# Patient Record
Sex: Female | Born: 1975 | Race: Black or African American | Hispanic: No | Marital: Single | Smoking: Former smoker
Health system: Southern US, Community
[De-identification: ages and names within clinical notes are randomized; demographics above are authoritative.]

## PROBLEM LIST (undated history)

## (undated) DIAGNOSIS — I10 Essential (primary) hypertension: Secondary | ICD-10-CM

## (undated) DIAGNOSIS — D649 Anemia, unspecified: Secondary | ICD-10-CM

## (undated) DIAGNOSIS — K449 Diaphragmatic hernia without obstruction or gangrene: Secondary | ICD-10-CM

## (undated) DIAGNOSIS — J189 Pneumonia, unspecified organism: Secondary | ICD-10-CM

## (undated) DIAGNOSIS — K219 Gastro-esophageal reflux disease without esophagitis: Secondary | ICD-10-CM

## (undated) DIAGNOSIS — F4310 Post-traumatic stress disorder, unspecified: Secondary | ICD-10-CM

## (undated) DIAGNOSIS — F32A Depression, unspecified: Secondary | ICD-10-CM

## (undated) DIAGNOSIS — D573 Sickle-cell trait: Secondary | ICD-10-CM

## (undated) DIAGNOSIS — M1990 Unspecified osteoarthritis, unspecified site: Secondary | ICD-10-CM

## (undated) HISTORY — PX: TUBAL LIGATION: SHX77

## (undated) HISTORY — DX: Gastro-esophageal reflux disease without esophagitis: K21.9

## (undated) HISTORY — PX: HERNIA REPAIR: SHX51

## (undated) HISTORY — PX: TONSILLECTOMY: SUR1361

## (undated) HISTORY — PX: SINUS EXPLORATION: SHX5214

## (undated) HISTORY — DX: Diaphragmatic hernia without obstruction or gangrene: K44.9

---

## 2001-10-03 HISTORY — PX: STOMACH SURGERY: SHX791

## 2011-07-06 ENCOUNTER — Emergency Department (INDEPENDENT_AMBULATORY_CARE_PROVIDER_SITE_OTHER): Payer: Self-pay

## 2011-07-06 ENCOUNTER — Encounter: Payer: Self-pay | Admitting: *Deleted

## 2011-07-06 ENCOUNTER — Emergency Department (HOSPITAL_BASED_OUTPATIENT_CLINIC_OR_DEPARTMENT_OTHER)
Admission: EM | Admit: 2011-07-06 | Discharge: 2011-07-06 | Disposition: A | Discharge status: Home / Self Care | Payer: Self-pay | Attending: Emergency Medicine | Admitting: Emergency Medicine

## 2011-07-06 DIAGNOSIS — J45909 Unspecified asthma, uncomplicated: Secondary | ICD-10-CM | POA: Insufficient documentation

## 2011-07-06 DIAGNOSIS — R05 Cough: Secondary | ICD-10-CM

## 2011-07-06 DIAGNOSIS — J3489 Other specified disorders of nose and nasal sinuses: Secondary | ICD-10-CM

## 2011-07-06 DIAGNOSIS — I10 Essential (primary) hypertension: Secondary | ICD-10-CM | POA: Insufficient documentation

## 2011-07-06 DIAGNOSIS — J40 Bronchitis, not specified as acute or chronic: Secondary | ICD-10-CM | POA: Insufficient documentation

## 2011-07-06 DIAGNOSIS — R079 Chest pain, unspecified: Secondary | ICD-10-CM | POA: Insufficient documentation

## 2011-07-06 HISTORY — DX: Sickle-cell trait: D57.3

## 2011-07-06 HISTORY — DX: Essential (primary) hypertension: I10

## 2011-07-06 MED ORDER — MOXIFLOXACIN HCL 400 MG PO TABS: 400.0000 mg | ORAL_TABLET | Freq: Every day | ORAL | Status: AC

## 2011-07-06 MED ORDER — ALBUTEROL SULFATE (5 MG/ML) 0.5% IN NEBU
2.5000 mg | INHALATION_SOLUTION | Freq: Once | RESPIRATORY_TRACT | Status: AC
  Administered 2011-07-06: 2.5 mg via RESPIRATORY_TRACT
  Filled 2011-07-06: qty 1

## 2011-07-06 MED ORDER — AMOXICILLIN 500 MG PO CAPS: 500.0000 mg | ORAL_CAPSULE | Freq: Three times a day (TID) | ORAL | Status: AC

## 2011-07-06 MED ORDER — IPRATROPIUM BROMIDE 0.02 % IN SOLN
0.5000 mg | Freq: Once | RESPIRATORY_TRACT | Status: AC
  Administered 2011-07-06: 0.5 mg via RESPIRATORY_TRACT
  Filled 2011-07-06: qty 2.5

## 2011-07-06 MED ORDER — ALBUTEROL SULFATE HFA 108 (90 BASE) MCG/ACT IN AERS: 2.0000 | INHALATION_SPRAY | RESPIRATORY_TRACT | Status: DC

## 2011-07-06 MED ORDER — ALBUTEROL SULFATE (5 MG/ML) 0.5% IN NEBU
2.5000 mg | INHALATION_SOLUTION | Freq: Once | RESPIRATORY_TRACT | Status: AC
  Administered 2011-07-06: 2.5 mg via RESPIRATORY_TRACT

## 2011-07-06 MED ORDER — ALBUTEROL SULFATE HFA 108 (90 BASE) MCG/ACT IN AERS
INHALATION_SPRAY | RESPIRATORY_TRACT | Status: AC
  Filled 2011-07-06: qty 6.7

## 2011-07-06 NOTE — ED Provider Notes (Signed)
History     CSN: [id number] Arrival date & time: 07/06/2011  7:13 PM  Chief Complaint  Patient presents with  . URI    (Consider location/radiation/quality/duration/timing/severity/associated sxs/prior treatment) Patient is a 35 y.o. female presenting with cough. The history is provided by the patient. No language interpreter was used.  Cough This is a new problem. The current episode started more than 1 week ago. The problem occurs constantly. The problem has been gradually worsening. The maximum temperature recorded prior to her arrival was 100 to 100.9 F. The fever has been present for 1 to 2 days. Associated symptoms include chest pain, rhinorrhea and sore throat. She has tried cough syrup for the symptoms. The treatment provided no relief. She is not a smoker. Her past medical history is significant for asthma. Her past medical history does not include pneumonia or emphysema.  Pt has been on prednisone x 3 days with no relief.  Pt has a history of asthma.  Past Medical History  Diagnosis Date  . Sickle cell trait   . Asthma   . Hypertension     Past Surgical History  Procedure Date  . Tubal ligation   . Hernia repair     History reviewed. No pertinent family history.  History  Substance Use Topics  . Smoking status: Never Smoker   . Smokeless tobacco: Not on file  . Alcohol Use: No    OB History    Grav Para Term Preterm Abortions TAB SAB Ect Mult Living                  Review of Systems  HENT: Positive for sore throat and rhinorrhea.   Respiratory: Positive for cough.   Cardiovascular: Positive for chest pain.  All other systems reviewed and are negative.    Allergies  Review of patient's allergies indicates no known allergies.  Home Medications   Current Outpatient Rx  Name Route Sig Dispense Refill  . ALBUTEROL SULFATE HFA 108 (90 BASE) MCG/ACT IN AERS Inhalation Inhale 2 puffs into the lungs every 6 (six) hours as needed. For shortness of breath  and wheezing     . CETIRIZINE HCL 10 MG PO TABS Oral Take 10 mg by mouth daily as needed.      Marland Kitchen HYDROCODONE-HOMATROPINE 5-1.5 MG/5ML PO SYRP Oral Take 5 mLs by mouth every 4 (four) hours as needed. For cough     . PHENYLEPHRINE-DM-GG-APAP 5-10-200-325 MG/10ML PO LIQD Oral Take 10 mLs by mouth every 6 (six) hours as needed. For cough     . PREDNISONE 20 MG PO TABS Oral Take 20 mg by mouth 3 (three) times daily.        BP 150/93  Pulse 82  Temp(Src) 98.5 F (36.9 C) (Oral)  Resp 16  Ht 5\' 6"  (1.676 m)  Wt 197 lb (89.359 kg)  BMI 31.80 kg/m2  SpO2 100%  LMP 06/01/2011  Physical Exam  Nursing note and vitals reviewed. Constitutional: She is oriented to person, place, and time. She appears well-developed and well-nourished.  HENT:  Head: Normocephalic and atraumatic.  Right Ear: External ear normal.  Left Ear: External ear normal.  Nose: Nose normal.  Mouth/Throat: Oropharynx is clear and moist.       Tender maxillary sinuses  Eyes: Conjunctivae and EOM are normal. Pupils are equal, round, and reactive to light.  Neck: Normal range of motion. Neck supple.  Cardiovascular: Normal rate.   Pulmonary/Chest: Effort normal and breath sounds normal.  Abdominal: Soft.  Musculoskeletal: Normal range of motion.  Neurological: She is alert and oriented to person, place, and time.  Skin: Skin is warm.  Psychiatric: She has a normal mood and affect.    ED Course  Procedures (including critical care time)  Labs Reviewed - No data to display Dg Chest 2 View  07/06/2011  *RADIOLOGY REPORT*  Clinical Data: Cough  CHEST - 2 VIEW  Comparison: None.  Findings: Cardiomediastinal silhouette is unremarkable.  No acute infiltrate or pleural effusion.  No pulmonary edema.  Bony thorax is unremarkable.  IMPRESSION: No active disease.  Original Report Authenticated By: Natasha Mead, M.D.     No diagnosis found.    MDM  No pneumonia.  I will treat with antibiotics,  Pt advised to continue cough  medication.        Langston Masker, [place] 07/06/11 2129

## 2011-07-06 NOTE — ED Provider Notes (Signed)
Medical screening examination/treatment/procedure(s) were performed by non-physician practitioner and as supervising physician I was immediately available for consultation/collaboration.   Shivaun Bilello, MD 07/06/11 2223 

## 2011-07-06 NOTE — ED Notes (Signed)
Pt c/o URI x 1 week . Seen by  [facility] ED x 3 dayss ago given steriods and cough meds, no relief

## 2011-09-19 ENCOUNTER — Encounter (HOSPITAL_BASED_OUTPATIENT_CLINIC_OR_DEPARTMENT_OTHER): Payer: Self-pay | Admitting: *Deleted

## 2011-09-19 ENCOUNTER — Emergency Department (HOSPITAL_BASED_OUTPATIENT_CLINIC_OR_DEPARTMENT_OTHER)
Admission: EM | Admit: 2011-09-19 | Discharge: 2011-09-19 | Disposition: A | Discharge status: Home / Self Care | Payer: Self-pay | Attending: Emergency Medicine | Admitting: Emergency Medicine

## 2011-09-19 DIAGNOSIS — N390 Urinary tract infection, site not specified: Secondary | ICD-10-CM

## 2011-09-19 DIAGNOSIS — N760 Acute vaginitis: Secondary | ICD-10-CM | POA: Insufficient documentation

## 2011-09-19 DIAGNOSIS — K2970 Gastritis, unspecified, without bleeding: Secondary | ICD-10-CM | POA: Insufficient documentation

## 2011-09-19 DIAGNOSIS — A499 Bacterial infection, unspecified: Secondary | ICD-10-CM | POA: Insufficient documentation

## 2011-09-19 DIAGNOSIS — R109 Unspecified abdominal pain: Secondary | ICD-10-CM | POA: Insufficient documentation

## 2011-09-19 DIAGNOSIS — A599 Trichomoniasis, unspecified: Secondary | ICD-10-CM | POA: Insufficient documentation

## 2011-09-19 DIAGNOSIS — B9689 Other specified bacterial agents as the cause of diseases classified elsewhere: Secondary | ICD-10-CM | POA: Insufficient documentation

## 2011-09-19 HISTORY — DX: Anemia, unspecified: D64.9

## 2011-09-19 LAB — URINALYSIS, ROUTINE W REFLEX MICROSCOPIC
Bilirubin Urine: NEGATIVE
Ketones, ur: NEGATIVE mg/dL
Nitrite: NEGATIVE
Urobilinogen, UA: 0.2 mg/dL (ref 0.0–1.0)

## 2011-09-19 LAB — WET PREP, GENITAL: Yeast Wet Prep HPF POC: NONE SEEN

## 2011-09-19 LAB — CBC
MCH: 23.3 pg — ABNORMAL LOW (ref 26.0–34.0)
MCHC: 32.2 g/dL (ref 30.0–36.0)
Platelets: 232 10*3/uL (ref 150–400)
RDW: 16.7 % — ABNORMAL HIGH (ref 11.5–15.5)

## 2011-09-19 MED ORDER — FAMOTIDINE 10 MG PO TABS: 10.0000 mg | ORAL_TABLET | Freq: Two times a day (BID) | ORAL | Status: DC

## 2011-09-19 MED ORDER — PROMETHAZINE HCL 25 MG PO TABS: 25.0000 mg | ORAL_TABLET | Freq: Four times a day (QID) | ORAL | Status: DC | PRN

## 2011-09-19 MED ORDER — AZITHROMYCIN 250 MG PO TABS
1000.0000 mg | ORAL_TABLET | Freq: Once | ORAL | Status: AC
  Administered 2011-09-19: 1000 mg via ORAL
  Filled 2011-09-19: qty 4

## 2011-09-19 MED ORDER — CEFTRIAXONE SODIUM 250 MG IJ SOLR
250.0000 mg | Freq: Once | INTRAMUSCULAR | Status: AC
  Administered 2011-09-19: 250 mg via INTRAMUSCULAR
  Filled 2011-09-19: qty 250

## 2011-09-19 MED ORDER — METRONIDAZOLE 500 MG PO TABS: 500.0000 mg | ORAL_TABLET | Freq: Two times a day (BID) | ORAL | Status: AC

## 2011-09-19 MED ORDER — CIPROFLOXACIN HCL 500 MG PO TABS: 500.0000 mg | ORAL_TABLET | Freq: Two times a day (BID) | ORAL | Status: AC

## 2011-09-19 MED ORDER — LIDOCAINE HCL (PF) 1 % IJ SOLN
INTRAMUSCULAR | Status: AC
  Administered 2011-09-19: 2 mL
  Filled 2011-09-19: qty 5

## 2011-09-19 NOTE — ED Notes (Signed)
Pt c/o abd pain and lower back and left flank pain

## 2011-09-19 NOTE — ED Provider Notes (Signed)
History     CSN: [id number] Arrival date & time: 09/19/2011  8:29 PM   First MD Initiated Contact with Patient 09/19/11 2053      Chief Complaint  Patient presents with  . Abdominal Pain  . Back Pain    (Consider location/radiation/quality/duration/timing/severity/associated sxs/prior treatment) HPI History provided by pt.   Pt has had intermittent epigastric pain w/ radiation to L flank for the past week.  Aggravated by certain movements.  Associated w/ nausea,  increased urinary freq and abn periods.  Denies fever, vomiting, diarrhea and other GU sx.   No recent trauma or heavy lifting. Drinks alcohol and takes NSAIDs infrequently but drinks a lot of caffeine.  H/o acid reflux and these sx have recently worsened. Past abd surgeries include tubal ligation.    Past Medical History  Diagnosis Date  . Sickle cell trait   . Asthma   . Hypertension   . Anemia     Past Surgical History  Procedure Date  . Tubal ligation   . Hernia repair   . Tonsillectomy     History reviewed. No pertinent family history.  History  Substance Use Topics  . Smoking status: Current Some Day Smoker  . Smokeless tobacco: Not on file  . Alcohol Use: Yes    OB History    Grav Para Term Preterm Abortions TAB SAB Ect Mult Living                  Review of Systems  All other systems reviewed and are negative.    Allergies  Review of patient's allergies indicates no known allergies.  Home Medications   Current Outpatient Rx  Name Route Sig Dispense Refill  . ALBUTEROL SULFATE HFA 108 (90 BASE) MCG/ACT IN AERS Inhalation Inhale 2 puffs into the lungs every 6 (six) hours as needed. For shortness of breath and wheezing     . CETIRIZINE HCL 10 MG PO TABS Oral Take 10 mg by mouth daily as needed. For allergies    . VITAMIN D 1000 UNITS PO TABS Oral Take 1,000 Units by mouth 2 (two) times daily.        BP 144/84  Pulse 96  Temp(Src) 98.3 F (36.8 C) (Oral)  Resp 18  Ht 5\' 6"  (1.676 m)   Wt 190 lb (86.183 kg)  BMI 30.67 kg/m2  SpO2 100%  LMP 08/17/2011  Physical Exam  Nursing note and vitals reviewed. Constitutional: She is oriented to person, place, and time. She appears well-developed and well-nourished. No distress.  HENT:  Head: Normocephalic and atraumatic.  Eyes:       Normal appearance  Neck: Normal range of motion.  Cardiovascular: Normal rate and regular rhythm.   Pulmonary/Chest: Effort normal and breath sounds normal.  Abdominal: Soft. Bowel sounds are normal. She exhibits no distension and no mass. There is no rebound and no guarding.       Mild, diffuse ttp; reported to be worst in epigastrium.  No CVA tenderness  Genitourinary:       Nml external genitalia.  Mod amt thick, Vides vaginal discharge.  No bleeding.  Cervix closed and appears nml.  Diffuse, mild tenderness on bimanual exam.   Neurological: She is alert and oriented to person, place, and time.  Skin: Skin is warm and dry. No rash noted.  Psychiatric: She has a normal mood and affect. Her behavior is normal.    ED Course  Procedures (including critical care time)  Labs Reviewed  URINALYSIS, ROUTINE  W REFLEX MICROSCOPIC - Abnormal; Notable for the following:    APPearance CLOUDY (*)    Leukocytes, UA MODERATE (*)    All other components within normal limits  URINE MICROSCOPIC-ADD ON - Abnormal; Notable for the following:    Squamous Epithelial / LPF FEW (*)    Bacteria, UA FEW (*)    All other components within normal limits  WET PREP, GENITAL - Abnormal; Notable for the following:    Trich, Wet Prep MANY (*)    Clue Cells, Wet Prep MANY (*)    WBC, Wet Prep HPF POC MODERATE (*)    All other components within normal limits  CBC - Abnormal; Notable for the following:    Hemoglobin 10.4 (*)    HCT 32.3 (*)    MCV 72.3 (*)    MCH 23.3 (*)    RDW 16.7 (*)    All other components within normal limits  PREGNANCY, URINE  GC/CHLAMYDIA PROBE AMP, GENITAL   No results found.   1.  Gastritis   2. Bacterial vaginosis   3. Trichomonas   4. UTI (lower urinary tract infection)       MDM  Pt presents w/ c/o epigastric pain w/ radiation to L flank, nausea and urinary frequency.  On exam, afebrile, NAD, abd benign but epigastric ttp, no CVA ttp, vaginal discharge and diffuse, mild tenderness on bimanual.  Labs sig for UTI, BV and trich.  Pyelo unlikely because pt afebrile, no vomiting and no CVA tenderness but pt d/c'd home w/ cipro (pt unable to afford macrobid).  Also prescribed flagyl, promethazine and pepcid.  She received IM rocephin and po zithromax in ED to cover for possible gc/chlam.  Doubt PID b/c no lower abd pain/tenderness, fever, vomiting. Return precautions discussed.         Otilio Miu, [place] 09/20/11 [phone]

## 2011-09-20 LAB — GC/CHLAMYDIA PROBE AMP, GENITAL
Chlamydia, DNA Probe: NEGATIVE
GC Probe Amp, Genital: NEGATIVE

## 2011-09-21 NOTE — ED Provider Notes (Signed)
Medical screening examination/treatment/procedure(s) were performed by non-physician practitioner and as supervising physician I was immediately available for consultation/collaboration.   Gerhard Munch, MD 09/21/11 1359

## 2012-10-05 ENCOUNTER — Emergency Department (HOSPITAL_BASED_OUTPATIENT_CLINIC_OR_DEPARTMENT_OTHER)
Admission: EM | Admit: 2012-10-05 | Discharge: 2012-10-06 | Disposition: A | Discharge status: Other Acute Inpatient Hospital | Payer: Self-pay | Attending: Emergency Medicine | Admitting: Emergency Medicine

## 2012-10-05 ENCOUNTER — Encounter (HOSPITAL_BASED_OUTPATIENT_CLINIC_OR_DEPARTMENT_OTHER): Payer: Self-pay | Admitting: *Deleted

## 2012-10-05 DIAGNOSIS — Z9851 Tubal ligation status: Secondary | ICD-10-CM | POA: Insufficient documentation

## 2012-10-05 DIAGNOSIS — Z3202 Encounter for pregnancy test, result negative: Secondary | ICD-10-CM | POA: Insufficient documentation

## 2012-10-05 DIAGNOSIS — J45909 Unspecified asthma, uncomplicated: Secondary | ICD-10-CM | POA: Insufficient documentation

## 2012-10-05 DIAGNOSIS — Z79899 Other long term (current) drug therapy: Secondary | ICD-10-CM | POA: Insufficient documentation

## 2012-10-05 DIAGNOSIS — F17200 Nicotine dependence, unspecified, uncomplicated: Secondary | ICD-10-CM | POA: Insufficient documentation

## 2012-10-05 DIAGNOSIS — Z862 Personal history of diseases of the blood and blood-forming organs and certain disorders involving the immune mechanism: Secondary | ICD-10-CM | POA: Insufficient documentation

## 2012-10-05 DIAGNOSIS — R110 Nausea: Secondary | ICD-10-CM | POA: Insufficient documentation

## 2012-10-05 DIAGNOSIS — R109 Unspecified abdominal pain: Secondary | ICD-10-CM | POA: Insufficient documentation

## 2012-10-05 DIAGNOSIS — I10 Essential (primary) hypertension: Secondary | ICD-10-CM | POA: Insufficient documentation

## 2012-10-05 DIAGNOSIS — D573 Sickle-cell trait: Secondary | ICD-10-CM | POA: Insufficient documentation

## 2012-10-05 DIAGNOSIS — N390 Urinary tract infection, site not specified: Secondary | ICD-10-CM | POA: Insufficient documentation

## 2012-10-05 MED ORDER — PIPERACILLIN-TAZOBACTAM 3.375 G IVPB 30 MIN
3.3750 g | Freq: Once | INTRAVENOUS | Status: AC
  Administered 2012-10-05: 3.375 g via INTRAVENOUS
  Filled 2012-10-05 (×2): qty 50

## 2012-10-05 MED ORDER — SODIUM CHLORIDE 0.9 % IV BOLUS (SEPSIS)
1000.0000 mL | Freq: Once | INTRAVENOUS | Status: AC
  Administered 2012-10-05: 500 mL via INTRAVENOUS

## 2012-10-05 MED ORDER — ONDANSETRON HCL 4 MG/2ML IJ SOLN
4.0000 mg | Freq: Once | INTRAMUSCULAR | Status: AC
  Administered 2012-10-05: 4 mg via INTRAVENOUS
  Filled 2012-10-05: qty 2

## 2012-10-05 NOTE — ED Notes (Signed)
Pt seen at [facility] 12/31 for UTI given bactrim , called today from [facility] to return  For new Abx , d/t urine culture results , pt stated she didn't want to wait at [facility] tonight

## 2012-10-06 LAB — URINE MICROSCOPIC-ADD ON

## 2012-10-06 LAB — URINALYSIS, ROUTINE W REFLEX MICROSCOPIC
Glucose, UA: NEGATIVE mg/dL
Ketones, ur: NEGATIVE mg/dL
Nitrite: NEGATIVE
Protein, ur: NEGATIVE mg/dL
Urobilinogen, UA: 0.2 mg/dL (ref 0.0–1.0)

## 2012-10-06 LAB — PREGNANCY, URINE: Preg Test, Ur: NEGATIVE

## 2012-10-06 NOTE — ED Notes (Signed)
I called [facility] and spoke with nursing supervisor Jasmine December, she stated that she had not heard from Dr. Heron Nay yet and would give [place] a call back when she talked with the doctor concerning a bed assignment for pt.

## 2012-10-06 NOTE — ED Notes (Addendum)
Recalled over to [facility] and spoke with nursing supervisor Sunday Shams about the status of bed assignment for pt. Stated" the MD just got done with a critical pt and she would take a look". RN/CN aware.

## 2012-10-06 NOTE — ED Provider Notes (Signed)
History     CSN: [id number]  Arrival date & time 10/05/12  2158   First MD Initiated Contact with Patient 10/05/12 2320      Chief Complaint  Patient presents with  . Urinary Tract Infection    (Consider location/radiation/quality/duration/timing/severity/associated sxs/prior treatment) The history is provided by the patient.  Renee Cross is a 37 y.o. female hx of sickle cell trait, HTN here with UTI. She has been feeling nauseous for several days. She also has lower abdominal pain that is intermittent. No flank pain or fever. Went to [facility] several days ago and the Urine culture showed resistant organism. She went to [facility] earlier today and had nl CBC. Still feeling nauseous but no vomiting. She came here after being called to return for IV abx.    Past Medical History  Diagnosis Date  . Sickle cell trait   . Asthma   . Hypertension   . Anemia     Past Surgical History  Procedure Date  . Tubal ligation   . Hernia repair   . Tonsillectomy     History reviewed. No pertinent family history.  History  Substance Use Topics  . Smoking status: Current Some Day Smoker -- 0.5 packs/day    Types: Cigarettes  . Smokeless tobacco: Not on file  . Alcohol Use: Yes    OB History    Grav Para Term Preterm Abortions TAB SAB Ect Mult Living                  Review of Systems  Gastrointestinal: Positive for nausea and abdominal pain.  All other systems reviewed and are negative.    Allergies  Review of patient's allergies indicates no known allergies.  Home Medications   Current Outpatient Rx  Name  Route  Sig  Dispense  Refill  . ALBUTEROL SULFATE HFA 108 (90 BASE) MCG/ACT IN AERS   Inhalation   Inhale 2 puffs into the lungs every 6 (six) hours as needed. For shortness of breath and wheezing          . CETIRIZINE HCL 10 MG PO TABS   Oral   Take 10 mg by mouth daily as needed. For allergies         . VITAMIN D 1000 UNITS PO TABS   Oral  Take 1,000 Units by mouth 2 (two) times daily.           Marland Kitchen FAMOTIDINE 10 MG PO TABS   Oral   Take 1 tablet (10 mg total) by mouth 2 (two) times daily. (for the first 3 days and then as needed)   20 tablet   0     BP 130/79  Pulse 75  Temp 98.1 F (36.7 C) (Oral)  Resp 16  Ht 5\' 6"  (1.676 m)  Wt 190 lb (86.183 kg)  BMI 30.67 kg/m2  SpO2 100%  LMP 10/03/2012  Physical Exam  Nursing note and vitals reviewed. Constitutional: She is oriented to person, place, and time. She appears well-developed and well-nourished.  HENT:  Head: Normocephalic.  Mouth/Throat: Oropharynx is clear and moist.  Eyes: Conjunctivae normal are normal. Pupils are equal, round, and reactive to light.  Neck: Normal range of motion. Neck supple.  Cardiovascular: Normal rate, regular rhythm and normal heart sounds.   Pulmonary/Chest: Breath sounds normal. No respiratory distress. She has no wheezes. She has no rales.  Abdominal: Soft.       Mild suprapubic tenderness, no rebound. No CVAT  Musculoskeletal: Normal range of motion.  Neurological: She is alert and oriented to person, place, and time.  Skin: Skin is warm and dry.  Psychiatric: She has a normal mood and affect. Her behavior is normal. Judgment and thought content normal.    ED Course  Procedures (including critical care time)  Labs Reviewed  URINALYSIS, ROUTINE W REFLEX MICROSCOPIC - Abnormal; Notable for the following:    Hgb urine dipstick MODERATE (*)     Leukocytes, UA LARGE (*)     All other components within normal limits  URINE MICROSCOPIC-ADD ON - Abnormal; Notable for the following:    Squamous Epithelial / LPF FEW (*)     Bacteria, UA MANY (*)     All other components within normal limits  PREGNANCY, URINE  URINE CULTURE   No results found.   No diagnosis found.    MDM  Renee Cross is a 37 y.o. female here with UTI. I reviewed the Urine culture from [facility] and the labs. Urine culture showed ESBL >  100,000 that is sensitive to zosyn. She is placed on zosyn. I called Dr. Heron Nay, who accepted the patient on med/surg at [facility].         Richardean Canal, MD 10/06/12 [phone]

## 2012-10-06 NOTE — ED Notes (Signed)
IV not actually removed. Pt transported out of the [facility] system with IV in place. IV removed in chart for charting purposes only.

## 2012-10-07 LAB — URINE CULTURE: Colony Count: 15000

## 2013-10-26 ENCOUNTER — Encounter (HOSPITAL_BASED_OUTPATIENT_CLINIC_OR_DEPARTMENT_OTHER): Payer: Self-pay | Admitting: Emergency Medicine

## 2013-10-26 ENCOUNTER — Emergency Department (HOSPITAL_BASED_OUTPATIENT_CLINIC_OR_DEPARTMENT_OTHER): Payer: Self-pay

## 2013-10-26 ENCOUNTER — Emergency Department (HOSPITAL_BASED_OUTPATIENT_CLINIC_OR_DEPARTMENT_OTHER)
Admission: EM | Admit: 2013-10-26 | Discharge: 2013-10-27 | Disposition: A | Discharge status: Home / Self Care | Payer: Self-pay | Attending: Emergency Medicine | Admitting: Emergency Medicine

## 2013-10-26 DIAGNOSIS — J111 Influenza due to unidentified influenza virus with other respiratory manifestations: Secondary | ICD-10-CM | POA: Insufficient documentation

## 2013-10-26 DIAGNOSIS — J45901 Unspecified asthma with (acute) exacerbation: Secondary | ICD-10-CM | POA: Insufficient documentation

## 2013-10-26 DIAGNOSIS — I10 Essential (primary) hypertension: Secondary | ICD-10-CM | POA: Insufficient documentation

## 2013-10-26 DIAGNOSIS — Z79899 Other long term (current) drug therapy: Secondary | ICD-10-CM | POA: Insufficient documentation

## 2013-10-26 DIAGNOSIS — Z862 Personal history of diseases of the blood and blood-forming organs and certain disorders involving the immune mechanism: Secondary | ICD-10-CM | POA: Insufficient documentation

## 2013-10-26 DIAGNOSIS — Z791 Long term (current) use of non-steroidal anti-inflammatories (NSAID): Secondary | ICD-10-CM | POA: Insufficient documentation

## 2013-10-26 DIAGNOSIS — F17200 Nicotine dependence, unspecified, uncomplicated: Secondary | ICD-10-CM | POA: Insufficient documentation

## 2013-10-26 LAB — CBC WITH DIFFERENTIAL/PLATELET
Basophils Absolute: 0 10*3/uL (ref 0.0–0.1)
Basophils Relative: 0 % (ref 0–1)
Eosinophils Absolute: 0.1 10*3/uL (ref 0.0–0.7)
Eosinophils Relative: 1 % (ref 0–5)
HCT: 34 % — ABNORMAL LOW (ref 36.0–46.0)
Hemoglobin: 11 g/dL — ABNORMAL LOW (ref 12.0–15.0)
Lymphocytes Relative: 20 % (ref 12–46)
Lymphs Abs: 1.5 10*3/uL (ref 0.7–4.0)
MCH: 24.4 pg — AB (ref 26.0–34.0)
MCHC: 32.4 g/dL (ref 30.0–36.0)
MCV: 75.4 fL — ABNORMAL LOW (ref 78.0–100.0)
MONO ABS: 0.8 10*3/uL (ref 0.1–1.0)
Monocytes Relative: 11 % (ref 3–12)
NEUTROS PCT: 68 % (ref 43–77)
Neutro Abs: 5 10*3/uL (ref 1.7–7.7)
PLATELETS: 226 10*3/uL (ref 150–400)
RBC: 4.51 MIL/uL (ref 3.87–5.11)
RDW: 17.4 % — ABNORMAL HIGH (ref 11.5–15.5)
WBC: 7.4 10*3/uL (ref 4.0–10.5)

## 2013-10-26 LAB — BASIC METABOLIC PANEL
BUN: 13 mg/dL (ref 6–23)
CALCIUM: 8.8 mg/dL (ref 8.4–10.5)
CO2: 21 mEq/L (ref 19–32)
Chloride: 103 mEq/L (ref 96–112)
Creatinine, Ser: 1 mg/dL (ref 0.50–1.10)
GFR calc Af Amer: 82 mL/min — ABNORMAL LOW (ref >90)
GFR, EST NON AFRICAN AMERICAN: 71 mL/min — AB (ref >90)
Glucose, Bld: 97 mg/dL (ref 70–99)
Potassium: 3.8 mEq/L (ref 3.7–5.3)
Sodium: 140 mEq/L (ref 137–147)

## 2013-10-26 MED ORDER — SODIUM CHLORIDE 0.9 % IV BOLUS (SEPSIS)
1000.0000 mL | Freq: Once | INTRAVENOUS | Status: AC
  Administered 2013-10-26: 1000 mL via INTRAVENOUS

## 2013-10-26 MED ORDER — KETOROLAC TROMETHAMINE 30 MG/ML IJ SOLN
30.0000 mg | Freq: Once | INTRAMUSCULAR | Status: AC
  Administered 2013-10-26: 30 mg via INTRAVENOUS
  Filled 2013-10-26: qty 1

## 2013-10-26 MED ORDER — ONDANSETRON HCL 4 MG/2ML IJ SOLN
4.0000 mg | Freq: Once | INTRAMUSCULAR | Status: AC
  Administered 2013-10-26: 4 mg via INTRAVENOUS
  Filled 2013-10-26: qty 2

## 2013-10-26 MED ORDER — SODIUM CHLORIDE 0.9 % IV SOLN: INTRAVENOUS | Status: DC

## 2013-10-26 MED ORDER — PROMETHAZINE HCL 25 MG PO TABS: 25.0000 mg | ORAL_TABLET | Freq: Four times a day (QID) | ORAL | Status: DC | PRN

## 2013-10-26 MED ORDER — NAPROXEN 500 MG PO TABS: 500.0000 mg | ORAL_TABLET | Freq: Two times a day (BID) | ORAL | Status: DC

## 2013-10-26 MED ORDER — DM-GUAIFENESIN ER 30-600 MG PO TB12: 1.0000 | ORAL_TABLET | Freq: Two times a day (BID) | ORAL | Status: DC

## 2013-10-26 MED ORDER — ALBUTEROL SULFATE HFA 108 (90 BASE) MCG/ACT IN AERS
2.0000 | INHALATION_SPRAY | Freq: Four times a day (QID) | RESPIRATORY_TRACT | Status: DC
  Administered 2013-10-27: 2 via RESPIRATORY_TRACT

## 2013-10-26 NOTE — Discharge Instructions (Signed)
Symptoms consistent with influenza-type symptoms. Beyond the window for affective numbness of Tamiflu. Patient is a Therapist, sports work note provided. Use the albuterol inhaler 2 puffs every 6 hours for the next 7 days as well to call. Take Mucinex DM every 12 hours as well for cough and the mucus. Take the Naprosyn for the bodyaches. Return for any new or worse symptoms. Rest and increase fluids is much as possible. Stay at home.

## 2013-10-26 NOTE — ED Notes (Signed)
Pt reports generalized body aches, vomiting, diarrhea x several days.

## 2013-10-26 NOTE — ED Provider Notes (Signed)
CSN: [id number]     Arrival date & time 10/26/13  1933 History  This chart was scribed for Shelda Jakes, MD by Ronal Fear, ED Scribe. This patient was seen in room MH10/MH10 and the patient's care was started at 11:45 PM.    Chief Complaint  Patient presents with  . Generalized Body Aches   (Consider location/radiation/quality/duration/timing/severity/associated sxs/prior Treatment) HPI HPI Comments: Renee Cross is a 38 y.o. female who presents to the Emergency Department complaining of sudden onset vomitting and diarrhea 2x days ago that started as body aches and cough. She took aleve and dayquil with no relief.  The symptoms got worse 1x day ago. She has vomited 3 times 1x day ago with 3 episodes of diarrhea with no blood. She vomited 2x today and 2 episodes of diarrhea. She also complains fo coughing that has grown worse over the past 2x days.   No PCP Per Patient  Past Medical History  Diagnosis Date  . Sickle cell trait   . Asthma   . Hypertension   . Anemia    Past Surgical History  Procedure Laterality Date  . Tubal ligation    . Hernia repair    . Tonsillectomy     History reviewed. No pertinent family history. History  Substance Use Topics  . Smoking status: Current Some Day Smoker -- 0.50 packs/day    Types: Cigarettes  . Smokeless tobacco: Not on file  . Alcohol Use: Yes   OB History   Grav Para Term Preterm Abortions TAB SAB Ect Mult Living                 Review of Systems  Constitutional: Positive for chills. Negative for fever.  HENT: Positive for congestion. Negative for rhinorrhea and sore throat.   Eyes: Negative for visual disturbance.  Respiratory: Positive for cough and shortness of breath.   Cardiovascular: Negative for chest pain.  Gastrointestinal: Positive for nausea, vomiting and diarrhea. Negative for abdominal pain.  Genitourinary: Negative for dysuria.  Musculoskeletal: Positive for myalgias. Negative for joint swelling.  Skin:  Negative for rash.  Neurological: Positive for headaches.  Hematological: Does not bruise/bleed easily.  Psychiatric/Behavioral: Negative for confusion.  All other systems reviewed and are negative.    Allergies  Review of patient's allergies indicates no known allergies.  Home Medications   Current Outpatient Rx  Name  Route  Sig  Dispense  Refill  . albuterol (PROVENTIL HFA;VENTOLIN HFA) 108 (90 BASE) MCG/ACT inhaler   Inhalation   Inhale 2 puffs into the lungs every 6 (six) hours as needed. For shortness of breath and wheezing          . cetirizine (ZYRTEC) 10 MG tablet   Oral   Take 10 mg by mouth daily as needed. For allergies         . cholecalciferol (VITAMIN D) 1000 UNITS tablet   Oral   Take 1,000 Units by mouth 2 (two) times daily.           Marland Kitchen dextromethorphan-guaiFENesin (MUCINEX DM) 30-600 MG per 12 hr tablet   Oral   Take 1 tablet by mouth 2 (two) times daily.   28 tablet   0   . EXPIRED: famotidine (PEPCID AC) 10 MG tablet   Oral   Take 1 tablet (10 mg total) by mouth 2 (two) times daily. (for the first 3 days and then as needed)   20 tablet   0   . naproxen (NAPROSYN) 500 MG tablet  Oral   Take 1 tablet (500 mg total) by mouth 2 (two) times daily.   14 tablet   0   . EXPIRED: promethazine (PHENERGAN) 25 MG tablet   Oral   Take 1 tablet (25 mg total) by mouth every 6 (six) hours as needed for nausea.   20 tablet   0   . promethazine (PHENERGAN) 25 MG tablet   Oral   Take 1 tablet (25 mg total) by mouth every 6 (six) hours as needed for nausea or vomiting.   20 tablet   0    BP 145/87  Pulse 105  Temp(Src) 98.5 F (36.9 C) (Oral)  Resp 18  Ht 5\' 6"  (1.676 m)  Wt 195 lb (88.451 kg)  BMI 31.49 kg/m2  SpO2 100% Physical Exam  Nursing note and vitals reviewed. Constitutional: She is oriented to person, place, and time. She appears well-developed and well-nourished. No distress.  HENT:  Head: Normocephalic and atraumatic.  Eyes:  EOM are normal.  Neck: Neck supple. No tracheal deviation present.  Cardiovascular: Normal rate, regular rhythm and normal heart sounds.   Pulmonary/Chest: Effort normal and breath sounds normal. No respiratory distress.  Abdominal: Soft. Bowel sounds are normal. There is no tenderness.  Musculoskeletal: Normal range of motion. She exhibits no edema.  Neurological: She is alert and oriented to person, place, and time. No cranial nerve deficit. She exhibits normal muscle tone. Coordination normal.  Skin: Skin is warm and dry.  Psychiatric: She has a normal mood and affect. Her behavior is normal.    ED Course  Procedures (including critical care time)  DIAGNOSTIC STUDIES: Oxygen Saturation is 100% on RA, normal by my interpretation.    COORDINATION OF CARE: 10:58 PM- Pt advised of plan for treatment including anti emetics and pain medication with chest X-ray and pt agrees.    Labs Review Labs Reviewed  CBC WITH DIFFERENTIAL - Abnormal; Notable for the following:    Hemoglobin 11.0 (*)    HCT 34.0 (*)    MCV 75.4 (*)    MCH 24.4 (*)    RDW 17.4 (*)    All other components within normal limits  BASIC METABOLIC PANEL   Results for orders placed during the hospital encounter of 10/26/13  CBC WITH DIFFERENTIAL      Result Value Range   WBC PENDING  4.0 - 10.5 K/uL   RBC 4.51  3.87 - 5.11 MIL/uL   Hemoglobin 11.0 (*) 12.0 - 15.0 g/dL   HCT 10/28/13 (*) 25.4 - 27.0 %   MCV 75.4 (*) 78.0 - 100.0 fL   MCH 24.4 (*) 26.0 - 34.0 pg   MCHC 32.4  30.0 - 36.0 g/dL   RDW 62.3 (*) 76.2 - 83.1 %   Platelets PENDING  150 - 400 K/uL   Neutrophils Relative % PENDING  43 - 77 %   Neutro Abs PENDING  1.7 - 7.7 K/uL   Band Neutrophils PENDING  0 - 10 %   Lymphocytes Relative PENDING  12 - 46 %   Lymphs Abs PENDING  0.7 - 4.0 K/uL   Monocytes Relative PENDING  3 - 12 %   Monocytes Absolute PENDING  0.1 - 1.0 K/uL   Eosinophils Relative PENDING  0 - 5 %   Eosinophils Absolute PENDING  0.0 - 0.7  K/uL   Basophils Relative PENDING  0 - 1 %   Basophils Absolute PENDING  0.0 - 0.1 K/uL   WBC Morphology PENDING  RBC Morphology PENDING     Smear Review PENDING     nRBC PENDING  0 /100 WBC   Metamyelocytes Relative PENDING     Myelocytes PENDING     Promyelocytes Absolute PENDING     Blasts PENDING      Imaging Review No results found.  EKG Interpretation   None       MDM   1. Influenza    Symptoms consistent with influenza. Patient is beyond the window for Tamiflu to be effective. Patient nontoxic no acute distress. Patient with fluid hydration here. Chest x-ray pending. Basic labs ordered for baseline. Patient will be given albuterol inhaler 2 take home and use for the next 7 days. Also prescription for Mucinex DM. Prescription for Naprosyn. Prescription for Phenergan for the nausea and vomiting. Patient given precautions. 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    I personally performed the services described in this documentation, which was scribed in my presence. The recorded information has been reviewed and is accurate.    Shelda Jakes, MD 10/26/13 [phone]

## 2013-10-26 NOTE — ED Provider Notes (Signed)
Nursing notes and vitals signs, including pulse oximetry, reviewed.  Summary of this visit's results, reviewed by myself:  Labs:  Results for orders placed during the hospital encounter of 10/26/13 (from the past 24 hour(s))  CBC WITH DIFFERENTIAL     Status: Abnormal   Collection Time    10/26/13 11:20 PM      Result Value Range   WBC 7.4  4.0 - 10.5 K/uL   RBC 4.51  3.87 - 5.11 MIL/uL   Hemoglobin 11.0 (*) 12.0 - 15.0 g/dL   HCT 33.8 (*) 25.0 - 53.9 %   MCV 75.4 (*) 78.0 - 100.0 fL   MCH 24.4 (*) 26.0 - 34.0 pg   MCHC 32.4  30.0 - 36.0 g/dL   RDW 76.7 (*) 34.1 - 93.7 %   Platelets 226  150 - 400 K/uL   Neutrophils Relative % 68  43 - 77 %   Lymphocytes Relative 20  12 - 46 %   Monocytes Relative 11  3 - 12 %   Eosinophils Relative 1  0 - 5 %   Basophils Relative 0  0 - 1 %   Neutro Abs 5.0  1.7 - 7.7 K/uL   Lymphs Abs 1.5  0.7 - 4.0 K/uL   Monocytes Absolute 0.8  0.1 - 1.0 K/uL   Eosinophils Absolute 0.1  0.0 - 0.7 K/uL   Basophils Absolute 0.0  0.0 - 0.1 K/uL   RBC Morphology POLYCHROMASIA PRESENT     WBC Morphology Kimmey COUNT CONFIRMED ON SMEAR     Smear Review LARGE PLATELETS PRESENT    BASIC METABOLIC PANEL     Status: Abnormal   Collection Time    10/26/13 11:20 PM      Result Value Range   Sodium 140  137 - 147 mEq/L   Potassium 3.8  3.7 - 5.3 mEq/L   Chloride 103  96 - 112 mEq/L   CO2 21  19 - 32 mEq/L   Glucose, Bld 97  70 - 99 mg/dL   BUN 13  6 - 23 mg/dL   Creatinine, Ser 9.02  0.50 - 1.10 mg/dL   Calcium 8.8  8.4 - 40.9 mg/dL   GFR calc non Af Amer 71 (*) >90 mL/min   GFR calc Af Amer 82 (*) >90 mL/min    Imaging Studies: Dg Chest 2 View  10/26/2013   CLINICAL DATA:  Sudden onset of vomiting and diarrhea  EXAM: CHEST  2 VIEW  COMPARISON:  07/06/2011  FINDINGS: The heart size and mediastinal contours are within normal limits. Both lungs are clear. The visualized skeletal structures are unremarkable.  IMPRESSION: No active cardiopulmonary disease.    Electronically Signed   By: Signa Kell M.D.   On: 10/26/2013 23:48      Hanley Seamen, MD 10/26/13 2357

## 2013-10-27 MED ORDER — ALBUTEROL SULFATE HFA 108 (90 BASE) MCG/ACT IN AERS
INHALATION_SPRAY | RESPIRATORY_TRACT | Status: AC
  Administered 2013-10-27: 2 via RESPIRATORY_TRACT
  Filled 2013-10-27: qty 6.7

## 2014-03-28 ENCOUNTER — Encounter (HOSPITAL_BASED_OUTPATIENT_CLINIC_OR_DEPARTMENT_OTHER): Payer: Self-pay | Admitting: Emergency Medicine

## 2014-03-28 ENCOUNTER — Emergency Department (HOSPITAL_BASED_OUTPATIENT_CLINIC_OR_DEPARTMENT_OTHER): Payer: Self-pay

## 2014-03-28 ENCOUNTER — Emergency Department (HOSPITAL_BASED_OUTPATIENT_CLINIC_OR_DEPARTMENT_OTHER)
Admission: EM | Admit: 2014-03-28 | Discharge: 2014-03-28 | Disposition: A | Discharge status: Other Acute Inpatient Hospital | Payer: Self-pay | Attending: Emergency Medicine | Admitting: Emergency Medicine

## 2014-03-28 DIAGNOSIS — J45909 Unspecified asthma, uncomplicated: Secondary | ICD-10-CM | POA: Insufficient documentation

## 2014-03-28 DIAGNOSIS — A5900 Urogenital trichomoniasis, unspecified: Secondary | ICD-10-CM | POA: Insufficient documentation

## 2014-03-28 DIAGNOSIS — R1084 Generalized abdominal pain: Secondary | ICD-10-CM | POA: Insufficient documentation

## 2014-03-28 DIAGNOSIS — F17200 Nicotine dependence, unspecified, uncomplicated: Secondary | ICD-10-CM | POA: Insufficient documentation

## 2014-03-28 DIAGNOSIS — Z3202 Encounter for pregnancy test, result negative: Secondary | ICD-10-CM | POA: Insufficient documentation

## 2014-03-28 DIAGNOSIS — N12 Tubulo-interstitial nephritis, not specified as acute or chronic: Secondary | ICD-10-CM | POA: Insufficient documentation

## 2014-03-28 DIAGNOSIS — D72829 Elevated white blood cell count, unspecified: Secondary | ICD-10-CM | POA: Insufficient documentation

## 2014-03-28 DIAGNOSIS — I10 Essential (primary) hypertension: Secondary | ICD-10-CM | POA: Insufficient documentation

## 2014-03-28 DIAGNOSIS — Z862 Personal history of diseases of the blood and blood-forming organs and certain disorders involving the immune mechanism: Secondary | ICD-10-CM | POA: Insufficient documentation

## 2014-03-28 DIAGNOSIS — IMO0001 Reserved for inherently not codable concepts without codable children: Secondary | ICD-10-CM | POA: Insufficient documentation

## 2014-03-28 LAB — CBC WITH DIFFERENTIAL/PLATELET
BASOS ABS: 0 10*3/uL (ref 0.0–0.1)
BASOS PCT: 0 % (ref 0–1)
Eosinophils Absolute: 0.1 10*3/uL (ref 0.0–0.7)
Eosinophils Relative: 1 % (ref 0–5)
HCT: 29.3 % — ABNORMAL LOW (ref 36.0–46.0)
Hemoglobin: 9.6 g/dL — ABNORMAL LOW (ref 12.0–15.0)
Lymphocytes Relative: 9 % — ABNORMAL LOW (ref 12–46)
Lymphs Abs: 1.1 10*3/uL (ref 0.7–4.0)
MCH: 24.4 pg — ABNORMAL LOW (ref 26.0–34.0)
MCHC: 32.8 g/dL (ref 30.0–36.0)
MCV: 74.4 fL — ABNORMAL LOW (ref 78.0–100.0)
MONO ABS: 0.9 10*3/uL (ref 0.1–1.0)
Monocytes Relative: 8 % (ref 3–12)
NEUTROS ABS: 9.6 10*3/uL — AB (ref 1.7–7.7)
Neutrophils Relative %: 82 % — ABNORMAL HIGH (ref 43–77)
Platelets: 244 10*3/uL (ref 150–400)
RBC: 3.94 MIL/uL (ref 3.87–5.11)
RDW: 17.8 % — AB (ref 11.5–15.5)
WBC: 11.6 10*3/uL — ABNORMAL HIGH (ref 4.0–10.5)

## 2014-03-28 LAB — URINALYSIS, ROUTINE W REFLEX MICROSCOPIC
Bilirubin Urine: NEGATIVE
GLUCOSE, UA: NEGATIVE mg/dL
Ketones, ur: NEGATIVE mg/dL
Nitrite: NEGATIVE
PH: 7.5 (ref 5.0–8.0)
Protein, ur: NEGATIVE mg/dL
SPECIFIC GRAVITY, URINE: 1.012 (ref 1.005–1.030)
Urobilinogen, UA: 1 mg/dL (ref 0.0–1.0)

## 2014-03-28 LAB — COMPREHENSIVE METABOLIC PANEL
ALBUMIN: 3.8 g/dL (ref 3.5–5.2)
ALT: 28 U/L (ref 0–35)
AST: 31 U/L (ref 0–37)
Alkaline Phosphatase: 86 U/L (ref 39–117)
BUN: 9 mg/dL (ref 6–23)
CO2: 25 mEq/L (ref 19–32)
CREATININE: 0.9 mg/dL (ref 0.50–1.10)
Calcium: 9 mg/dL (ref 8.4–10.5)
Chloride: 103 mEq/L (ref 96–112)
GFR calc Af Amer: >90 mL/min (ref >90)
GFR calc non Af Amer: 81 mL/min — ABNORMAL LOW (ref >90)
Glucose, Bld: 107 mg/dL — ABNORMAL HIGH (ref 70–99)
POTASSIUM: 4.1 meq/L (ref 3.7–5.3)
Sodium: 140 mEq/L (ref 137–147)
Total Bilirubin: 0.4 mg/dL (ref 0.3–1.2)
Total Protein: 7.2 g/dL (ref 6.0–8.3)

## 2014-03-28 LAB — URINE MICROSCOPIC-ADD ON

## 2014-03-28 LAB — LIPASE, BLOOD: Lipase: 44 U/L (ref 11–59)

## 2014-03-28 LAB — PREGNANCY, URINE: PREG TEST UR: NEGATIVE

## 2014-03-28 LAB — RPR

## 2014-03-28 LAB — HIV ANTIBODY (ROUTINE TESTING W REFLEX): HIV 1&2 Ab, 4th Generation: NONREACTIVE

## 2014-03-28 MED ORDER — METOCLOPRAMIDE HCL 5 MG/ML IJ SOLN
10.0000 mg | Freq: Once | INTRAMUSCULAR | Status: AC
  Administered 2014-03-28: 10 mg via INTRAVENOUS
  Filled 2014-03-28: qty 2

## 2014-03-28 MED ORDER — MORPHINE SULFATE 4 MG/ML IJ SOLN
4.0000 mg | Freq: Once | INTRAMUSCULAR | Status: AC
  Administered 2014-03-28: 4 mg via INTRAVENOUS
  Filled 2014-03-28: qty 1

## 2014-03-28 MED ORDER — PIPERACILLIN-TAZOBACTAM 3.375 G IVPB 30 MIN
3.3750 g | Freq: Once | INTRAVENOUS | Status: AC
  Administered 2014-03-28: 3.375 g via INTRAVENOUS
  Filled 2014-03-28 (×2): qty 50

## 2014-03-28 MED ORDER — ACETAMINOPHEN 325 MG PO TABS
650.0000 mg | ORAL_TABLET | Freq: Once | ORAL | Status: AC
  Administered 2014-03-28: 650 mg via ORAL
  Filled 2014-03-28: qty 2

## 2014-03-28 MED ORDER — ONDANSETRON HCL 4 MG/2ML IJ SOLN
4.0000 mg | Freq: Once | INTRAMUSCULAR | Status: AC
  Administered 2014-03-28: 4 mg via INTRAVENOUS
  Filled 2014-03-28: qty 2

## 2014-03-28 MED ORDER — SODIUM CHLORIDE 0.9 % IV BOLUS (SEPSIS)
1000.0000 mL | Freq: Once | INTRAVENOUS | Status: AC
  Administered 2014-03-28: 1000 mL via INTRAVENOUS

## 2014-03-28 MED ORDER — IOHEXOL 300 MG/ML  SOLN
50.0000 mL | Freq: Once | INTRAMUSCULAR | Status: AC | PRN
  Administered 2014-03-28: 50 mL via ORAL

## 2014-03-28 MED ORDER — IOHEXOL 300 MG/ML  SOLN
100.0000 mL | Freq: Once | INTRAMUSCULAR | Status: AC | PRN
  Administered 2014-03-28: 100 mL via INTRAVENOUS

## 2014-03-28 NOTE — ED Provider Notes (Signed)
CSN: [id number]     Arrival date & time 03/28/14  1008 History   First MD Initiated Contact with Patient 03/28/14 1013     Chief Complaint  Patient presents with  . Emesis     (Consider location/radiation/quality/duration/timing/severity/associated sxs/prior Treatment) HPI Comments: Patient presents to the ER for evaluation of abdominal pain. Patient reports that she started to notice lower back pain yesterday. Since then she has developed generalized myalgias, headache, feeling feverish with diffuse abdominal pain, nausea and vomiting. She has not had any diarrhea. Pain is crampy and although her abdomen.  Patient is a 38 y.o. female presenting with abdominal pain.  Abdominal Pain Associated symptoms: nausea and vomiting     Past Medical History  Diagnosis Date  . Sickle cell trait   . Asthma   . Hypertension   . Anemia    Past Surgical History  Procedure Laterality Date  . Tubal ligation    . Hernia repair    . Tonsillectomy     No family history on file. History  Substance Use Topics  . Smoking status: Current Some Day Smoker -- 0.50 packs/day    Types: Cigarettes  . Smokeless tobacco: Not on file  . Alcohol Use: Yes   OB History   Grav Para Term Preterm Abortions TAB SAB Ect Mult Living                 Review of Systems  Gastrointestinal: Positive for nausea, vomiting and abdominal pain.  Musculoskeletal: Positive for back pain and myalgias.  Neurological: Positive for headaches.  All other systems reviewed and are negative.     Allergies  Review of patient's allergies indicates no known allergies.  Home Medications   Prior to Admission medications   Medication Sig Start Date End Date Taking? Authorizing Provider  albuterol (PROVENTIL HFA;VENTOLIN HFA) 108 (90 BASE) MCG/ACT inhaler Inhale 2 puffs into the lungs every 6 (six) hours as needed. For shortness of breath and wheezing     Historical Provider, MD  cetirizine (ZYRTEC) 10 MG tablet Take 10 mg by  mouth daily as needed. For allergies    Historical Provider, MD   BP 160/72  Pulse 75  Temp(Src) 102.1 F (38.9 C) (Oral)  Resp 18  SpO2 100%  LMP 03/21/2014 Physical Exam  Constitutional: She is oriented to person, place, and time. She appears well-developed and well-nourished. No distress.  HENT:  Head: Normocephalic and atraumatic.  Right Ear: Hearing normal.  Left Ear: Hearing normal.  Nose: Nose normal.  Mouth/Throat: Oropharynx is clear and moist and mucous membranes are normal.  Eyes: Conjunctivae and EOM are normal. Pupils are equal, round, and reactive to light.  Neck: Normal range of motion. Neck supple.  Cardiovascular: Regular rhythm, S1 normal and S2 normal.  Exam reveals no gallop and no friction rub.   No murmur heard. Pulmonary/Chest: Effort normal and breath sounds normal. No respiratory distress. She exhibits no tenderness.  Abdominal: Soft. Normal appearance and bowel sounds are normal. There is no hepatosplenomegaly. There is generalized tenderness. There is no rebound, no guarding, no tenderness at McBurney's point and negative Murphy's sign. No hernia.  Genitourinary: Vagina normal. There is no rash or tenderness on the right labia. There is no rash or tenderness on the left labia. Cervix exhibits no motion tenderness.  Musculoskeletal: Normal range of motion.  Neurological: She is alert and oriented to person, place, and time. She has normal strength. No cranial nerve deficit or sensory deficit. Coordination normal. GCS eye  subscore is 4. GCS verbal subscore is 5. GCS motor subscore is 6.  Skin: Skin is warm, dry and intact. No rash noted. No cyanosis.  Psychiatric: She has a normal mood and affect. Her speech is normal and behavior is normal. Thought content normal.    ED Course  Procedures (including critical care time) Labs Review Labs Reviewed  CBC WITH DIFFERENTIAL - Abnormal; Notable for the following:    WBC 11.6 (*)    Hemoglobin 9.6 (*)    HCT 29.3  (*)    MCV 74.4 (*)    MCH 24.4 (*)    RDW 17.8 (*)    Neutrophils Relative % 82 (*)    Neutro Abs 9.6 (*)    Lymphocytes Relative 9 (*)    All other components within normal limits  COMPREHENSIVE METABOLIC PANEL - Abnormal; Notable for the following:    Glucose, Bld 107 (*)    GFR calc non Af Amer 81 (*)    All other components within normal limits  URINALYSIS, ROUTINE W REFLEX MICROSCOPIC - Abnormal; Notable for the following:    APPearance CLOUDY (*)    Hgb urine dipstick TRACE (*)    Leukocytes, UA SMALL (*)    All other components within normal limits  URINE MICROSCOPIC-ADD ON - Abnormal; Notable for the following:    Squamous Epithelial / LPF FEW (*)    Bacteria, UA MANY (*)    All other components within normal limits  URINE CULTURE  GC/CHLAMYDIA PROBE AMP  CULTURE, BLOOD (ROUTINE X 2)  CULTURE, BLOOD (ROUTINE X 2)  LIPASE, BLOOD  PREGNANCY, URINE  RPR  HIV ANTIBODY (ROUTINE TESTING)    Imaging Review Ct Abdomen Pelvis W Contrast  03/28/2014   ADDENDUM REPORT: 03/28/2014 12:33  ADDENDUM: This study was compared to a prior CT of the abdomen and pelvis dated 10/08/2012.   Electronically Signed   By: Salome Holmes M.D.   On: 03/28/2014 12:33   03/28/2014   CLINICAL DATA:  abd pain  EXAM: CT ABDOMEN AND PELVIS WITH CONTRAST  TECHNIQUE: Multidetector CT imaging of the abdomen and pelvis was performed using the standard protocol following bolus administration of intravenous contrast.  CONTRAST:  55mL OMNIPAQUE IOHEXOL 300 MG/ML SOLN, OMNIPAQUE IOHEXOL 300 MG/ML SOLN  COMPARISON:  None.  FINDINGS: Lung bases are unremarkable.  The liver, spleen, adrenals, pancreas, left kidney are unremarkable. A nonenhancing 2.5 cm simple cyst right kidney. Right kidney otherwise unremarkable.  No abdominal aortic aneurysm. The celiac, SMA, IMA, portal vein, SMV are opacified.  No abdominal or pelvic masses, loculated fluid collections, nor adenopathy. Small amount of free fluid in the  dependent portion of the pelvis. No abdominal free fluid.  No abdominal wall or inguinal hernia. No aggressive appearing osseous lesions.  The bowel is negative.  Gallbladder fossa is negative.  IMPRESSION: Small amount of free fluid within the dependent portion the pelvis likely physiologic.  Benign Bosniak type 1 cyst right kidney.  Otherwise no evidence of abdominal or pelvic pathology.  Electronically Signed: By: Salome Holmes M.D. On: 03/28/2014 12:25     EKG Interpretation None      MDM   Final diagnoses:  Pyelonephritis  Trichomoniasis, urogenital   Patient presents to the ER for evaluation of fever, chills, back pain, abdominal pain. Patient does not appear septic upon arrival to the ER. She has a mild leukocytosis, normal comprehensive panel. Patient was complaining of lower abdominal discomfort, examination revealed diffuse tenderness. She did not have  focal tenderness in the right upper quadrant, LFTs are normal. Low suspicion for gallbladder disease. Urinalysis to suggest infection, however.  CT scan performed and does not show any acute pathology. Reviewing the patient's records reveals that she does have a history of UTI/pyelonephritis in January 2014. At that time she was diagnosed with urinary tract infection and initiated on outpatient treatment. Cultures ultimately showed very resistant Escherichia coli and she ended up hospitalized for 4 or 5 days at [facility] on IV Zosyn. The patient's urinalysis appears similar today to what she showed a time. A culture of the urine has been sent. Blood culture has been sent.   Patient's urine did show trichomonas in addition to sign of infection. Based on this, pelvic exam was performed to rule out PID as a cause for her fever and acute illness. She did not have any significant discharge and there was no cervical motion tenderness. GC and Chlamydia pending. PID felt to be unlikely based on the exam.  Based on the history of  resistant pathogens, Zosyn were initiated and patient will be admitted to [facility] for further management.    Gilda Crease, MD 03/28/14 [phone]

## 2014-03-28 NOTE — ED Notes (Signed)
Generalized aches and pains, fever, nausea and vomiting for two days.  Some dysuria.

## 2014-03-28 NOTE — ED Notes (Signed)
Bed assignment received-HRP 775-report called to [facility] at [facility]

## 2014-03-28 NOTE — ED Notes (Signed)
bld cx x 2 drawn prior to IV abx started

## 2014-03-29 LAB — GC/CHLAMYDIA PROBE AMP
CT PROBE, AMP APTIMA: NEGATIVE
GC Probe RNA: NEGATIVE

## 2014-03-31 LAB — URINE CULTURE: Colony Count: >=100000

## 2014-04-01 ENCOUNTER — Telehealth (HOSPITAL_COMMUNITY): Payer: Self-pay

## 2014-04-01 ENCOUNTER — Telehealth (HOSPITAL_BASED_OUTPATIENT_CLINIC_OR_DEPARTMENT_OTHER): Payer: Self-pay | Admitting: Emergency Medicine

## 2014-04-03 LAB — CULTURE, BLOOD (ROUTINE X 2)
Culture: NO GROWTH
Culture: NO GROWTH

## 2015-06-15 ENCOUNTER — Encounter (HOSPITAL_BASED_OUTPATIENT_CLINIC_OR_DEPARTMENT_OTHER): Payer: Self-pay | Admitting: *Deleted

## 2015-06-15 ENCOUNTER — Emergency Department (HOSPITAL_BASED_OUTPATIENT_CLINIC_OR_DEPARTMENT_OTHER)
Admission: EM | Admit: 2015-06-15 | Discharge: 2015-06-15 | Disposition: A | Discharge status: Home / Self Care | Payer: Self-pay | Attending: Emergency Medicine | Admitting: Emergency Medicine

## 2015-06-15 DIAGNOSIS — Z862 Personal history of diseases of the blood and blood-forming organs and certain disorders involving the immune mechanism: Secondary | ICD-10-CM | POA: Insufficient documentation

## 2015-06-15 DIAGNOSIS — Z720 Tobacco use: Secondary | ICD-10-CM | POA: Insufficient documentation

## 2015-06-15 DIAGNOSIS — I10 Essential (primary) hypertension: Secondary | ICD-10-CM | POA: Insufficient documentation

## 2015-06-15 DIAGNOSIS — J45901 Unspecified asthma with (acute) exacerbation: Secondary | ICD-10-CM | POA: Insufficient documentation

## 2015-06-15 MED ORDER — PREDNISONE 20 MG PO TABS: 20.0000 mg | ORAL_TABLET | Freq: Two times a day (BID) | ORAL | Status: DC

## 2015-06-15 MED ORDER — PREDNISONE 50 MG PO TABS
60.0000 mg | ORAL_TABLET | Freq: Once | ORAL | Status: AC
  Administered 2015-06-15: 60 mg via ORAL
  Filled 2015-06-15 (×2): qty 1

## 2015-06-15 MED ORDER — ALBUTEROL (5 MG/ML) CONTINUOUS INHALATION SOLN
10.0000 mg/h | INHALATION_SOLUTION | Freq: Once | RESPIRATORY_TRACT | Status: AC
  Administered 2015-06-15: 10 mg/h via RESPIRATORY_TRACT
  Filled 2015-06-15: qty 20

## 2015-06-15 MED ORDER — ALBUTEROL SULFATE HFA 108 (90 BASE) MCG/ACT IN AERS: 1.0000 | INHALATION_SPRAY | Freq: Four times a day (QID) | RESPIRATORY_TRACT | Status: DC | PRN

## 2015-06-15 MED ORDER — CETIRIZINE HCL 10 MG PO TABS: 10.0000 mg | ORAL_TABLET | Freq: Every day | ORAL | Status: DC

## 2015-06-15 MED ORDER — IBUPROFEN 800 MG PO TABS
800.0000 mg | ORAL_TABLET | Freq: Once | ORAL | Status: AC
  Administered 2015-06-15: 800 mg via ORAL
  Filled 2015-06-15: qty 1

## 2015-06-15 NOTE — ED Notes (Signed)
10 mg Albuterol CAT completed

## 2015-06-15 NOTE — ED Notes (Signed)
Sob since last night. Hx of asthma. She has used her inhaler with no relief. States she feels she used it to much.

## 2015-06-15 NOTE — Discharge Instructions (Signed)

## 2015-06-15 NOTE — ED Provider Notes (Signed)
CSN: [id number]     Arrival date & time 06/15/15  1152 History   First MD Initiated Contact with Patient 06/15/15 1203     Chief Complaint  Patient presents with  . Shortness of Breath      HPI  Patient presents for evaluation of asthma. States she is short of breath since yesterday. Using her Qvar at home with minimal improvement or relief. Feels tight. Frequent cough. No fevers. No chest pain.  Past Medical History  Diagnosis Date  . Sickle cell trait   . Asthma   . Hypertension   . Anemia    Past Surgical History  Procedure Laterality Date  . Tubal ligation    . Hernia repair    . Tonsillectomy     No family history on file. Social History  Substance Use Topics  . Smoking status: Current Some Day Smoker -- 0.50 packs/day    Types: Cigarettes  . Smokeless tobacco: None  . Alcohol Use: Yes   OB History    No data available     Review of Systems  Constitutional: Negative for fever, chills, diaphoresis, appetite change and fatigue.  HENT: Negative for mouth sores, sore throat and trouble swallowing.   Eyes: Negative for visual disturbance.  Respiratory: Positive for chest tightness and wheezing. Negative for cough and shortness of breath.   Cardiovascular: Negative for chest pain.  Gastrointestinal: Negative for nausea, vomiting, abdominal pain, diarrhea and abdominal distention.  Endocrine: Negative for polydipsia, polyphagia and polyuria.  Genitourinary: Negative for dysuria, frequency and hematuria.  Musculoskeletal: Negative for gait problem.  Skin: Negative for color change, pallor and rash.  Neurological: Negative for dizziness, syncope, light-headedness and headaches.  Hematological: Does not bruise/bleed easily.  Psychiatric/Behavioral: Negative for behavioral problems and confusion.      Allergies  Review of patient's allergies indicates no known allergies.  Home Medications   Prior to Admission medications   Medication Sig Start Date End Date  Taking? Authorizing Provider  albuterol (PROVENTIL HFA;VENTOLIN HFA) 108 (90 BASE) MCG/ACT inhaler Inhale 1-2 puffs into the lungs every 6 (six) hours as needed for wheezing. 06/15/15   Rolland Porter, MD  cetirizine (ZYRTEC) 10 MG tablet Take 1 tablet (10 mg total) by mouth daily. 1 po q day prn allergies 06/15/15   Rolland Porter, MD  predniSONE (DELTASONE) 20 MG tablet Take 1 tablet (20 mg total) by mouth 2 (two) times daily with a meal. 06/15/15   Rolland Porter, MD   BP 140/78 mmHg  Pulse 82  Temp(Src) 97.7 F (36.5 C) (Oral)  Resp 20  Ht 5\' 6"  (1.676 m)  Wt 187 lb 5 oz (84.964 kg)  BMI 30.25 kg/m2  SpO2 100%  LMP 06/14/2015 Physical Exam  Constitutional: She is oriented to person, place, and time. She appears well-developed and well-nourished. No distress.  HENT:  Head: Normocephalic.  Eyes: Conjunctivae are normal. Pupils are equal, round, and reactive to light. No scleral icterus.  Neck: Normal range of motion. Neck supple. No thyromegaly present.  Cardiovascular: Normal rate and regular rhythm.  Exam reveals no gallop and no friction rub.   No murmur heard. Pulmonary/Chest: Effort normal. No respiratory distress. She has decreased breath sounds. She has wheezes. She has no rales.  Prolongation wheezing globally diminished breath sounds in all fields.  Abdominal: Soft. Bowel sounds are normal. She exhibits no distension. There is no tenderness. There is no rebound.  Musculoskeletal: Normal range of motion.  Neurological: She is alert and oriented to person, place,  and time.  Skin: Skin is warm and dry. No rash noted.  Psychiatric: She has a normal mood and affect. Her behavior is normal.    ED Course  Procedures (including critical care time) Labs Review Labs Reviewed - No data to display  Imaging Review No results found. I have personally reviewed and evaluated these images and lab results as part of my medical decision-making.   EKG Interpretation None      MDM   Final  diagnoses:  Asthma exacerbation    On continuous nebulizer, PO prednisone. Reevaluation.  CRITICAL CARE Performed by: Rolland Porter JOSEPH   Total critical care time: 60 minutes of continuous nebulized albuterol treatment.  Critical care time was exclusive of separately billable procedures and treating other patients.  Critical care was necessary to treat or prevent imminent or life-threatening deterioration.  Critical care was time spent personally by me on the following activities: development of treatment plan with patient and/or surrogate as well as nursing, discussions with consultants, evaluation of patient's response to treatment, examination of patient, obtaining history from patient or surrogate, ordering and performing treatments and interventions, ordering and review of laboratory studies, ordering and review of radiographic studies, pulse oximetry and re-evaluation of patient's condition.   13:40:  Patient received 1 hour nebulized albuterol. Has been off for approximately 30 minutes. Reexamination she has clear lungs. States she feels better. Well oxygenated at 98%. Heart rate 93. Able toward without recurrence of symptoms or hypoxemia. Plan is home, POC Channing Mutters, albuterol MDI, primary care follow-up. ER with acute changes.    Rolland Porter, MD 06/15/15 1341

## 2015-07-20 ENCOUNTER — Encounter (HOSPITAL_BASED_OUTPATIENT_CLINIC_OR_DEPARTMENT_OTHER): Payer: Self-pay | Admitting: Emergency Medicine

## 2015-07-20 ENCOUNTER — Emergency Department (HOSPITAL_BASED_OUTPATIENT_CLINIC_OR_DEPARTMENT_OTHER)
Admission: EM | Admit: 2015-07-20 | Discharge: 2015-07-20 | Disposition: A | Discharge status: Home / Self Care | Payer: Self-pay | Attending: Emergency Medicine | Admitting: Emergency Medicine

## 2015-07-20 ENCOUNTER — Emergency Department (HOSPITAL_BASED_OUTPATIENT_CLINIC_OR_DEPARTMENT_OTHER): Payer: Self-pay

## 2015-07-20 DIAGNOSIS — I10 Essential (primary) hypertension: Secondary | ICD-10-CM | POA: Insufficient documentation

## 2015-07-20 DIAGNOSIS — Z7951 Long term (current) use of inhaled steroids: Secondary | ICD-10-CM | POA: Insufficient documentation

## 2015-07-20 DIAGNOSIS — Z79899 Other long term (current) drug therapy: Secondary | ICD-10-CM | POA: Insufficient documentation

## 2015-07-20 DIAGNOSIS — Z862 Personal history of diseases of the blood and blood-forming organs and certain disorders involving the immune mechanism: Secondary | ICD-10-CM | POA: Insufficient documentation

## 2015-07-20 DIAGNOSIS — Z720 Tobacco use: Secondary | ICD-10-CM | POA: Insufficient documentation

## 2015-07-20 DIAGNOSIS — J209 Acute bronchitis, unspecified: Secondary | ICD-10-CM

## 2015-07-20 DIAGNOSIS — J4521 Mild intermittent asthma with (acute) exacerbation: Secondary | ICD-10-CM | POA: Insufficient documentation

## 2015-07-20 MED ORDER — IPRATROPIUM BROMIDE 0.02 % IN SOLN
0.5000 mg | Freq: Once | RESPIRATORY_TRACT | Status: AC
  Administered 2015-07-20: 0.5 mg via RESPIRATORY_TRACT
  Filled 2015-07-20: qty 2.5

## 2015-07-20 MED ORDER — GUAIFENESIN-CODEINE 100-10 MG/5ML PO SOLN: 5.0000 mL | Freq: Three times a day (TID) | ORAL | Status: DC | PRN

## 2015-07-20 MED ORDER — AZITHROMYCIN 250 MG PO TABS
250.0000 mg | ORAL_TABLET | Freq: Every day | ORAL | Status: DC
Start: 2015-07-20 — End: 2015-09-25

## 2015-07-20 MED ORDER — ALBUTEROL SULFATE (2.5 MG/3ML) 0.083% IN NEBU
5.0000 mg | INHALATION_SOLUTION | Freq: Once | RESPIRATORY_TRACT | Status: AC
  Administered 2015-07-20: 5 mg via RESPIRATORY_TRACT
  Filled 2015-07-20: qty 6

## 2015-07-20 MED ORDER — PREDNISONE 50 MG PO TABS
60.0000 mg | ORAL_TABLET | Freq: Once | ORAL | Status: AC
  Administered 2015-07-20: 60 mg via ORAL
  Filled 2015-07-20 (×2): qty 1

## 2015-07-20 MED ORDER — PREDNISONE 20 MG PO TABS: 40.0000 mg | ORAL_TABLET | Freq: Every day | ORAL | Status: DC

## 2015-07-20 MED ORDER — ALBUTEROL SULFATE HFA 108 (90 BASE) MCG/ACT IN AERS
2.0000 | INHALATION_SPRAY | Freq: Once | RESPIRATORY_TRACT | Status: AC
  Administered 2015-07-20: 2 via RESPIRATORY_TRACT
  Filled 2015-07-20: qty 6.7

## 2015-07-20 MED ORDER — BENZONATATE 100 MG PO CAPS
200.0000 mg | ORAL_CAPSULE | Freq: Once | ORAL | Status: AC
  Administered 2015-07-20: 200 mg via ORAL
  Filled 2015-07-20: qty 2

## 2015-07-20 NOTE — ED Notes (Signed)
Pa  at bedside. 

## 2015-07-20 NOTE — ED Notes (Signed)
Pt having runny nose, cough, sputum yellow.  Headache, vomiting after coughing.  Started one week ago.

## 2015-07-20 NOTE — ED Provider Notes (Signed)
CSN: [id number]     Arrival date & time 07/20/15  1031 History   First MD Initiated Contact with Patient 07/20/15 1111     Chief Complaint  Patient presents with  . URI     (Consider location/radiation/quality/duration/timing/severity/associated sxs/prior Treatment) HPI    Patient is a 39 year old female, current smoker, with PMHx of asthma, hypertension, anemia and sickle cell trait, she presents to the emergency room with approximately 1 week of increasing runny nose, sneezing, cough with productive yellow sputum, headache, chills and sweats and post tussive emesis.  She has had to use her inhalers multiple times per day, more in the morning and at night due to increasing shortness of breath. She has tried over-the-counter Mucinex decongestant without any improvement.  She denies any chest pain, abdominal pain, diarrhea, fever, sick contacts.  Past Medical History  Diagnosis Date  . Sickle cell trait (HCC)   . Asthma   . Hypertension   . Anemia    Past Surgical History  Procedure Laterality Date  . Tubal ligation    . Hernia repair    . Tonsillectomy     No family history on file. Social History  Substance Use Topics  . Smoking status: Current Some Day Smoker -- 0.50 packs/day    Types: Cigarettes  . Smokeless tobacco: None  . Alcohol Use: Yes   OB History    No data available     Review of Systems  Constitutional: Negative for activity change and appetite change.  HENT: Positive for congestion. Negative for sneezing, sore throat and trouble swallowing.   Eyes: Negative.  Negative for discharge.  Respiratory: Positive for cough, chest tightness and shortness of breath. Negative for choking and wheezing.   Cardiovascular: Negative.  Negative for chest pain, palpitations and leg swelling.  Gastrointestinal: Negative for nausea, abdominal pain, diarrhea, constipation, blood in stool, abdominal distention and rectal pain.  Genitourinary: Negative.   Musculoskeletal:  Negative.   Skin: Negative.  Negative for color change and rash.  Neurological: Negative.  Negative for syncope, weakness, light-headedness and headaches.  Psychiatric/Behavioral: Negative.       Allergies  Review of patient's allergies indicates no known allergies.  Home Medications   Prior to Admission medications   Medication Sig Start Date End Date Taking? Authorizing Provider  beclomethasone (QVAR) 40 MCG/ACT inhaler Inhale 2 puffs into the lungs 2 (two) times daily.   Yes Historical Provider, MD  albuterol (PROVENTIL HFA;VENTOLIN HFA) 108 (90 BASE) MCG/ACT inhaler Inhale 1-2 puffs into the lungs every 6 (six) hours as needed for wheezing. 06/15/15   Rolland Porter, MD  azithromycin (ZITHROMAX) 250 MG tablet Take 1 tablet (250 mg total) by mouth daily. Take first 2 tablets together, then 1 every day until finished. 07/20/15   Danelle Berry, PA-C  cetirizine (ZYRTEC) 10 MG tablet Take 1 tablet (10 mg total) by mouth daily. 1 po q day prn allergies 06/15/15   Rolland Porter, MD  guaiFENesin-codeine 100-10 MG/5ML syrup Take 5 mLs by mouth 3 (three) times daily as needed for cough. 07/20/15   Danelle Berry, PA-C  predniSONE (DELTASONE) 20 MG tablet Take 2 tablets (40 mg total) by mouth daily. 07/20/15   Taline Nass, PA-C   BP 130/76 mmHg  Pulse 74  Temp(Src) 98.3 F (36.8 C) (Oral)  Resp 20  Ht 5\' 6"  (1.676 m)  Wt 186 lb (84.369 kg)  BMI 30.04 kg/m2  SpO2 100%  LMP 07/18/2015 Physical Exam  Constitutional: She is oriented to person, place, and  time. She appears well-developed and well-nourished. No distress.  HENT:  Head: Normocephalic and atraumatic.  Clear nasal discharge with mildly erythematous nasal mucosa, posterior oropharynx mildly edematous, no exudate, no edema, uvula midline  Eyes: Conjunctivae and EOM are normal. Pupils are equal, round, and reactive to light. Right eye exhibits no discharge. Left eye exhibits no discharge. No scleral icterus.  Neck: Normal range of motion. No  JVD present. No tracheal deviation present. No thyromegaly present.  Cardiovascular: Normal rate, regular rhythm, normal heart sounds and intact distal pulses.  Exam reveals no gallop and no friction rub.   No murmur heard. Pulmonary/Chest: Effort normal. No respiratory distress. She has wheezes. She has no rales. She exhibits no tenderness.  Decreased BS bilaterally at the bases, faint exp wheeze bilaterally in upper lung fields, frequent cough, no accessory muscle use, to increased work of breathing, no cyanosis  Abdominal: Soft. Bowel sounds are normal. She exhibits no distension and no mass. There is no tenderness. There is no rebound and no guarding.  Musculoskeletal: Normal range of motion. She exhibits no edema or tenderness.  Lymphadenopathy:    She has no cervical adenopathy.  Neurological: She is alert and oriented to person, place, and time. She has normal reflexes. No cranial nerve deficit. She exhibits normal muscle tone. Coordination normal.  Skin: Skin is warm and dry. No rash noted. She is not diaphoretic. No erythema. No pallor.  Psychiatric: She has a normal mood and affect. Her behavior is normal. Judgment and thought content normal.  Nursing note and vitals reviewed.   ED Course  Procedures (including critical care time) Labs Review Labs Reviewed - No data to display  Imaging Review Dg Chest 2 View  07/20/2015  CLINICAL DATA:  Shortness of breath, cough, and chest discomfort since yesterday. EXAM: CHEST  2 VIEW COMPARISON:  10/26/2013 FINDINGS: The heart size and mediastinal contours are within normal limits. Both lungs are clear. The visualized skeletal structures are unremarkable. IMPRESSION: Negative.  No active cardiopulmonary disease. Electronically Signed   By: Myles Rosenthal M.D.   On: 07/20/2015 12:53   I have personally reviewed and evaluated these images and lab results as part of my medical decision-making.   EKG Interpretation None      MDM   Final  diagnoses:  Acute bronchitis, unspecified organism  Asthma exacerbation attacks, mild intermittent    Pt with cough and URI sx for > 1 week, has been using inhaler more frequently, some productive sputum and chest tightness  Pt wheeze cleared after breathing treatment, breath sounds improved at the bases.  She also noted significant improvement of her chest tightness and cough with treatment received in the ER.  She was given a oral dose of steroids as well. And was discharged home with steroid burst, cough syrup and azithromycin to treat for acute bronchitis with comorbidy asthma.  She was given strict return precautions, patient verbalized understanding. She is also discharged home with a work note.    While in the ER on room air the patient maintained oxygen saturation 100%, and was discharged home in stable condition.     Danelle Berry, PA-C 07/21/15 1657  Gwyneth Sprout, MD 07/22/15 [id number]

## 2015-07-20 NOTE — Discharge Instructions (Signed)
Acute Bronchitis Bronchitis is inflammation of the airways that extend from the windpipe into the lungs (bronchi). The inflammation often causes mucus to develop. This leads to a cough, which is the most common symptom of bronchitis.  In acute bronchitis, the condition usually develops suddenly and goes away over time, usually in a couple weeks. Smoking, allergies, and asthma can make bronchitis worse. Repeated episodes of bronchitis may cause further lung problems.  CAUSES Acute bronchitis is most often caused by the same virus that causes a cold. The virus can spread from person to person (contagious) through coughing, sneezing, and touching contaminated objects. SIGNS AND SYMPTOMS   Cough.   Fever.   Coughing up mucus.   Body aches.   Chest congestion.   Chills.   Shortness of breath.   Sore throat.  DIAGNOSIS  Acute bronchitis is usually diagnosed through a physical exam. Your health care provider will also ask you questions about your medical history. Tests, such as chest X-rays, are sometimes done to rule out other conditions.  TREATMENT  Acute bronchitis usually goes away in a couple weeks. Oftentimes, no medical treatment is necessary. Medicines are sometimes given for relief of fever or cough. Antibiotic medicines are usually not needed but may be prescribed in certain situations. In some cases, an inhaler may be recommended to help reduce shortness of breath and control the cough. A cool mist vaporizer may also be used to help thin bronchial secretions and make it easier to clear the chest.  HOME CARE INSTRUCTIONS  Get plenty of rest.   Drink enough fluids to keep your urine clear or pale yellow (unless you have a medical condition that requires fluid restriction). Increasing fluids may help thin your respiratory secretions (sputum) and reduce chest congestion, and it will prevent dehydration.   Take medicines only as directed by your health care provider.  If  you were prescribed an antibiotic medicine, finish it all even if you start to feel better.  Avoid smoking and secondhand smoke. Exposure to cigarette smoke or irritating chemicals will make bronchitis worse. If you are a smoker, consider using nicotine gum or skin patches to help control withdrawal symptoms. Quitting smoking will help your lungs heal faster.   Reduce the chances of another bout of acute bronchitis by washing your hands frequently, avoiding people with cold symptoms, and trying not to touch your hands to your mouth, nose, or eyes.   Keep all follow-up visits as directed by your health care provider.  SEEK MEDICAL CARE IF: Your symptoms do not improve after 1 week of treatment.  SEEK IMMEDIATE MEDICAL CARE IF:  You develop an increased fever or chills.   You have chest pain.   You have severe shortness of breath.  You have bloody sputum.   You develop dehydration.  You faint or repeatedly feel like you are going to pass out.  You develop repeated vomiting.  You develop a severe headache. MAKE SURE YOU:   Understand these instructions.  Will watch your condition.  Will get help right away if you are not doing well or get worse.   This information is not intended to replace advice given to you by your health care provider. Make sure you discuss any questions you have with your health care provider.   Document Released: 10/27/2004 Document Revised: 10/10/2014 Document Reviewed: 03/12/2013 Elsevier Interactive Patient Education 2016 [employer] Inc[employer]  Asthma, Adult Asthma is a condition of the lungs in which the airways tighten and narrow. Asthma can  make it hard to breathe. Asthma cannot be cured, but medicine and lifestyle changes can help control it. Asthma may be started (triggered) by:  Animal skin flakes (dander).  Dust.  Cockroaches.  Pollen.  Mold.  Smoke.  Cleaning products.  Hair sprays or aerosol sprays.  Paint fumes or strong  smells.  Cold air, weather changes, and winds.  Crying or laughing hard.  Stress.  Certain medicines or drugs.  Foods, such as dried fruit, potato chips, and sparkling grape juice.  Infections or conditions (colds, flu).  Exercise.  Certain medical conditions or diseases.  Exercise or tiring activities. HOME CARE   Take medicine as told by your doctor.  Use a peak flow meter as told by your doctor. A peak flow meter is a tool that measures how well the lungs are working.  Record and keep track of the peak flow meter's readings.  Understand and use the asthma action plan. An asthma action plan is a written plan for taking care of your asthma and treating your attacks.  To help prevent asthma attacks:  Do not smoke. Stay away from secondhand smoke.  Change your heating and air conditioning filter often.  Limit your use of fireplaces and wood stoves.  Get rid of pests (such as roaches and mice) and their droppings.  Throw away plants if you see mold on them.  Clean your floors. Dust regularly. Use cleaning products that do not smell.  Have someone vacuum when you are not home. Use a vacuum cleaner with a HEPA filter if possible.  Replace carpet with wood, tile, or vinyl flooring. Carpet can trap animal skin flakes and dust.  Use allergy-proof pillows, mattress covers, and box spring covers.  Wash bed sheets and blankets every week in hot water and dry them in a dryer.  Use blankets that are made of polyester or cotton.  Clean bathrooms and kitchens with bleach. If possible, have someone repaint the walls in these rooms with mold-resistant paint. Keep out of the rooms that are being cleaned and painted.  Wash hands often. GET HELP IF:  You have make a whistling sound when breaking (wheeze), have shortness of breath, or have a cough even if taking medicine to prevent attacks.  The colored mucus you cough up (sputum) is thicker than usual.  The colored mucus  you cough up changes from clear or [place] to yellow, green, gray, or bloody.  You have problems from the medicine you are taking such as:  A rash.  Itching.  Swelling.  Trouble breathing.  You need reliever medicines more than 2-3 times a week.  Your peak flow measurement is still at 50-79% of your personal best after following the action plan for 1 hour.  You have a fever. GET HELP RIGHT AWAY IF:   You seem to be worse and are not responding to medicine during an asthma attack.  You are short of breath even at rest.  You get short of breath when doing very little activity.  You have trouble eating, drinking, or talking.  You have chest pain.  You have a fast heartbeat.  Your lips or fingernails start to turn blue.  You are light-headed, dizzy, or faint.  Your peak flow is less than 50% of your personal best.   This information is not intended to replace advice given to you by your health care provider. Make sure you discuss any questions you have with your health care provider.   Document Released: 03/07/2008 Document  Revised: 06/10/2015 Document Reviewed: 04/18/2013 Elsevier Interactive Patient Education [employer]

## 2015-09-24 ENCOUNTER — Emergency Department (HOSPITAL_BASED_OUTPATIENT_CLINIC_OR_DEPARTMENT_OTHER)
Admission: EM | Admit: 2015-09-24 | Discharge: 2015-09-25 | Disposition: A | Discharge status: Home / Self Care | Payer: Self-pay | Attending: Emergency Medicine | Admitting: Emergency Medicine

## 2015-09-24 ENCOUNTER — Encounter (HOSPITAL_BASED_OUTPATIENT_CLINIC_OR_DEPARTMENT_OTHER): Payer: Self-pay

## 2015-09-24 DIAGNOSIS — Z79899 Other long term (current) drug therapy: Secondary | ICD-10-CM | POA: Insufficient documentation

## 2015-09-24 DIAGNOSIS — Z3202 Encounter for pregnancy test, result negative: Secondary | ICD-10-CM | POA: Insufficient documentation

## 2015-09-24 DIAGNOSIS — R112 Nausea with vomiting, unspecified: Secondary | ICD-10-CM | POA: Insufficient documentation

## 2015-09-24 DIAGNOSIS — I10 Essential (primary) hypertension: Secondary | ICD-10-CM | POA: Insufficient documentation

## 2015-09-24 DIAGNOSIS — Z7952 Long term (current) use of systemic steroids: Secondary | ICD-10-CM | POA: Insufficient documentation

## 2015-09-24 DIAGNOSIS — N390 Urinary tract infection, site not specified: Secondary | ICD-10-CM | POA: Insufficient documentation

## 2015-09-24 DIAGNOSIS — Z7951 Long term (current) use of inhaled steroids: Secondary | ICD-10-CM | POA: Insufficient documentation

## 2015-09-24 DIAGNOSIS — J45909 Unspecified asthma, uncomplicated: Secondary | ICD-10-CM | POA: Insufficient documentation

## 2015-09-24 HISTORY — DX: Gastro-esophageal reflux disease without esophagitis: K21.9

## 2015-09-24 LAB — URINALYSIS, ROUTINE W REFLEX MICROSCOPIC
Bilirubin Urine: NEGATIVE
GLUCOSE, UA: NEGATIVE mg/dL
Hgb urine dipstick: NEGATIVE
Ketones, ur: NEGATIVE mg/dL
NITRITE: NEGATIVE
PH: 7 (ref 5.0–8.0)
Protein, ur: NEGATIVE mg/dL
SPECIFIC GRAVITY, URINE: 1.016 (ref 1.005–1.030)

## 2015-09-24 LAB — URINE MICROSCOPIC-ADD ON

## 2015-09-24 LAB — PREGNANCY, URINE: PREG TEST UR: NEGATIVE

## 2015-09-24 NOTE — ED Notes (Signed)
Pt c/o 5 episodes of vomiting since this morning with generalized abdominal pain.  Denies diarrhea, denies fever, denies sick contacts.

## 2015-09-25 MED ORDER — FOSFOMYCIN TROMETHAMINE 3 G PO PACK
3.0000 g | PACK | Freq: Once | ORAL | Status: AC
  Administered 2015-09-25: 3 g via ORAL
  Filled 2015-09-25: qty 3

## 2015-09-25 MED ORDER — ONDANSETRON HCL 4 MG/2ML IJ SOLN
4.0000 mg | Freq: Once | INTRAMUSCULAR | Status: AC
  Administered 2015-09-25: 4 mg via INTRAVENOUS
  Filled 2015-09-25: qty 2

## 2015-09-25 MED ORDER — ONDANSETRON 8 MG PO TBDP: 8.0000 mg | ORAL_TABLET | Freq: Three times a day (TID) | ORAL | Status: DC | PRN

## 2015-09-25 MED ORDER — SODIUM CHLORIDE 0.9 % IV BOLUS (SEPSIS)
1000.0000 mL | Freq: Once | INTRAVENOUS | Status: AC
  Administered 2015-09-25: 1000 mL via INTRAVENOUS

## 2015-09-25 NOTE — ED Provider Notes (Signed)
CSN: [id number]     Arrival date & time 09/24/15  2330 History   First MD Initiated Contact with Patient 09/25/15 0005     Chief Complaint  Patient presents with  . Vomiting      (Consider location/radiation/quality/duration/timing/severity/associated sxs/prior Treatment) HPI  This is a 39 year old female with nausea and vomiting since yesterday morning. She estimates she is vomited 5 times. Vomiting is exacerbated by any attempt at uterine drink. She has had some generalized abdominal cramping which is moderate in severity. She denies diarrhea, fever or dysuria.  Past Medical History  Diagnosis Date  . Sickle cell trait (HCC)   . Asthma   . Hypertension   . Anemia   . GERD (gastroesophageal reflux disease)    Past Surgical History  Procedure Laterality Date  . Tubal ligation    . Hernia repair    . Tonsillectomy     No family history on file. Social History  Substance Use Topics  . Smoking status: Current Some Day Smoker -- 0.50 packs/day    Types: Cigarettes  . Smokeless tobacco: None  . Alcohol Use: Yes   OB History    No data available     Review of Systems  All other systems reviewed and are negative.   Allergies  Review of patient's allergies indicates no known allergies.  Home Medications   Prior to Admission medications   Medication Sig Start Date End Date Taking? Authorizing Provider  albuterol (PROVENTIL HFA;VENTOLIN HFA) 108 (90 BASE) MCG/ACT inhaler Inhale 1-2 puffs into the lungs every 6 (six) hours as needed for wheezing. 06/15/15  Yes Rolland Porter, MD  beclomethasone (QVAR) 40 MCG/ACT inhaler Inhale 2 puffs into the lungs 2 (two) times daily.   Yes Historical Provider, MD  cetirizine (ZYRTEC) 10 MG tablet Take 1 tablet (10 mg total) by mouth daily. 1 po q day prn allergies 06/15/15  Yes Rolland Porter, MD  esomeprazole (NEXIUM) 20 MG capsule Take 20 mg by mouth daily at 12 noon.   Yes Historical Provider, MD  azithromycin (ZITHROMAX) 250 MG tablet Take 1  tablet (250 mg total) by mouth daily. Take first 2 tablets together, then 1 every day until finished. 07/20/15   Danelle Berry, PA-C  guaiFENesin-codeine 100-10 MG/5ML syrup Take 5 mLs by mouth 3 (three) times daily as needed for cough. 07/20/15   Danelle Berry, PA-C  predniSONE (DELTASONE) 20 MG tablet Take 2 tablets (40 mg total) by mouth daily. 07/20/15   Danelle Berry, PA-C   BP 125/83 mmHg  Pulse 78  Temp(Src) 98.6 F (37 C) (Oral)  Resp 18  Ht 5\' 6"  (1.676 m)  Wt 180 lb (81.647 kg)  BMI 29.07 kg/m2  SpO2 99%  LMP 08/17/2015   Physical Exam  General: Well-developed, well-nourished female in no acute distress; appearance consistent with age of record HENT: normocephalic; atraumatic Eyes: pupils equal, round and reactive to light; extraocular muscles intact Neck: supple Heart: regular rate and rhythm Lungs: clear to auscultation bilaterally Abdomen: soft; nondistended; diffuse tenderness; no masses or hepatosplenomegaly; bowel sounds present Extremities: No deformity; full range of motion; pulses normal Neurologic: Awake, alert and oriented; motor function intact in all extremities and symmetric; no facial droop Skin: Warm and dry Psychiatric: Flat affect    ED Course  Procedures (including critical care time)   MDM  1:55 AM Drinking fluids without emesis after IV fluid bolus and Zofran.  Nursing notes and vitals signs, including pulse oximetry, reviewed.  Summary of this visit's results, reviewed  by myself:  Labs:  Results for orders placed or performed during the hospital encounter of 09/24/15 (from the past 24 hour(s))  Urinalysis, Routine w reflex microscopic (not at [facility])     Status: Abnormal   Collection Time: 09/24/15 11:37 PM  Result Value Ref Range   Color, Urine YELLOW YELLOW   APPearance CLOUDY (A) CLEAR   Specific Gravity, Urine 1.016 1.005 - 1.030   pH 7.0 5.0 - 8.0   Glucose, UA NEGATIVE NEGATIVE mg/dL   Hgb urine dipstick NEGATIVE NEGATIVE   Bilirubin  Urine NEGATIVE NEGATIVE   Ketones, ur NEGATIVE NEGATIVE mg/dL   Protein, ur NEGATIVE NEGATIVE mg/dL   Nitrite NEGATIVE NEGATIVE   Leukocytes, UA MODERATE (A) NEGATIVE  Pregnancy, urine     Status: None   Collection Time: 09/24/15 11:37 PM  Result Value Ref Range   Preg Test, Ur NEGATIVE NEGATIVE  Urine microscopic-add on     Status: Abnormal   Collection Time: 09/24/15 11:37 PM  Result Value Ref Range   Squamous Epithelial / LPF 6-30 (A) NONE SEEN   WBC, UA 6-30 0 - 5 WBC/hpf   RBC / HPF 0-5 0 - 5 RBC/hpf   Bacteria, UA MANY (A) NONE SEEN      Paula Libra, MD 09/25/15 [phone]

## 2015-09-27 LAB — URINE CULTURE

## 2015-09-28 ENCOUNTER — Telehealth: Payer: Self-pay | Admitting: *Deleted

## 2015-09-29 ENCOUNTER — Telehealth (HOSPITAL_COMMUNITY): Payer: Self-pay

## 2015-09-29 NOTE — Telephone Encounter (Signed)
09/27/2014 Post ED Visit - Positive Culture Follow-up: Successful Patient Follow-Up  Culture assessed and recommendations reviewed by: []  , Pharm.D. []  Enzo Bi, Pharm.D., BCPS []  , Pharm.D. []  Celedonio Miyamoto, Pharm.D., BCPS []  Blaine, Garvin Fila.D., BCPS, AAHIVP []  , Pharm.D., BCPS, AAHIVP []  Georgina Pillion, Pharm.D. []  , [place].D.  Positive urine culture, >/= 100,000 colonies -> E Coli  [x]  Patient discharged without antimicrobial prescription and treatment is now indicated []  Organism is resistant to prescribed ED discharge antimicrobial []  Patient with positive blood cultures  Changes discussed with ED provider: E. [address] [place]  If symptomatic with fever, abdominal pain, dysuria tx w/ Rx below New antibiotic prescription "Macrobid po 1 tab po BID x 5 days" Called to N/A  Contacted patient, date 09/28/2015 Left Message  09/28/2015 called back no symptoms @ present  12/27 chart appended.  Per 09/28/2015 notes Rx not needed  09/29/2015, 2:17 PM

## 2016-06-20 ENCOUNTER — Encounter (HOSPITAL_BASED_OUTPATIENT_CLINIC_OR_DEPARTMENT_OTHER): Payer: Self-pay | Admitting: Emergency Medicine

## 2016-06-20 ENCOUNTER — Emergency Department (HOSPITAL_BASED_OUTPATIENT_CLINIC_OR_DEPARTMENT_OTHER): Payer: Self-pay

## 2016-06-20 ENCOUNTER — Emergency Department (HOSPITAL_BASED_OUTPATIENT_CLINIC_OR_DEPARTMENT_OTHER)
Admission: EM | Admit: 2016-06-20 | Discharge: 2016-06-20 | Disposition: A | Discharge status: Home / Self Care | Payer: Self-pay | Attending: Emergency Medicine | Admitting: Emergency Medicine

## 2016-06-20 DIAGNOSIS — F17210 Nicotine dependence, cigarettes, uncomplicated: Secondary | ICD-10-CM | POA: Insufficient documentation

## 2016-06-20 DIAGNOSIS — Z79899 Other long term (current) drug therapy: Secondary | ICD-10-CM | POA: Insufficient documentation

## 2016-06-20 DIAGNOSIS — J45901 Unspecified asthma with (acute) exacerbation: Secondary | ICD-10-CM | POA: Insufficient documentation

## 2016-06-20 DIAGNOSIS — I10 Essential (primary) hypertension: Secondary | ICD-10-CM | POA: Insufficient documentation

## 2016-06-20 DIAGNOSIS — J069 Acute upper respiratory infection, unspecified: Secondary | ICD-10-CM | POA: Insufficient documentation

## 2016-06-20 DIAGNOSIS — B9789 Other viral agents as the cause of diseases classified elsewhere: Secondary | ICD-10-CM

## 2016-06-20 MED ORDER — ALBUTEROL SULFATE (2.5 MG/3ML) 0.083% IN NEBU
2.5000 mg | INHALATION_SOLUTION | Freq: Once | RESPIRATORY_TRACT | Status: AC
  Administered 2016-06-20: 2.5 mg via RESPIRATORY_TRACT
  Filled 2016-06-20: qty 3

## 2016-06-20 MED ORDER — ALBUTEROL SULFATE (2.5 MG/3ML) 0.083% IN NEBU
5.0000 mg | INHALATION_SOLUTION | RESPIRATORY_TRACT | Status: AC
  Administered 2016-06-20: 5 mg via RESPIRATORY_TRACT

## 2016-06-20 MED ORDER — IPRATROPIUM-ALBUTEROL 0.5-2.5 (3) MG/3ML IN SOLN
3.0000 mL | Freq: Once | RESPIRATORY_TRACT | Status: AC
  Administered 2016-06-20: 3 mL via RESPIRATORY_TRACT
  Filled 2016-06-20: qty 3

## 2016-06-20 MED ORDER — IPRATROPIUM BROMIDE 0.02 % IN SOLN
0.5000 mg | RESPIRATORY_TRACT | Status: AC
  Administered 2016-06-20: 0.5 mg via RESPIRATORY_TRACT

## 2016-06-20 MED ORDER — ALBUTEROL SULFATE (2.5 MG/3ML) 0.083% IN NEBU
INHALATION_SOLUTION | RESPIRATORY_TRACT | Status: AC
  Administered 2016-06-20: 5 mg via RESPIRATORY_TRACT
  Filled 2016-06-20: qty 6

## 2016-06-20 MED ORDER — PREDNISONE 20 MG PO TABS: ORAL_TABLET | ORAL | 0 refills | Status: DC

## 2016-06-20 MED ORDER — PREDNISONE 50 MG PO TABS
60.0000 mg | ORAL_TABLET | Freq: Once | ORAL | Status: AC
  Administered 2016-06-20: 60 mg via ORAL
  Filled 2016-06-20: qty 1

## 2016-06-20 MED ORDER — IPRATROPIUM BROMIDE 0.02 % IN SOLN
RESPIRATORY_TRACT | Status: AC
  Administered 2016-06-20: 0.5 mg via RESPIRATORY_TRACT
  Filled 2016-06-20: qty 2.5

## 2016-06-20 NOTE — ED Triage Notes (Signed)
Pt reports nasal drainage and productive cough for a few days, and increased shortness of breath this morning. Denies other symptoms.

## 2016-06-20 NOTE — ED Provider Notes (Signed)
[facility]   CSN: [id number] Arrival date & time: 06/20/16  1941     History   Chief Complaint Chief Complaint  Patient presents with  . Shortness of Breath    HPI Renee Cross is a 40 y.o. female.  HPI  40 year old female with a history of asthma presents with shortness of breath starting this morning. Patient states for last 3 days she's been having some clear nasal drainage, throat itchiness, and mild headache. Has not noticed fevers. This morning she woke up sweating and was having a cough with clear sputum as well as shortness of breath. Tried her albuterol pump without relief. Has had a little bit of right-sided chest pain when coughing. Feels like prior asthma exacerbations. She has been admitted to the hospital for asthma before but has never been intubated. Received albuterol and Atrovent prior to my arrival with partial relief. No leg swelling/edema  Past Medical History:  Diagnosis Date  . Anemia   . Asthma   . GERD (gastroesophageal reflux disease)   . Hypertension   . Sickle cell trait (HCC)     There are no active problems to display for this patient.   Past Surgical History:  Procedure Laterality Date  . HERNIA REPAIR    . TONSILLECTOMY    . TUBAL LIGATION      OB History    No data available       Home Medications    Prior to Admission medications   Medication Sig Start Date End Date Taking? Authorizing Provider  albuterol (PROVENTIL HFA;VENTOLIN HFA) 108 (90 BASE) MCG/ACT inhaler Inhale 1-2 puffs into the lungs every 6 (six) hours as needed for wheezing. 06/15/15   Rolland Porter, MD  beclomethasone (QVAR) 40 MCG/ACT inhaler Inhale 2 puffs into the lungs 2 (two) times daily.    Historical Provider, MD  cetirizine (ZYRTEC) 10 MG tablet Take 1 tablet (10 mg total) by mouth daily. 1 po q day prn allergies 06/15/15   Rolland Porter, MD  esomeprazole (NEXIUM) 20 MG capsule Take 20 mg by mouth daily at 12 noon.    Historical Provider,  MD  ondansetron (ZOFRAN ODT) 8 MG disintegrating tablet Take 1 tablet (8 mg total) by mouth every 8 (eight) hours as needed for nausea or vomiting. 09/25/15   John Molpus, MD  predniSONE (DELTASONE) 20 MG tablet 2 tabs po daily x 4 days 06/21/16   Pricilla Loveless, MD    Family History History reviewed. No pertinent family history.  Social History Social History  Substance Use Topics  . Smoking status: Current Every Day Smoker    Packs/day: 1.00    Types: Cigarettes  . Smokeless tobacco: Never Used  . Alcohol use Yes     Allergies   Review of patient's allergies indicates no known allergies.   Review of Systems Review of Systems  Constitutional: Positive for diaphoresis. Negative for fever.  HENT: Positive for congestion, rhinorrhea and sore throat.   Respiratory: Positive for cough and shortness of breath.   Neurological: Positive for headaches.  All other systems reviewed and are negative.    Physical Exam Updated Vital Signs BP 139/78 (BP Location: Right Arm)   Pulse 94   Temp 98 F (36.7 C) (Oral)   Resp 14   Ht 5\' 6"  (1.676 m)   Wt 190 lb (86.2 kg)   LMP 05/14/2016   SpO2 99%   BMI 30.67 kg/m   Physical Exam  Constitutional: She is oriented to person,  place, and time. She appears well-developed and well-nourished. No distress.  HENT:  Head: Normocephalic and atraumatic.  Right Ear: External ear normal.  Left Ear: External ear normal.  Nose: Nose normal.  Mouth/Throat: Oropharynx is clear and moist.  Eyes: Right eye exhibits no discharge. Left eye exhibits no discharge.  Cardiovascular: Normal rate, regular rhythm and normal heart sounds.   Pulmonary/Chest: She has wheezes (diffuse expiratory wheezes).  Mild tachypnea, no distress  Abdominal: Soft. There is no tenderness.  Musculoskeletal: She exhibits no edema.  Neurological: She is alert and oriented to person, place, and time.  Skin: Skin is warm and dry.  Nursing note and vitals reviewed.    ED  Treatments / Results  Labs (all labs ordered are listed, but only abnormal results are displayed) Labs Reviewed - No data to display  EKG  EKG Interpretation None       Radiology Dg Chest 2 View  Result Date: 06/20/2016 CLINICAL DATA:  Productive cough . EXAM: CHEST  2 VIEW COMPARISON:  07/20/2015 . FINDINGS: Mediastinum and hilar structures normal. Lungs are clear. Heart size normal. No pleural effusion or pneumothorax. No acute bony abnormality identified. IMPRESSION: No acute cardiopulmonary disease. Electronically Signed   By: Maisie Fus  Register   On: 06/20/2016 07:30    Procedures Procedures (including critical care time)  Medications Ordered in ED Medications  albuterol (PROVENTIL) (2.5 MG/3ML) 0.083% nebulizer solution 5 mg (5 mg Nebulization Given 06/20/16 0649)  ipratropium (ATROVENT) nebulizer solution 0.5 mg (0.5 mg Nebulization Given 06/20/16 0649)  ipratropium-albuterol (DUONEB) 0.5-2.5 (3) MG/3ML nebulizer solution 3 mL (3 mLs Nebulization Given 06/20/16 0718)  albuterol (PROVENTIL) (2.5 MG/3ML) 0.083% nebulizer solution 2.5 mg (2.5 mg Nebulization Given 06/20/16 0718)  predniSONE (DELTASONE) tablet 60 mg (60 mg Oral Given 06/20/16 0737)     Initial Impression / Assessment and Plan / ED Course  I have reviewed the triage vital signs and the nursing notes.  Pertinent labs & imaging results that were available during my care of the patient were reviewed by me and considered in my medical decision making (see chart for details).  Clinical Course  Comment By Time  Will give more albuterol as well as prednisone. Probably viral URI causing asthma flare. No distress. Pricilla Loveless, MD 09/18 [phone]  Patient feels much better. Chest x-ray is negative. She has only scant wheezes now and feels well enough to go home. Continue steroid burst at home. Discussed return precautions and for her to use her albuterol every 4 hours for the next 48 hours. Pricilla Loveless, MD 09/18 [phone]     Final Clinical Impressions(s) / ED Diagnoses   Final diagnoses:  Asthma exacerbation  Viral URI with cough    New Prescriptions New Prescriptions   PREDNISONE (DELTASONE) 20 MG TABLET    2 tabs po daily x 4 days     Pricilla Loveless, MD 06/20/16 [phone]

## 2016-06-20 NOTE — ED Notes (Signed)
Pt made aware to return if symptoms worsen or if any life threatening symptoms occur.   

## 2018-07-31 ENCOUNTER — Emergency Department (HOSPITAL_BASED_OUTPATIENT_CLINIC_OR_DEPARTMENT_OTHER): Payer: Self-pay

## 2018-07-31 ENCOUNTER — Emergency Department (HOSPITAL_BASED_OUTPATIENT_CLINIC_OR_DEPARTMENT_OTHER)
Admission: EM | Admit: 2018-07-31 | Discharge: 2018-07-31 | Disposition: A | Discharge status: Home / Self Care | Payer: Self-pay | Attending: Emergency Medicine | Admitting: Emergency Medicine

## 2018-07-31 ENCOUNTER — Other Ambulatory Visit: Payer: Self-pay

## 2018-07-31 ENCOUNTER — Encounter (HOSPITAL_BASED_OUTPATIENT_CLINIC_OR_DEPARTMENT_OTHER): Payer: Self-pay

## 2018-07-31 DIAGNOSIS — I10 Essential (primary) hypertension: Secondary | ICD-10-CM | POA: Insufficient documentation

## 2018-07-31 DIAGNOSIS — J4521 Mild intermittent asthma with (acute) exacerbation: Secondary | ICD-10-CM | POA: Insufficient documentation

## 2018-07-31 DIAGNOSIS — F17210 Nicotine dependence, cigarettes, uncomplicated: Secondary | ICD-10-CM | POA: Insufficient documentation

## 2018-07-31 DIAGNOSIS — Z79899 Other long term (current) drug therapy: Secondary | ICD-10-CM | POA: Insufficient documentation

## 2018-07-31 MED ORDER — HYDROCODONE-HOMATROPINE 5-1.5 MG/5ML PO SYRP: 5.0000 mL | ORAL_SOLUTION | Freq: Four times a day (QID) | ORAL | 0 refills | Status: DC | PRN

## 2018-07-31 MED ORDER — PREDNISONE 10 MG PO TABS: 40.0000 mg | ORAL_TABLET | Freq: Every day | ORAL | 0 refills | Status: DC

## 2018-07-31 MED ORDER — DEXTROMETHORPHAN POLISTIREX ER 30 MG/5ML PO SUER
30.0000 mg | Freq: Once | ORAL | Status: DC
  Filled 2018-07-31: qty 5

## 2018-07-31 MED ORDER — AZITHROMYCIN 250 MG PO TABS: 250.0000 mg | ORAL_TABLET | Freq: Every day | ORAL | 0 refills | Status: AC

## 2018-07-31 MED ORDER — ALBUTEROL SULFATE (5 MG/ML) 0.5% IN NEBU: 2.5000 mg | INHALATION_SOLUTION | Freq: Four times a day (QID) | RESPIRATORY_TRACT | 12 refills | Status: DC | PRN

## 2018-07-31 MED ORDER — AZITHROMYCIN 250 MG PO TABS
500.0000 mg | ORAL_TABLET | Freq: Once | ORAL | Status: AC
  Administered 2018-07-31: 500 mg via ORAL
  Filled 2018-07-31: qty 2

## 2018-07-31 MED ORDER — AZITHROMYCIN 250 MG PO TABS: 250.0000 mg | ORAL_TABLET | Freq: Every day | ORAL | 0 refills | Status: DC

## 2018-07-31 MED ORDER — PREDNISONE 50 MG PO TABS
60.0000 mg | ORAL_TABLET | Freq: Once | ORAL | Status: AC
  Administered 2018-07-31: 60 mg via ORAL
  Filled 2018-07-31: qty 1

## 2018-07-31 NOTE — ED Triage Notes (Signed)
Pt presents with chest pain, cough, congestion, HA, earache and unknown fevers.

## 2018-07-31 NOTE — ED Provider Notes (Signed)
[facility] EMERGENCY DEPARTMENT Provider Note   CSN: [id number] Arrival date & time: 07/31/18  1936     History   Chief Complaint Chief Complaint  Patient presents with  . URI    HPI Renee Cross is a 42 y.o. female with history of asthma is here for evaluation of cough.  Onset 1 week ago, initially cough improved slightly but it has gotten worse since Sunday.  Cough is persistent, forceful, dry and disruptive during the day.  She is having central chest wall pain with coughing and movement.  Associate symptoms include bilateral ears feeling clogged up, global headache, runny nose, congestion, scratchy throat, sweats, chills at night, vomiting x1, myalgias.  She feels short of breath when she lays flat to sleep from nasal congestion.  She denies any sick contacts, exertional chest pain or shortness of breath, abdominal pain, changes to bowel movements.  She has tried Mucinex, NyQuil, Robitussin and albuterol inhaler with slight, temporary relief.   HPI  Past Medical History:  Diagnosis Date  . Anemia   . Asthma   . GERD (gastroesophageal reflux disease)   . Hypertension   . Sickle cell trait (HCC)     There are no active problems to display for this patient.   Past Surgical History:  Procedure Laterality Date  . HERNIA REPAIR    . TONSILLECTOMY    . TUBAL LIGATION       OB History   None      Home Medications    Prior to Admission medications   Medication Sig Start Date End Date Taking? Authorizing Provider  albuterol (PROVENTIL HFA;VENTOLIN HFA) 108 (90 BASE) MCG/ACT inhaler Inhale 1-2 puffs into the lungs every 6 (six) hours as needed for wheezing. 06/15/15   Rolland Porter, MD  albuterol (PROVENTIL) (5 MG/ML) 0.5% nebulizer solution Take 0.5 mLs (2.5 mg total) by nebulization every 6 (six) hours as needed for wheezing or shortness of breath. 07/31/18   Liberty Handy, PA-C  azithromycin (ZITHROMAX) 250 MG tablet Take 1 tablet (250 mg total) by mouth  daily for 4 days. 07/31/18 08/04/18  Liberty Handy, PA-C  beclomethasone (QVAR) 40 MCG/ACT inhaler Inhale 2 puffs into the lungs 2 (two) times daily.    [provider]  cetirizine (ZYRTEC) 10 MG tablet Take 1 tablet (10 mg total) by mouth daily. 1 po q day prn allergies 06/15/15   Rolland Porter, MD  esomeprazole (NEXIUM) 20 MG capsule Take 20 mg by mouth daily at 12 noon.    [provider]  HYDROcodone-homatropine (HYCODAN) 5-1.5 MG/5ML syrup Take 5 mLs by mouth every 6 (six) hours as needed for cough. FOR DISRUPTIVE NIGHT TIME COUGH AND BODY ACHES 07/31/18   Liberty Handy, PA-C  ondansetron (ZOFRAN ODT) 8 MG disintegrating tablet Take 1 tablet (8 mg total) by mouth every 8 (eight) hours as needed for nausea or vomiting. 09/25/15   Molpus, John, MD  predniSONE (DELTASONE) 10 MG tablet Take 4 tablets (40 mg total) by mouth daily. 07/31/18   Liberty Handy, PA-C    Family History No family history on file.  Social History Social History   Tobacco Use  . Smoking status: Current Every Day Smoker    Packs/day: 1.00    Types: Cigarettes  . Smokeless tobacco: Never Used  Substance Use Topics  . Alcohol use: Yes  . Drug use: No     Allergies   Patient has no known allergies.   Review of Systems Review  of Systems  Constitutional: Positive for chills and fever.  HENT: Positive for congestion, ear pain (fullness), postnasal drip, rhinorrhea, sneezing and sore throat.   Respiratory: Positive for cough.   Cardiovascular: Positive for chest pain (with cough).  Gastrointestinal: Positive for vomiting.  Musculoskeletal: Positive for myalgias.  Neurological: Positive for headaches.  All other systems reviewed and are negative.    Physical Exam Updated Vital Signs BP (!) 144/87 (BP Location: Right Arm)   Pulse (!) 101   Temp 98.6 F (37 C) (Oral)   Resp 18   Ht 5\' 6"  (1.676 m)   Wt 95.3 kg   LMP 07/17/2018   SpO2 99%   BMI 33.89 kg/m   Physical  Exam  Constitutional: She is oriented to person, place, and time. She appears well-developed and well-nourished. No distress.  NAD.  Frequent forceful coughing, dry during exam.  HENT:  Head: Normocephalic and atraumatic.  Right Ear: External ear normal.  Left Ear: External ear normal.  Nose: Nose normal.  Moderate mucosal edema, erythema bilaterally.  Clear rhinorrhea noted. No sinus tenderness. Oropharynx and soft palate erythematous without petechiae.  Tonsils slightly edematous and erythematous without asymmetry, exudates.  Uvula is midline.  No pooling of oral secretions.  Normal phonation.  No sublingual fullness. TMs normal.  Eyes: Conjunctivae and EOM are normal. No scleral icterus.  Neck: Normal range of motion. Neck supple.  Mild submandibular enlarged, symmetric lymph nodes, mildly tender.  No asymmetric anterior neck edema.  Cardiovascular: Normal rate, regular rhythm and normal heart sounds.  Pulmonary/Chest: Effort normal and breath sounds normal. She has no wheezes.  Abdominal: Soft. There is no tenderness.  Musculoskeletal: Normal range of motion. She exhibits no deformity.  Neurological: She is alert and oriented to person, place, and time.  Skin: Skin is warm and dry. Capillary refill takes less than 2 seconds.  Psychiatric: She has a normal mood and affect. Her behavior is normal. Judgment and thought content normal.  Nursing note and vitals reviewed.    ED Treatments / Results  Labs (all labs ordered are listed, but only abnormal results are displayed) Labs Reviewed - No data to display  EKG None  Radiology Dg Chest 2 View  Result Date: 07/31/2018 CLINICAL DATA:  Cough with runny nose and headache.  Dyspnea. EXAM: CHEST - 2 VIEW COMPARISON:  06/20/2016 FINDINGS: The heart size and mediastinal contours are within normal limits. Both lungs are clear. The visualized skeletal structures are unremarkable. IMPRESSION: No active cardiopulmonary disease.  Electronically Signed   By: 06/22/2016 M.D.   On: 07/31/2018 21:32    Procedures Procedures (including critical care time)  Medications Ordered in ED Medications  dextromethorphan (DELSYM) 30 MG/5ML liquid 30 mg (30 mg Oral Not Given 07/31/18 2251)  azithromycin (ZITHROMAX) tablet 500 mg (500 mg Oral Given 07/31/18 2249)  predniSONE (DELTASONE) tablet 60 mg (60 mg Oral Given 07/31/18 2249)     Initial Impression / Assessment and Plan / ED Course  I have reviewed the triage vital signs and the nursing notes.  Pertinent labs & imaging results that were available during my care of the patient were reviewed by me and considered in my medical decision making (see chart for details).     Pt presents with with URI sxs including cough. H/o asthma. On exam, pt is non toxic appearing with normal breathing effort. No fever, no tachypnea, normal oxygen saturations. HR 101.  CXR normal.  Likely viral URI leading to bronchospasm/mild asthma exacerbation. Will tx  symptoms symptomatically and with albuterol and prednisone. Given chronicity of symptoms of 1 week and relapse of cough, it is reasonable to start z-pack today.  Infiltrate may be radiologically delayed. ED return precautions given. Patient is aware that a viral URI infection may precede the onset of bacterial bronchitis or pneumonia. Patient is aware of s/s that would warrant return to ED for further reevaluation. Pt ambulated with pulse ox within normal limits prior to discharge.    Final Clinical Impressions(s) / ED Diagnoses   Final diagnoses:  Mild intermittent asthma with exacerbation    ED Discharge Orders         Ordered    azithromycin (ZITHROMAX) 250 MG tablet  Daily,   Status:  Discontinued     07/31/18 2315    predniSONE (DELTASONE) 10 MG tablet  Daily,   Status:  Discontinued     07/31/18 2315    albuterol (PROVENTIL) (5 MG/ML) 0.5% nebulizer solution  Every 6 hours PRN,   Status:  Discontinued     07/31/18 2315    DME  Nebulizer machine     07/31/18 2315    HYDROcodone-homatropine (HYCODAN) 5-1.5 MG/5ML syrup  Every 6 hours PRN     07/31/18 2317    albuterol (PROVENTIL) (5 MG/ML) 0.5% nebulizer solution  Every 6 hours PRN     07/31/18 2325    azithromycin (ZITHROMAX) 250 MG tablet  Daily     07/31/18 2325    predniSONE (DELTASONE) 10 MG tablet  Daily     07/31/18 2325           Liberty Handy, PA-C 08/01/18 0110    Little, Ambrose Finland, MD 08/06/18 1601

## 2018-07-31 NOTE — Discharge Instructions (Signed)
You were seen in the ER for cough.  I think that you have a viral upper respiratory infection that has caused a mild asthma flare.  Given that your symptoms have been persistent for a week, although your chest x-ray was normal today, I will treat you with antibiotic for possible small pneumonia that is delayed to show up on x-ray.  Use albuterol nebulizing treatment every 6 hours for the next 48 hours.  Hycodan syrup at nighttime for cough suppression and chest wall pain.  Can take Delsym during the daytime to suppress cough.  Stay well-hydrated.    Your symptoms are most likely from a virus that has caused an upper respiratory infection. Your chest x-ray is negative. Your rapid strep test was negative. A viral illness typically peaks on day 2-3 and resolves after one week.    The main treatment approach for a viral upper respiratory infection is to treat the symptoms, support your immune system and prevent spread of illness.   Stay well-hydrated. Rest. You can use over the counter medications to help with symptoms: 600 mg ibuprofen (motrin, aleve, advil) or acetaminophen (tylenol) every 6 hours, around the clock to help with associated fevers, sore throat, headaches, generalized body aches and malaise.  Oxymetazoline (afrin) intranasal spray once daily for no more than 3 days to help with congestion, after 3 days you can switch to another over-the-counter nasal steroid spray such as fluticasone (flonase) Allergy medication (loratadine, cetirizine, etc) and phenylephrine (sudafed) help with nasal congestion, runny nose and postnasal drip.   Dextromethorphan (Delsym) to suppress cough without mucus Guaifenesin (mucinex) to help with built up mucus in chest and productive cough Wash your hands often to prevent spread.   Return to ER if symptoms worsen, persist and you develop persistent fevers, chest pain, shortness of breath, productive cough

## 2019-01-28 IMAGING — DX DG CHEST 2V
2 series · 2 of 2 positions shown · non-contrast
Comparison: 06/20/2016

CLINICAL DATA: Cough with runny nose and headache.  Dyspnea.

EXAM:
CHEST - 2 VIEW

[chest pa]
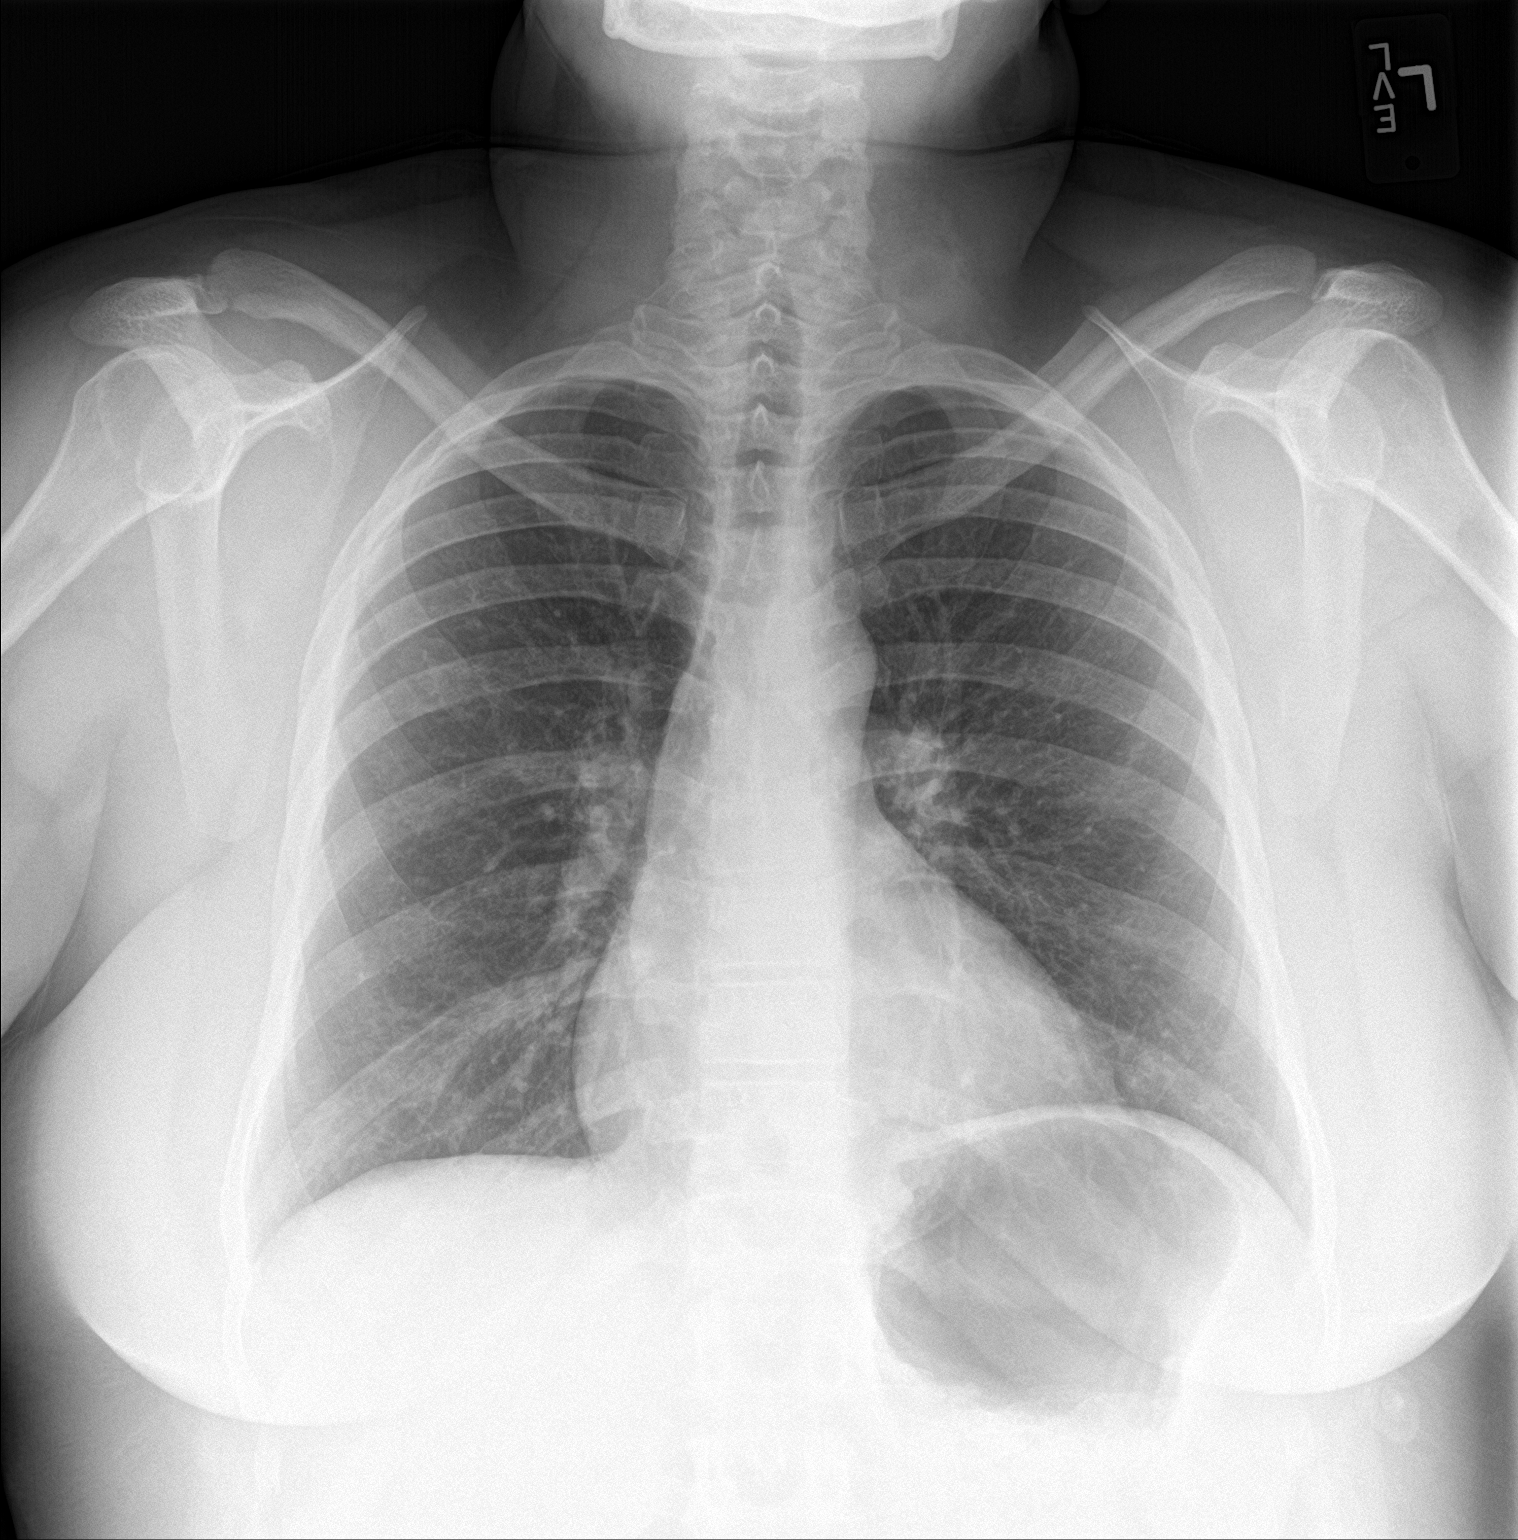

[chest lat]
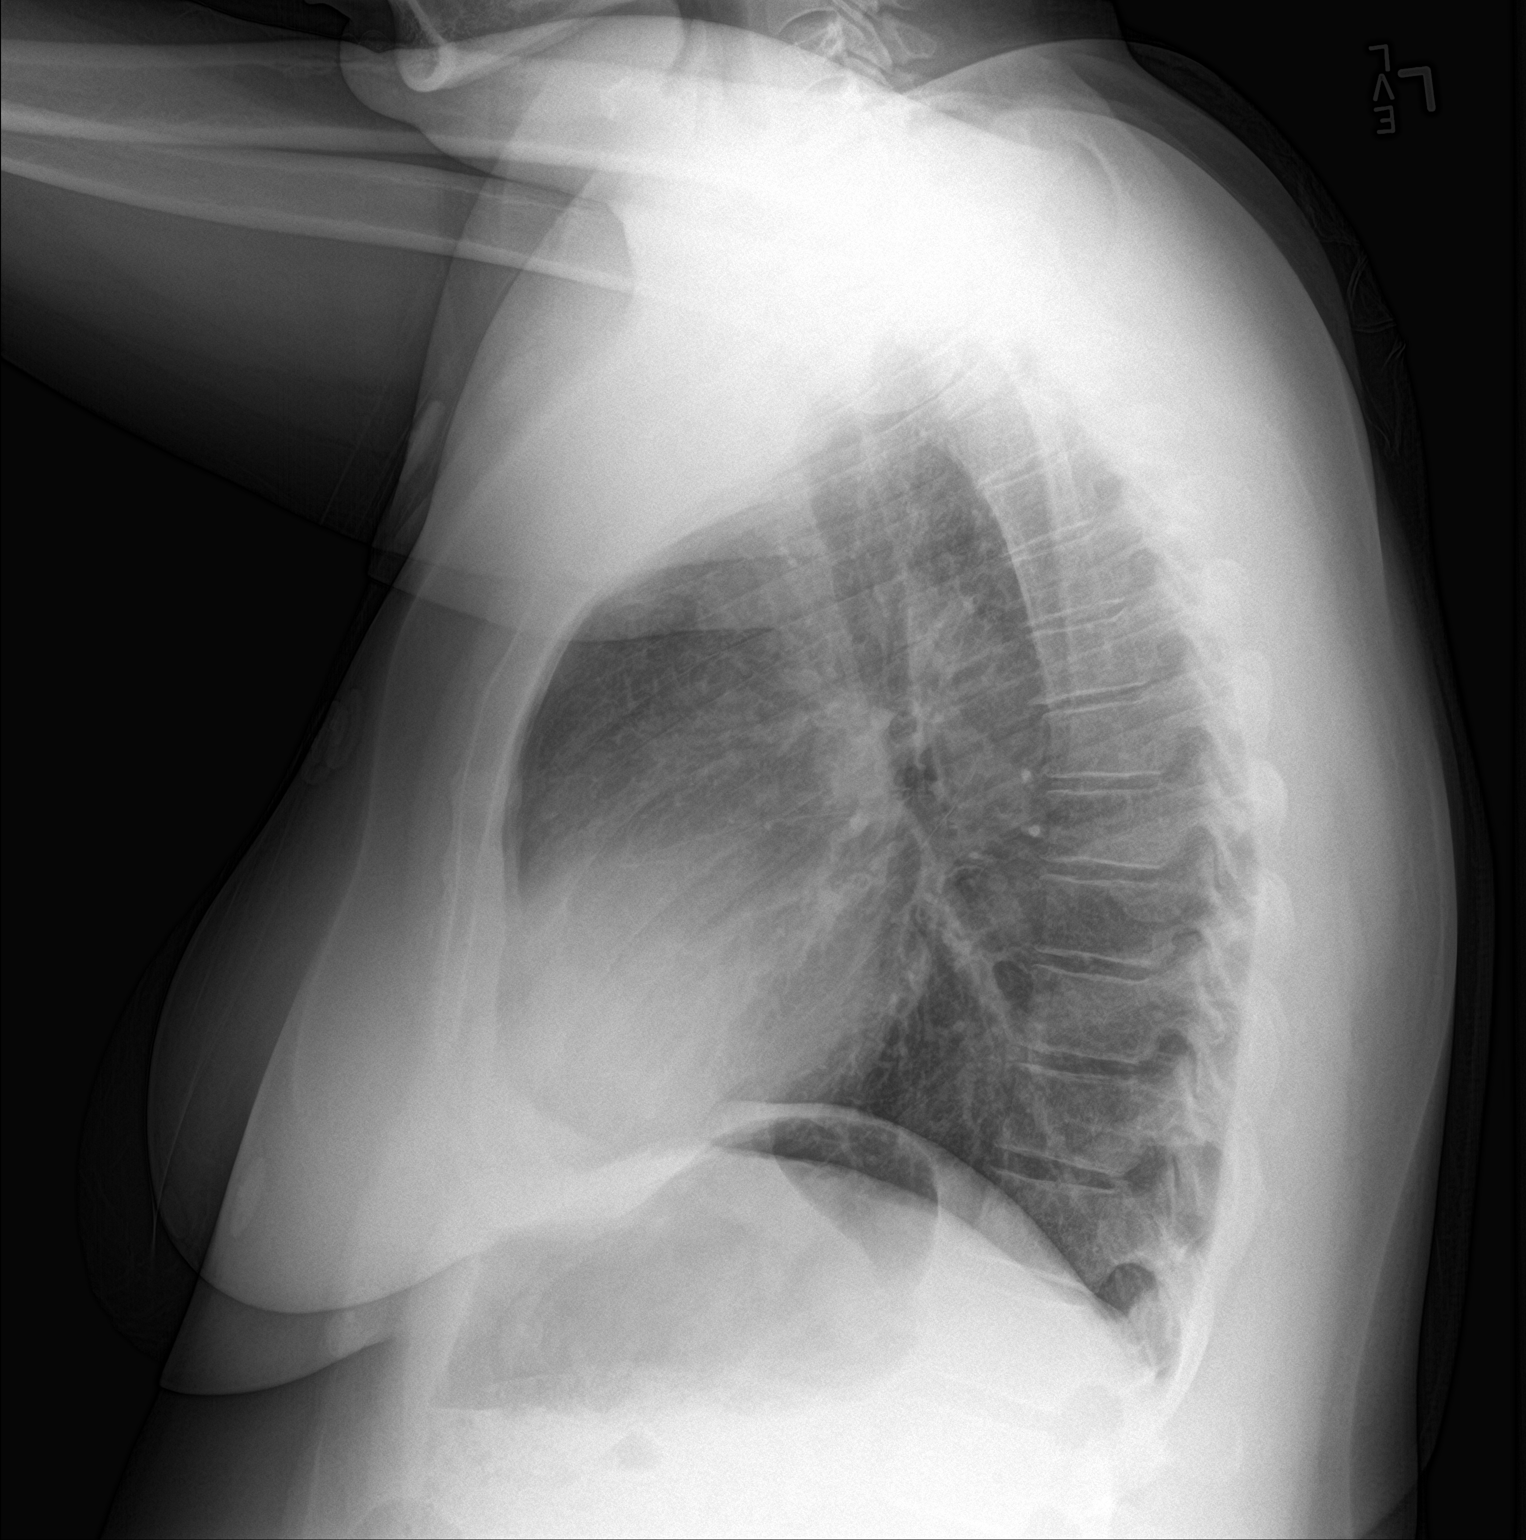

[2 of 2 positions shown; findings below may reference images not displayed]

FINDINGS: The heart size and mediastinal contours are within normal limits.
Both lungs are clear. The visualized skeletal structures are
unremarkable.
IMPRESSION: No active cardiopulmonary disease.

## 2020-08-06 ENCOUNTER — Encounter (HOSPITAL_BASED_OUTPATIENT_CLINIC_OR_DEPARTMENT_OTHER): Payer: Self-pay | Admitting: *Deleted

## 2020-08-06 ENCOUNTER — Emergency Department (HOSPITAL_BASED_OUTPATIENT_CLINIC_OR_DEPARTMENT_OTHER)
Admission: EM | Admit: 2020-08-06 | Discharge: 2020-08-06 | Disposition: A | Discharge status: Home / Self Care | Payer: Self-pay | Attending: Emergency Medicine | Admitting: Emergency Medicine

## 2020-08-06 ENCOUNTER — Other Ambulatory Visit: Payer: Self-pay

## 2020-08-06 DIAGNOSIS — J329 Chronic sinusitis, unspecified: Secondary | ICD-10-CM

## 2020-08-06 DIAGNOSIS — I10 Essential (primary) hypertension: Secondary | ICD-10-CM | POA: Insufficient documentation

## 2020-08-06 DIAGNOSIS — J029 Acute pharyngitis, unspecified: Secondary | ICD-10-CM | POA: Insufficient documentation

## 2020-08-06 DIAGNOSIS — R0981 Nasal congestion: Secondary | ICD-10-CM | POA: Insufficient documentation

## 2020-08-06 DIAGNOSIS — J45909 Unspecified asthma, uncomplicated: Secondary | ICD-10-CM | POA: Insufficient documentation

## 2020-08-06 DIAGNOSIS — Z20822 Contact with and (suspected) exposure to covid-19: Secondary | ICD-10-CM | POA: Insufficient documentation

## 2020-08-06 DIAGNOSIS — F17210 Nicotine dependence, cigarettes, uncomplicated: Secondary | ICD-10-CM | POA: Insufficient documentation

## 2020-08-06 LAB — RESPIRATORY PANEL BY RT PCR (FLU A&B, COVID)
Influenza A by PCR: NEGATIVE
Influenza B by PCR: NEGATIVE
SARS Coronavirus 2 by RT PCR: NEGATIVE

## 2020-08-06 MED ORDER — AMOXICILLIN-POT CLAVULANATE 875-125 MG PO TABS: 1.0000 | ORAL_TABLET | Freq: Two times a day (BID) | ORAL | 0 refills | Status: DC

## 2020-08-06 MED ORDER — AMOXICILLIN-POT CLAVULANATE 875-125 MG PO TABS
1.0000 | ORAL_TABLET | Freq: Once | ORAL | Status: AC
  Administered 2020-08-06: 1 via ORAL
  Filled 2020-08-06: qty 1

## 2020-08-06 NOTE — Discharge Instructions (Signed)
Take Augmentin twice daily for a week for sinusitis.  You are tested for Covid and if it is positive you will need to stay home for 10 days  See your doctor for follow-up  Return to ER if you have worse sinus congestion, fever, sinus pain

## 2020-08-06 NOTE — ED Triage Notes (Signed)
C/o covid symptoms x 3 days , fever chills congestion , sore throat.

## 2020-08-06 NOTE — ED Provider Notes (Signed)
[facility] EMERGENCY DEPARTMENT Provider Note   CSN: [id number] Arrival date & time: 08/06/20  1853     History Chief Complaint  Patient presents with  . URI    Yaquelin Langelier is a 44 y.o. female history of hypertension, sickle cell trait, Previous sinus surgery here presenting with sinus congestion and discharge and chills and sore throat.  Patient has been having above symptoms for the last 3 days.  Has some congestion with yellowish discharge.  Subjective sore throat as well.  Patient did not receive the Covid vaccine.  She has previous history of sinus surgery with sinusitis and she thought it is sinusitis.  The history is provided by the patient.       Past Medical History:  Diagnosis Date  . Anemia   . Asthma   . GERD (gastroesophageal reflux disease)   . Hypertension   . Sickle cell trait (HCC)     There are no problems to display for this patient.   Past Surgical History:  Procedure Laterality Date  . HERNIA REPAIR    . TONSILLECTOMY    . TUBAL LIGATION       OB History   No obstetric history on file.     No family history on file.  Social History   Tobacco Use  . Smoking status: Current Every Day Smoker    Packs/day: 1.00    Types: Cigarettes  . Smokeless tobacco: Never Used  Substance Use Topics  . Alcohol use: Yes  . Drug use: No    Home Medications Prior to Admission medications   Medication Sig Start Date End Date Taking? Authorizing Provider  albuterol (PROVENTIL HFA;VENTOLIN HFA) 108 (90 BASE) MCG/ACT inhaler Inhale 1-2 puffs into the lungs every 6 (six) hours as needed for wheezing. 06/15/15   Rolland Porter, MD  albuterol (PROVENTIL) (5 MG/ML) 0.5% nebulizer solution Take 0.5 mLs (2.5 mg total) by nebulization every 6 (six) hours as needed for wheezing or shortness of breath. 07/31/18   Liberty Handy, PA-C  beclomethasone (QVAR) 40 MCG/ACT inhaler Inhale 2 puffs into the lungs 2 (two) times daily.    [provider]    cetirizine (ZYRTEC) 10 MG tablet Take 1 tablet (10 mg total) by mouth daily. 1 po q day prn allergies 06/15/15   Rolland Porter, MD  esomeprazole (NEXIUM) 20 MG capsule Take 20 mg by mouth daily at 12 noon.    [provider]  HYDROcodone-homatropine (HYCODAN) 5-1.5 MG/5ML syrup Take 5 mLs by mouth every 6 (six) hours as needed for cough. FOR DISRUPTIVE NIGHT TIME COUGH AND BODY ACHES 07/31/18   Liberty Handy, PA-C  ondansetron (ZOFRAN ODT) 8 MG disintegrating tablet Take 1 tablet (8 mg total) by mouth every 8 (eight) hours as needed for nausea or vomiting. 09/25/15   Molpus, John, MD  predniSONE (DELTASONE) 10 MG tablet Take 4 tablets (40 mg total) by mouth daily. 07/31/18   Liberty Handy, PA-C    Allergies    Patient has no known allergies.  Review of Systems   Review of Systems  Constitutional: Positive for chills.  HENT: Positive for postnasal drip, sinus pressure, sinus pain and sore throat.   All other systems reviewed and are negative.   Physical Exam Updated Vital Signs BP (!) 153/99   Pulse (!) 109   Temp 98.3 F (36.8 C) (Oral)   Resp 18   Ht 5\' 6"  (1.676 m)   Wt 93.9 kg   LMP 07/23/2020  SpO2 100%   BMI 33.41 kg/m   Physical Exam Vitals and nursing note reviewed.  Constitutional:      Appearance: Normal appearance.  HENT:     Head: Normocephalic.     Right Ear: Tympanic membrane normal.     Left Ear: Tympanic membrane normal.     Nose: Nose normal.     Mouth/Throat:     Comments: Sinus tenderness bilaterally.  Posterior pharynx is not red or swollen no tonsillar exudate Eyes:     Extraocular Movements: Extraocular movements intact.     Pupils: Pupils are equal, round, and reactive to light.  Neck:     Comments: No meningeal sign Cardiovascular:     Rate and Rhythm: Normal rate and regular rhythm.     Pulses: Normal pulses.     Heart sounds: Normal heart sounds.  Pulmonary:     Effort: Pulmonary effort is normal.     Breath sounds:  Normal breath sounds.  Abdominal:     General: Abdomen is flat.     Palpations: Abdomen is soft.  Musculoskeletal:        General: Normal range of motion.     Cervical back: Normal range of motion and neck supple.  Skin:    General: Skin is warm.     Capillary Refill: Capillary refill takes less than 2 seconds.  Neurological:     General: No focal deficit present.     Mental Status: She is alert and oriented to person, place, and time.  Psychiatric:        Mood and Affect: Mood normal.        Behavior: Behavior normal.     ED Results / Procedures / Treatments   Labs (all labs ordered are listed, but only abnormal results are displayed) Labs Reviewed  RESPIRATORY PANEL BY RT PCR (FLU A&B, COVID)    EKG None  Radiology No results found.  Procedures Procedures (including critical care time)  Medications Ordered in ED Medications  amoxicillin-clavulanate (AUGMENTIN) 875-125 MG per tablet 1 tablet (has no administration in time range)    ED Course  I have reviewed the triage vital signs and the nursing notes.  Pertinent labs & imaging results that were available during my care of the patient were reviewed by me and considered in my medical decision making (see chart for details).    MDM Rules/Calculators/A&P                         Cleola Perryman is a 44 y.o. female who presented with sore throat and fever and congestion.  Patient is not vaccinated for Covid.  We will test for Covid.  However she is not hypoxic.  She may have a sinus infection as well.  Will discharge home with Augmentin and I told her if she is Covid positive she will need to stay home for 10 days.   Final Clinical Impression(s) / ED Diagnoses Final diagnoses:  None    Rx / DC Orders ED Discharge Orders    None       Charlynne Pander, MD 08/06/20 1952

## 2020-09-17 DIAGNOSIS — R1909 Other intra-abdominal and pelvic swelling, mass and lump: Secondary | ICD-10-CM

## 2020-09-17 HISTORY — DX: Other intra-abdominal and pelvic swelling, mass and lump: R19.09

## 2021-01-27 DIAGNOSIS — J339 Nasal polyp, unspecified: Secondary | ICD-10-CM | POA: Insufficient documentation

## 2021-01-27 DIAGNOSIS — J0191 Acute recurrent sinusitis, unspecified: Secondary | ICD-10-CM

## 2021-01-27 HISTORY — DX: Nasal polyp, unspecified: J33.9

## 2021-01-27 HISTORY — DX: Acute recurrent sinusitis, unspecified: J01.91

## 2021-02-05 ENCOUNTER — Ambulatory Visit: Payer: Self-pay | Admitting: Allergy and Immunology

## 2021-02-09 ENCOUNTER — Ambulatory Visit: Payer: Self-pay | Admitting: Allergy and Immunology

## 2021-04-10 ENCOUNTER — Emergency Department (HOSPITAL_BASED_OUTPATIENT_CLINIC_OR_DEPARTMENT_OTHER)
Admission: EM | Admit: 2021-04-10 | Discharge: 2021-04-10 | Disposition: A | Discharge status: Home / Self Care | Payer: BC Managed Care – PPO | Attending: Emergency Medicine | Admitting: Emergency Medicine

## 2021-04-10 ENCOUNTER — Encounter (HOSPITAL_BASED_OUTPATIENT_CLINIC_OR_DEPARTMENT_OTHER): Payer: Self-pay | Admitting: Emergency Medicine

## 2021-04-10 ENCOUNTER — Other Ambulatory Visit: Payer: Self-pay

## 2021-04-10 DIAGNOSIS — F17210 Nicotine dependence, cigarettes, uncomplicated: Secondary | ICD-10-CM | POA: Insufficient documentation

## 2021-04-10 DIAGNOSIS — M25561 Pain in right knee: Secondary | ICD-10-CM | POA: Insufficient documentation

## 2021-04-10 DIAGNOSIS — M25552 Pain in left hip: Secondary | ICD-10-CM | POA: Insufficient documentation

## 2021-04-10 DIAGNOSIS — I10 Essential (primary) hypertension: Secondary | ICD-10-CM | POA: Insufficient documentation

## 2021-04-10 DIAGNOSIS — M25551 Pain in right hip: Secondary | ICD-10-CM | POA: Diagnosis not present

## 2021-04-10 DIAGNOSIS — M25559 Pain in unspecified hip: Secondary | ICD-10-CM

## 2021-04-10 DIAGNOSIS — J45909 Unspecified asthma, uncomplicated: Secondary | ICD-10-CM | POA: Insufficient documentation

## 2021-04-10 MED ORDER — KETOROLAC TROMETHAMINE 60 MG/2ML IM SOLN
30.0000 mg | Freq: Once | INTRAMUSCULAR | Status: AC
  Administered 2021-04-10: 30 mg via INTRAMUSCULAR
  Filled 2021-04-10: qty 2

## 2021-04-10 MED ORDER — MELOXICAM 15 MG PO TABS: 15.0000 mg | ORAL_TABLET | Freq: Every day | ORAL | 0 refills | Status: DC

## 2021-04-10 MED ORDER — LIDOCAINE 5 % EX PTCH
3.0000 | MEDICATED_PATCH | CUTANEOUS | Status: DC
  Administered 2021-04-10: 3 via TRANSDERMAL
  Filled 2021-04-10: qty 3

## 2021-04-10 MED ORDER — LIDOCAINE 5 % EX PTCH: 1.0000 | MEDICATED_PATCH | CUTANEOUS | 0 refills | Status: DC

## 2021-04-10 NOTE — ED Triage Notes (Signed)
Pt c/o bilateral leg pain which is chronic but worse tonight.

## 2021-04-10 NOTE — Discharge Instructions (Addendum)
Do not take ibuprofen or alleve while taking mobic

## 2021-04-10 NOTE — ED Provider Notes (Signed)
[facility] EMERGENCY DEPARTMENT Provider Note   CSN: [id number] Arrival date & time: 04/10/21  0151     History Chief Complaint  Patient presents with   Leg Pain    Renee Cross is a 45 y.o. female.  The history is provided by the patient.  Leg Pain Location:  Hip and knee Time since incident:  12 months Injury: no   Hip location:  L hip and R hip Knee location:  R knee Pain details:    Quality:  Aching   Radiates to:  Does not radiate   Severity:  Severe   Onset quality:  Gradual   Duration:  12 months   Timing:  Constant   Progression:  Unchanged Chronicity:  Chronic Prior injury to area:  No Relieved by:  Nothing Worsened by:  Nothing Ineffective treatments:  NSAIDs (injections, was told she likely needed surgery secondary to severe hip degenerative changes) Associated symptoms: no back pain, no fever, no muscle weakness, no numbness and no swelling   Risk factors: no concern for non-accidental trauma       Past Medical History:  Diagnosis Date   Anemia    Asthma    GERD (gastroesophageal reflux disease)    Hypertension    Sickle cell trait (HCC)     There are no problems to display for this patient.   Past Surgical History:  Procedure Laterality Date   HERNIA REPAIR     TONSILLECTOMY     TUBAL LIGATION       OB History   No obstetric history on file.     History reviewed. No pertinent family history.  Social History   Tobacco Use   Smoking status: Every Day    Packs/day: 1.00    Pack years: 0.00    Types: Cigarettes   Smokeless tobacco: Never  Substance Use Topics   Alcohol use: Yes   Drug use: No    Home Medications Prior to Admission medications   Medication Sig Start Date End Date Taking? Authorizing Provider  lidocaine (LIDODERM) 5 % Place 1 patch onto the skin daily. Remove & Discard patch within 12 hours or as directed by MD 04/10/21  Yes Shaily Librizzi, MD  meloxicam (MOBIC) 15 MG tablet Take 1 tablet (15 mg  total) by mouth daily. Do not take ibuprofen or alleve while taking this medication 04/10/21  Yes Tiawana Forgy, MD  albuterol (PROVENTIL HFA;VENTOLIN HFA) 108 (90 BASE) MCG/ACT inhaler Inhale 1-2 puffs into the lungs every 6 (six) hours as needed for wheezing. 06/15/15   Rolland Porter, MD  albuterol (PROVENTIL) (5 MG/ML) 0.5% nebulizer solution Take 0.5 mLs (2.5 mg total) by nebulization every 6 (six) hours as needed for wheezing or shortness of breath. 07/31/18   Liberty Handy, PA-C  amoxicillin-clavulanate (AUGMENTIN) 875-125 MG tablet Take 1 tablet by mouth 2 (two) times daily. One po bid x 7 days 08/06/20   Charlynne Pander, MD  beclomethasone (QVAR) 40 MCG/ACT inhaler Inhale 2 puffs into the lungs 2 (two) times daily.    [provider]  cetirizine (ZYRTEC) 10 MG tablet Take 1 tablet (10 mg total) by mouth daily. 1 po q day prn allergies 06/15/15   Rolland Porter, MD  esomeprazole (NEXIUM) 20 MG capsule Take 20 mg by mouth daily at 12 noon.    [provider]  HYDROcodone-homatropine (HYCODAN) 5-1.5 MG/5ML syrup Take 5 mLs by mouth every 6 (six) hours as needed for cough. FOR DISRUPTIVE NIGHT TIME COUGH AND  BODY ACHES 07/31/18   Liberty Handy, PA-C  ondansetron (ZOFRAN ODT) 8 MG disintegrating tablet Take 1 tablet (8 mg total) by mouth every 8 (eight) hours as needed for nausea or vomiting. 09/25/15   Molpus, John, MD  predniSONE (DELTASONE) 10 MG tablet Take 4 tablets (40 mg total) by mouth daily. 07/31/18   Liberty Handy, PA-C    Allergies    Patient has no known allergies.  Review of Systems   Review of Systems  Constitutional:  Negative for fever.  HENT:  Negative for drooling.   Eyes:  Negative for redness.  Respiratory:  Negative for wheezing.   Cardiovascular:  Negative for leg swelling.  Gastrointestinal:  Negative for vomiting.  Genitourinary:  Negative for difficulty urinating.  Musculoskeletal:  Positive for arthralgias. Negative for back pain and  gait problem.  Skin:  Negative for rash.  Neurological:  Negative for facial asymmetry.  Psychiatric/Behavioral:  Negative for agitation.   All other systems reviewed and are negative.  Physical Exam Updated Vital Signs BP (!) 158/93   Pulse 90   Temp 97.7 F (36.5 C) (Oral)   Resp 16   Ht 5\' 6"  (1.676 m)   Wt 83 kg   SpO2 100%   BMI 29.53 kg/m   Physical Exam Vitals and nursing note reviewed.  Constitutional:      General: She is not in acute distress.    Appearance: Normal appearance.  HENT:     Head: Normocephalic and atraumatic.     Nose: Nose normal.  Eyes:     Conjunctiva/sclera: Conjunctivae normal.     Pupils: Pupils are equal, round, and reactive to light.  Cardiovascular:     Rate and Rhythm: Normal rate and regular rhythm.     Pulses: Normal pulses.     Heart sounds: Normal heart sounds.  Pulmonary:     Effort: Pulmonary effort is normal.     Breath sounds: Normal breath sounds.  Abdominal:     General: Abdomen is flat. Bowel sounds are normal.     Palpations: Abdomen is soft.     Tenderness: There is no abdominal tenderness. There is no guarding.  Musculoskeletal:        General: Normal range of motion.     Cervical back: Normal range of motion and neck supple.     Right hip: No deformity, tenderness, bony tenderness or crepitus. Normal range of motion. Normal strength.     Left hip: No deformity, tenderness, bony tenderness or crepitus. Normal range of motion. Normal strength.     Right knee: Normal. No LCL laxity, MCL laxity, ACL laxity or PCL laxity.     Instability Tests: Anterior drawer test negative.     Left knee: Normal. No LCL laxity, MCL laxity, ACL laxity or PCL laxity.    Instability Tests: Anterior drawer test negative.     Right lower leg: Normal. No edema.     Left lower leg: Normal. No edema.     Right ankle: Normal.     Right Achilles Tendon: Normal.     Left ankle: Normal.     Left Achilles Tendon: Normal.     Right foot: Normal.      Left foot: Normal.  Skin:    General: Skin is warm and dry.     Capillary Refill: Capillary refill takes less than 2 seconds.  Neurological:     General: No focal deficit present.     Mental Status: She is alert and  oriented to person, place, and time.     Deep Tendon Reflexes: Reflexes normal.  Psychiatric:        Mood and Affect: Mood normal.        Behavior: Behavior normal.    ED Results / Procedures / Treatments   Labs (all labs ordered are listed, but only abnormal results are displayed) Labs Reviewed - No data to display  EKG None  Radiology No results found.  Procedures Procedures   Medications Ordered in ED Medications  lidocaine (LIDODERM) 5 % 3 patch (has no administration in time range)  ketorolac (TORADOL) injection 30 mg (has no administration in time range)    ED Course  I have reviewed the triage vital signs and the nursing notes.  Pertinent labs & imaging results that were available during my care of the patient were reviewed by me and considered in my medical decision making (see chart for details).  Outside imaging reviewed.  Severe degeneration.  No new trauma.  No fevers no weakness.  No indication for imaging at this time.  Chronic.  Will refer to orthopedics for further evaluation.  NSAIDs and lidoderm.    Renee Cross was evaluated in Emergency Department on 04/10/2021 for the symptoms described in the history of present illness. She was evaluated in the context of the global COVID-19 pandemic, which necessitated consideration that the patient might be at risk for infection with the SARS-CoV-2 virus that causes COVID-19. Institutional protocols and algorithms that pertain to the evaluation of patients at risk for COVID-19 are in a state of rapid change based on information released by regulatory bodies including the CDC and federal and state organizations. These policies and algorithms were followed during the patient's care in the ED.     Final  Clinical Impression(s) / ED Diagnoses Final diagnoses:  Hip pain    Return for intractable cough, coughing up blood, fevers > 100.4 unrelieved by medication, shortness of breath, intractable vomiting, chest pain, shortness of breath, weakness, numbness, changes in speech, facial asymmetry, abdominal pain, passing out, Inability to tolerate liquids or food, cough, altered mental status or any concerns. No signs of systemic illness or infection. The patient is nontoxic-appearing on exam and vital signs are within normal limits. I have reviewed the triage vital signs and the nursing notes. Pertinent labs & imaging results that were available during my care of the patient were reviewed by me and considered in my medical decision making (see chart for details). After history, exam, and medical workup I feel the patient has been appropriately medically screened and is safe for discharge home. Pertinent diagnoses were discussed with the patient. Patient was given return precautions.   Rx / DC Orders ED Discharge Orders          Ordered    meloxicam (MOBIC) 15 MG tablet  Daily        04/10/21 0224    lidocaine (LIDODERM) 5 %  Every 24 hours        04/10/21 0224             Arietta Eisenstein, MD 04/10/21 1779

## 2021-04-27 ENCOUNTER — Ambulatory Visit: Payer: Self-pay | Admitting: Allergy and Immunology

## 2021-07-06 ENCOUNTER — Ambulatory Visit: Payer: BC Managed Care – PPO | Admitting: Allergy and Immunology

## 2021-07-27 ENCOUNTER — Encounter (HOSPITAL_BASED_OUTPATIENT_CLINIC_OR_DEPARTMENT_OTHER): Payer: Self-pay | Admitting: *Deleted

## 2021-07-27 ENCOUNTER — Other Ambulatory Visit: Payer: Self-pay

## 2021-07-27 ENCOUNTER — Emergency Department (HOSPITAL_BASED_OUTPATIENT_CLINIC_OR_DEPARTMENT_OTHER)
Admission: EM | Admit: 2021-07-27 | Discharge: 2021-07-27 | Disposition: A | Discharge status: Home / Self Care | Payer: BC Managed Care – PPO | Attending: Emergency Medicine | Admitting: Emergency Medicine

## 2021-07-27 DIAGNOSIS — Z20822 Contact with and (suspected) exposure to covid-19: Secondary | ICD-10-CM | POA: Insufficient documentation

## 2021-07-27 DIAGNOSIS — Z7951 Long term (current) use of inhaled steroids: Secondary | ICD-10-CM | POA: Insufficient documentation

## 2021-07-27 DIAGNOSIS — I10 Essential (primary) hypertension: Secondary | ICD-10-CM | POA: Insufficient documentation

## 2021-07-27 DIAGNOSIS — R059 Cough, unspecified: Secondary | ICD-10-CM | POA: Insufficient documentation

## 2021-07-27 DIAGNOSIS — J45909 Unspecified asthma, uncomplicated: Secondary | ICD-10-CM | POA: Insufficient documentation

## 2021-07-27 DIAGNOSIS — R6883 Chills (without fever): Secondary | ICD-10-CM | POA: Insufficient documentation

## 2021-07-27 DIAGNOSIS — J029 Acute pharyngitis, unspecified: Secondary | ICD-10-CM | POA: Insufficient documentation

## 2021-07-27 DIAGNOSIS — J209 Acute bronchitis, unspecified: Secondary | ICD-10-CM

## 2021-07-27 DIAGNOSIS — F17210 Nicotine dependence, cigarettes, uncomplicated: Secondary | ICD-10-CM | POA: Insufficient documentation

## 2021-07-27 DIAGNOSIS — M791 Myalgia, unspecified site: Secondary | ICD-10-CM | POA: Insufficient documentation

## 2021-07-27 LAB — RESP PANEL BY RT-PCR (FLU A&B, COVID) ARPGX2
Influenza A by PCR: NEGATIVE
Influenza B by PCR: NEGATIVE
SARS Coronavirus 2 by RT PCR: NEGATIVE

## 2021-07-27 MED ORDER — AZITHROMYCIN 250 MG PO TABS: ORAL_TABLET | ORAL | 0 refills | Status: DC

## 2021-07-27 MED ORDER — PREDNISONE 10 MG PO TABS: ORAL_TABLET | ORAL | 0 refills | Status: DC

## 2021-07-27 MED ORDER — PREDNISONE 50 MG PO TABS
60.0000 mg | ORAL_TABLET | Freq: Once | ORAL | Status: AC
  Administered 2021-07-27: 60 mg via ORAL
  Filled 2021-07-27: qty 1

## 2021-07-27 NOTE — ED Triage Notes (Signed)
C/o  pro cough , body aches, h/a , sore throat x 1 week

## 2021-07-27 NOTE — ED Provider Notes (Signed)
[facility] EMERGENCY DEPARTMENT Provider Note   CSN: [id number] Arrival date & time: 07/27/21  1812     History Chief Complaint  Patient presents with   Cough    Renee Cross is a 45 y.o. female hx of GERD, HTN, bronchitis here with cough.  Patient has been having coughing and body aches for the last week or so.  Also sore throat.  Some subjective chills as well. Patient states that she has history of bronchitis and usually albuterol does not help and she was requesting prednisone and Zithromax  The history is provided by the patient.      Past Medical History:  Diagnosis Date   Anemia    Asthma    GERD (gastroesophageal reflux disease)    Hypertension    Sickle cell trait (HCC)     There are no problems to display for this patient.   Past Surgical History:  Procedure Laterality Date   HERNIA REPAIR     TONSILLECTOMY     TUBAL LIGATION       OB History   No obstetric history on file.     No family history on file.  Social History   Tobacco Use   Smoking status: Every Day    Packs/day: 1.00    Types: Cigarettes   Smokeless tobacco: Never  Substance Use Topics   Alcohol use: Yes   Drug use: No    Home Medications Prior to Admission medications   Medication Sig Start Date End Date Taking? Authorizing Provider  albuterol (PROVENTIL HFA;VENTOLIN HFA) 108 (90 BASE) MCG/ACT inhaler Inhale 1-2 puffs into the lungs every 6 (six) hours as needed for wheezing. 06/15/15   Rolland Porter, MD  albuterol (PROVENTIL) (5 MG/ML) 0.5% nebulizer solution Take 0.5 mLs (2.5 mg total) by nebulization every 6 (six) hours as needed for wheezing or shortness of breath. 07/31/18   Liberty Handy, PA-C  amoxicillin-clavulanate (AUGMENTIN) 875-125 MG tablet Take 1 tablet by mouth 2 (two) times daily. One po bid x 7 days 08/06/20   Charlynne Pander, MD  beclomethasone (QVAR) 40 MCG/ACT inhaler Inhale 2 puffs into the lungs 2 (two) times daily.    [provider]  cetirizine (ZYRTEC) 10 MG tablet Take 1 tablet (10 mg total) by mouth daily. 1 po q day prn allergies 06/15/15   Rolland Porter, MD  esomeprazole (NEXIUM) 20 MG capsule Take 20 mg by mouth daily at 12 noon.    [provider]  HYDROcodone-homatropine (HYCODAN) 5-1.5 MG/5ML syrup Take 5 mLs by mouth every 6 (six) hours as needed for cough. FOR DISRUPTIVE NIGHT TIME COUGH AND BODY ACHES 07/31/18   Liberty Handy, PA-C  lidocaine (LIDODERM) 5 % Place 1 patch onto the skin daily. Remove & Discard patch within 12 hours or as directed by MD 04/10/21   Nicanor Alcon, April, MD  meloxicam (MOBIC) 15 MG tablet Take 1 tablet (15 mg total) by mouth daily. Do not take ibuprofen or alleve while taking this medication 04/10/21   Palumbo, April, MD  ondansetron (ZOFRAN ODT) 8 MG disintegrating tablet Take 1 tablet (8 mg total) by mouth every 8 (eight) hours as needed for nausea or vomiting. 09/25/15   Molpus, John, MD  predniSONE (DELTASONE) 10 MG tablet Take 4 tablets (40 mg total) by mouth daily. 07/31/18   Liberty Handy, PA-C    Allergies    Patient has no known allergies.  Review of Systems   Review of Systems  Respiratory:  Positive for cough.   All other systems reviewed and are negative.  Physical Exam Updated Vital Signs BP (!) 165/96 (BP Location: Right Arm)   Pulse 83   Temp 98.2 F (36.8 C) (Oral)   Resp 18   Ht 5\' 6"  (1.676 m)   Wt 81.6 kg   LMP 07/13/2021   SpO2 100%   BMI 29.05 kg/m   Physical Exam Vitals and nursing note reviewed.  Constitutional:      Appearance: Normal appearance.     Comments: Coughing   HENT:     Head: Normocephalic.     Nose: Nose normal.     Mouth/Throat:     Mouth: Mucous membranes are moist.  Eyes:     Extraocular Movements: Extraocular movements intact.     Pupils: Pupils are equal, round, and reactive to light.  Cardiovascular:     Rate and Rhythm: Normal rate and regular rhythm.     Pulses: Normal pulses.  Pulmonary:     Comments:  Slightly tachypneic, + mild wheezing but no crackles.  No retractions Abdominal:     General: Abdomen is flat.     Palpations: Abdomen is soft.  Musculoskeletal:        General: Normal range of motion.     Cervical back: Normal range of motion and neck supple.  Skin:    General: Skin is warm.     Capillary Refill: Capillary refill takes less than 2 seconds.  Neurological:     General: No focal deficit present.     Mental Status: She is alert and oriented to person, place, and time.  Psychiatric:        Mood and Affect: Mood normal.        Behavior: Behavior normal.    ED Results / Procedures / Treatments   Labs (all labs ordered are listed, but only abnormal results are displayed) Labs Reviewed  RESP PANEL BY RT-PCR (FLU A&B, COVID) [id number]    EKG None  Radiology No results found.  Procedures Procedures   Medications Ordered in ED Medications  predniSONE (DELTASONE) tablet 60 mg (has no administration in time range)    ED Course  I have reviewed the triage vital signs and the nursing notes.  Pertinent labs & imaging results that were available during my care of the patient were reviewed by me and considered in my medical decision making (see chart for details).    MDM Rules/Calculators/A&P                           Renee Cross is a 45 y.o. female here presenting with cough.  Patient is afebrile and has some minimal wheezing on exam.  COVID and flu tests are negative.  Offered albuterol but patient does not want it.  Requesting prednisone and azithromycin.  I gave her strict return precautions.  We will hold off on x-rays for now.  Final Clinical Impression(s) / ED Diagnoses Final diagnoses:  None    Rx / DC Orders ED Discharge Orders     None        54, MD 07/27/21 2245

## 2021-07-27 NOTE — Discharge Instructions (Signed)
Take prednisone as prescribed   Take zpack as prescribed   See your doctor   Return to ER if you have shortness of breath, worse cough, trouble breathing, fever

## 2021-07-27 NOTE — ED Notes (Signed)
Pt. Has used inhalers in the past.  She states albuterol is too strong for her and makes her have coughing spells.

## 2021-10-19 ENCOUNTER — Ambulatory Visit: Payer: BC Managed Care – PPO | Admitting: Allergy and Immunology

## 2021-10-19 ENCOUNTER — Other Ambulatory Visit: Payer: Self-pay

## 2021-10-19 ENCOUNTER — Encounter: Payer: Self-pay | Admitting: Allergy and Immunology

## 2021-10-19 VITALS — BP 130/82 | HR 107 | Temp 97.9°F | Resp 16 | Ht 66.0 in | Wt 180.8 lb

## 2021-10-19 DIAGNOSIS — J4550 Severe persistent asthma, uncomplicated: Secondary | ICD-10-CM | POA: Diagnosis not present

## 2021-10-19 DIAGNOSIS — K219 Gastro-esophageal reflux disease without esophagitis: Secondary | ICD-10-CM | POA: Diagnosis not present

## 2021-10-19 DIAGNOSIS — J3089 Other allergic rhinitis: Secondary | ICD-10-CM | POA: Diagnosis not present

## 2021-10-19 DIAGNOSIS — F17210 Nicotine dependence, cigarettes, uncomplicated: Secondary | ICD-10-CM

## 2021-10-19 DIAGNOSIS — J301 Allergic rhinitis due to pollen: Secondary | ICD-10-CM

## 2021-10-19 DIAGNOSIS — Z8709 Personal history of other diseases of the respiratory system: Secondary | ICD-10-CM

## 2021-10-19 MED ORDER — ALBUTEROL SULFATE HFA 108 (90 BASE) MCG/ACT IN AERS: 1.0000 | INHALATION_SPRAY | Freq: Four times a day (QID) | RESPIRATORY_TRACT | 1 refills | Status: DC | PRN

## 2021-10-19 MED ORDER — BREZTRI AEROSPHERE 160-9-4.8 MCG/ACT IN AERO: 2.0000 | INHALATION_SPRAY | Freq: Two times a day (BID) | RESPIRATORY_TRACT | 5 refills | Status: DC

## 2021-10-19 MED ORDER — OMEPRAZOLE 40 MG PO CPDR: 40.0000 mg | DELAYED_RELEASE_CAPSULE | Freq: Two times a day (BID) | ORAL | 5 refills | Status: DC

## 2021-10-19 MED ORDER — LORATADINE 10 MG PO TABS: 10.0000 mg | ORAL_TABLET | Freq: Two times a day (BID) | ORAL | 5 refills | Status: DC | PRN

## 2021-10-19 MED ORDER — FLUTICASONE PROPIONATE 50 MCG/ACT NA SUSP: 1.0000 | Freq: Two times a day (BID) | NASAL | 5 refills | Status: DC

## 2021-10-19 NOTE — Patient Instructions (Addendum)
°  1.  Allergen avoidance measures -dust, mold, pollens  2.  Use nicotine substitutes to replace tobacco smoke exposure  3.  Treat and prevent inflammation:  A. Breztri - 2 inhalations 2 times per day (empty lungs) B. Flonase / fluticasone - 1 spray each nostril 2 times per day  4. Treat and prevent reflux:  A. Omeprazole 40 mg - 1 tablet 2 times per day B. Minimize chocolate consumption  5. If needed:   A. Albuterol HFA - 2 inhalations every 4-6 hours B. Loratadine 10 mg - 1 tablet 1-2 times per day  6. Biologic agent??? Blood - CBC w/d, area 2 aeroallergen profile.   7. Return to clinic in 2 weeks or earlier if problem.

## 2021-10-19 NOTE — Progress Notes (Addendum)
[place] [facility] [place] [facility] [place] [facility] [place] [facility] [place]    NEW PATIENT NOTE  Referring Provider: No ref. provider found Primary Provider: Pcp, No Date of office visit: 10/19/2021    Subjective:   Chief Complaint:  Renee Cross (DOB: [birth year]) is a 46 y.o. female who presents to the clinic on 10/19/2021 with a chief complaint of Asthma, Allergy Testing (Environmental/Food?), Eczema, and Breathing Problem .     HPI: Seerit presents to this clinic in evaluation of breathing problems.  I had apparently seen her in this clinic over a decade ago for similar issues.  She has wheezing and coughing and shortness of breath and must use a bronchodilator multiple times per week especially at nighttime.  She has been on a multitude of different medications in treatment of this issue and currently is using Advair consistently.  She states that she require systemic steroids about 3 times per year to treat this issue.  She required an emergency room evaluation on 27 July 2021.  When she was skin tested in the past she was allergic to dust mite and mold and pollens.  She is smoking at this point in time.  She smokes about a half a pack of cigarettes per day.  She has problems with nasal congestion and sneezing.  She has a history of nasal polyps and had surgery for these polyps greater than 10 years ago.  She recently saw an ENT doctor who recommended a CT scan which was not performed.  She states that she receives antibiotics about 3 times per year in the treatment of sinusitis.  She does not have anosmia.  She has problems with eczema involving her back and arm and chest for which she uses an over-the-counter cream occasionally.  Sometimes this does flareup and her over-the-counter cream does not help.  She has active reflux with regurgitation and heartburn.  She was given some pills in the past which helped somewhat but never completely eliminated this issue.  She does not consume caffeine.   She occasionally has chocolate.  Past Medical History:  Diagnosis Date   Anemia    Asthma    GERD (gastroesophageal reflux disease)    Hypertension    Sickle cell trait (HCC)     Past Surgical History:  Procedure Laterality Date   HERNIA REPAIR     TONSILLECTOMY     TUBAL LIGATION      Allergies as of 10/19/2021   No Known Allergies      Medication List    albuterol 108 (90 Base) MCG/ACT inhaler Commonly known as: VENTOLIN HFA Inhale 1-2 puffs into the lungs every 6 (six) hours as needed for wheezing.   albuterol (5 MG/ML) 0.5% nebulizer solution Commonly known as: PROVENTIL Take 0.5 mLs (2.5 mg total) by nebulization every 6 (six) hours as needed for wheezing or shortness of breath.   cetirizine 10 MG tablet Commonly known as: ZYRTEC Take 1 tablet (10 mg total) by mouth daily. 1 po q day prn allergies   esomeprazole 20 MG capsule Commonly known as: NEXIUM Take 20 mg by mouth daily at 12 noon.    Review of systems negative except as noted in HPI / PMHx or noted below:  Review of Systems  Constitutional: Negative.   HENT: Negative.    Eyes: Negative.   Respiratory: Negative.    Cardiovascular: Negative.   Gastrointestinal: Negative.   Genitourinary: Negative.   Musculoskeletal: Negative.   Skin: Negative.   Neurological: Negative.   Endo/Heme/Allergies:  Negative.   Psychiatric/Behavioral: Negative.     History reviewed. No pertinent family history.  Social History   Socioeconomic History   Marital status: Single    Spouse name: Not on file   Number of children: Not on file   Years of education: Not on file   Highest education level: Not on file  Occupational History   Not on file  Tobacco Use   Smoking status: Every Day    Packs/day: 1.00    Types: Cigarettes    Passive exposure: Never   Smokeless tobacco: Never  Substance and Sexual Activity   Alcohol use: Yes   Drug use: No   Sexual activity: Yes    Birth control/protection: Surgical   Other Topics Concern   Not on file  Social History Narrative   Not on file   Environmental and Social history  Lives in a house with a dry environment, dog located inside the household, no carpet in the bedroom, no plastic on the bed, no plastic on the pillow, actively smoking tobacco products, and 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  Objective:   Vitals:   10/19/21 0953  BP: 130/82  Pulse: (!) 107  Resp: 16  Temp: 97.9 F (36.6 C)  SpO2: 99%   Height:  (167.6 cm) Weight: 180 lb 12.8 oz (82 kg)  Physical Exam Constitutional:      Appearance: She is not diaphoretic.  HENT:     Head: Normocephalic.     Right Ear: Tympanic membrane, ear canal and external ear normal.     Left Ear: Tympanic membrane, ear canal and external ear normal.     Nose: Nose normal. No mucosal edema or rhinorrhea.     Mouth/Throat:     Pharynx: Uvula midline. No oropharyngeal exudate.  Eyes:     Conjunctiva/sclera: Conjunctivae normal.  Neck:     Thyroid: No thyromegaly.     Trachea: Trachea normal. No tracheal tenderness or tracheal deviation.  Cardiovascular:     Rate and Rhythm: Normal rate and regular rhythm.     Heart sounds: Normal heart sounds, S1 normal and S2 normal. No murmur heard. Pulmonary:     Effort: No respiratory distress.     Breath sounds: Normal breath sounds. No stridor. No wheezing or rales.  Lymphadenopathy:     Head:     Right side of head: No tonsillar adenopathy.     Left side of head: No tonsillar adenopathy.     Cervical: No cervical adenopathy.  Skin:    Findings: No erythema or rash.     Nails: There is no clubbing.  Neurological:     Mental Status: She is alert.    Diagnostics: Allergy skin tests were not performed.   Spirometry was performed and demonstrated an FEV1 of 1.24 @ 47 % of predicted. FEV1/FVC = 0.58.  Following ministration nebulized albuterol her FEV1 increased to 2.09 which a calculated increase in her FEV1 of 69%.  Assessment and  Plan:    1. Not well controlled severe persistent asthma   2. Perennial allergic rhinitis   3. Seasonal allergic rhinitis due to pollen   4. History of nasal polyposis   5. Gastroesophageal reflux disease, unspecified whether esophagitis present   6. Light tobacco smoker <10 cigarettes per day     1.  Allergen avoidance measures -dust, mold, pollens  2.  Use nicotine substitutes to replace tobacco smoke exposure  3.  Treat and prevent inflammation:  A. Breztri -  2 inhalations 2 times per day (empty lungs) B. Flonase / fluticasone - 1 spray each nostril 2 times per day  4. Treat and prevent reflux:  A. Omeprazole 40 mg - 1 tablet 2 times per day B. Minimize chocolate consumption  5. If needed:   A. Albuterol HFA - 2 inhalations every 4-6 hours B. Loratadine 10 mg - 1 tablet 1-2 times per day  6. Biologic agent??? Blood - CBC w/d, area 2 aeroallergen profile.   7. Return to clinic in 2 weeks or earlier if problem.  Adair LaundryLatonya has failed medical therapy for her respiratory tract inflammatory condition and she would now be a candidate for a biologic agent.  We will screen her blood to see which biologic agent she qualifies for and I will contact her with the results of these blood test once they are available for review.  She will utilize a selection of anti-inflammatory agents for her airway and continue to treat her reflux as noted above.  I will see her back in this clinic in 2 weeks or earlier if there is a problem.  Jessica PriestEric J. Icarus Partch, MD Allergy / Immunology [facility]

## 2021-10-20 ENCOUNTER — Encounter: Payer: Self-pay | Admitting: Allergy and Immunology

## 2021-10-22 LAB — CBC WITH DIFFERENTIAL/PLATELET
Basophils Absolute: 0 10*3/uL (ref 0.0–0.2)
Basos: 1 %
EOS (ABSOLUTE): 0.2 10*3/uL (ref 0.0–0.4)
Eos: 3 %
Hematocrit: 35.7 % (ref 34.0–46.6)
Hemoglobin: 10.4 g/dL — ABNORMAL LOW (ref 11.1–15.9)
Immature Grans (Abs): 0 10*3/uL (ref 0.0–0.1)
Immature Granulocytes: 0 %
Lymphocytes Absolute: 1.5 10*3/uL (ref 0.7–3.1)
Lymphs: 24 %
MCH: 20.7 pg — ABNORMAL LOW (ref 26.6–33.0)
MCHC: 29.1 g/dL — ABNORMAL LOW (ref 31.5–35.7)
MCV: 71 fL — ABNORMAL LOW (ref 79–97)
Monocytes Absolute: 0.5 10*3/uL (ref 0.1–0.9)
Monocytes: 8 %
Neutrophils Absolute: 4 10*3/uL (ref 1.4–7.0)
Neutrophils: 64 %
Platelets: 246 10*3/uL (ref 150–450)
RBC: 5.03 x10E6/uL (ref 3.77–5.28)
RDW: 20.5 % — ABNORMAL HIGH (ref 11.7–15.4)
WBC: 6.2 10*3/uL (ref 3.4–10.8)

## 2021-10-22 LAB — ALLERGENS W/TOTAL IGE AREA 2
Alternaria Alternata IgE: <0.1 kU/L
Aspergillus Fumigatus IgE: <0.1 kU/L
Bermuda Grass IgE: <0.1 kU/L
Cat Dander IgE: <0.1 kU/L
Cedar, Mountain IgE: <0.1 kU/L
Cladosporium Herbarum IgE: <0.1 kU/L
Cockroach, German IgE: <0.1 kU/L
Common Silver Birch IgE: <0.1 kU/L
Cottonwood IgE: <0.1 kU/L
D Farinae IgE: <0.1 kU/L
D Pteronyssinus IgE: <0.1 kU/L
Dog Dander IgE: <0.1 kU/L
Elm, American IgE: <0.1 kU/L
IgE (Immunoglobulin E), Serum: 502 IU/mL — ABNORMAL HIGH (ref 6–495)
Johnson Grass IgE: <0.1 kU/L
Maple/Box Elder IgE: <0.1 kU/L
Mouse Urine IgE: <0.1 kU/L
Oak, White IgE: <0.1 kU/L
Pecan, Hickory IgE: <0.1 kU/L
Penicillium Chrysogen IgE: <0.1 kU/L
Pigweed, Rough IgE: <0.1 kU/L
Ragweed, Short IgE: <0.1 kU/L
Sheep Sorrel IgE Qn: <0.1 kU/L
Timothy Grass IgE: <0.1 kU/L
White Mulberry IgE: <0.1 kU/L

## 2021-11-01 ENCOUNTER — Telehealth: Payer: Self-pay | Admitting: *Deleted

## 2021-11-01 NOTE — Telephone Encounter (Signed)
Patient was denied approval for Berna Bue due to no documentation of two or more exacerbations requiring steroids in last 12 months.  Patient had appt for tomorrow.  I could only find one occasion of prednisone given in last year so I can't really appeal same with Ins based on that

## 2021-11-02 ENCOUNTER — Telehealth: Payer: Self-pay | Admitting: Allergy and Immunology

## 2021-11-02 ENCOUNTER — Other Ambulatory Visit: Payer: Self-pay | Admitting: *Deleted

## 2021-11-02 ENCOUNTER — Ambulatory Visit: Payer: BC Managed Care – PPO | Admitting: Allergy and Immunology

## 2021-11-02 DIAGNOSIS — J309 Allergic rhinitis, unspecified: Secondary | ICD-10-CM

## 2021-11-02 MED ORDER — OLOPATADINE HCL 0.2 % OP SOLN: 1.0000 [drp] | Freq: Every day | OPHTHALMIC | 5 refills | Status: DC

## 2021-11-02 NOTE — Telephone Encounter (Signed)
Patient is requesting for Dr. Neldon Mc to send in some eyedrops for her.   [facility] - [phone]

## 2021-11-02 NOTE — Telephone Encounter (Signed)
Please provide Laurena Pataday 1 drop each eye 1 time per day or if your insurance will pay for Bepreve 1 drop each eye twice a day

## 2021-11-02 NOTE — Telephone Encounter (Signed)
Pataday eyedrops have been sent in to requested pharmacy. Called and left a detailed voicemail per DPR permission advising patient of medication being sent in.

## 2021-11-30 ENCOUNTER — Emergency Department (HOSPITAL_BASED_OUTPATIENT_CLINIC_OR_DEPARTMENT_OTHER)
Admission: EM | Admit: 2021-11-30 | Discharge: 2021-12-01 | Disposition: A | Discharge status: Home / Self Care | Payer: BC Managed Care – PPO | Attending: Emergency Medicine | Admitting: Emergency Medicine

## 2021-11-30 ENCOUNTER — Encounter (HOSPITAL_BASED_OUTPATIENT_CLINIC_OR_DEPARTMENT_OTHER): Payer: Self-pay

## 2021-11-30 ENCOUNTER — Other Ambulatory Visit: Payer: Self-pay

## 2021-11-30 DIAGNOSIS — R059 Cough, unspecified: Secondary | ICD-10-CM | POA: Diagnosis present

## 2021-11-30 DIAGNOSIS — J45909 Unspecified asthma, uncomplicated: Secondary | ICD-10-CM | POA: Insufficient documentation

## 2021-11-30 DIAGNOSIS — Z20822 Contact with and (suspected) exposure to covid-19: Secondary | ICD-10-CM | POA: Insufficient documentation

## 2021-11-30 DIAGNOSIS — J069 Acute upper respiratory infection, unspecified: Secondary | ICD-10-CM | POA: Diagnosis not present

## 2021-11-30 DIAGNOSIS — Z7951 Long term (current) use of inhaled steroids: Secondary | ICD-10-CM | POA: Diagnosis not present

## 2021-11-30 LAB — RESP PANEL BY RT-PCR (FLU A&B, COVID) ARPGX2
Influenza A by PCR: NEGATIVE
Influenza B by PCR: NEGATIVE
SARS Coronavirus 2 by RT PCR: NEGATIVE

## 2021-11-30 NOTE — ED Triage Notes (Signed)
Pt c/o flu like sx x 3 days-NAD-steady gait ?

## 2021-12-01 MED ORDER — DEXAMETHASONE 4 MG PO TABS
10.0000 mg | ORAL_TABLET | Freq: Once | ORAL | Status: AC
  Administered 2021-12-01: 10 mg via ORAL
  Filled 2021-12-01: qty 3

## 2021-12-01 MED ORDER — IPRATROPIUM-ALBUTEROL 0.5-2.5 (3) MG/3ML IN SOLN
3.0000 mL | Freq: Once | RESPIRATORY_TRACT | Status: AC
  Administered 2021-12-01: 3 mL via RESPIRATORY_TRACT
  Filled 2021-12-01: qty 3

## 2021-12-01 MED ORDER — IBUPROFEN 200 MG PO TABS
600.0000 mg | ORAL_TABLET | Freq: Once | ORAL | Status: AC
  Administered 2021-12-01: 600 mg via ORAL
  Filled 2021-12-01: qty 1

## 2021-12-01 MED ORDER — BENZONATATE 100 MG PO CAPS: 100.0000 mg | ORAL_CAPSULE | Freq: Three times a day (TID) | ORAL | 0 refills | Status: DC | PRN

## 2021-12-01 MED ORDER — BENZONATATE 100 MG PO CAPS
100.0000 mg | ORAL_CAPSULE | Freq: Once | ORAL | Status: AC
  Administered 2021-12-01: 100 mg via ORAL
  Filled 2021-12-01: qty 1

## 2021-12-01 NOTE — ED Provider Notes (Signed)
?[facility] EMERGENCY DEPARTMENT ?Provider Note ? ? ?CSN: [id number] ?Arrival date & time: 11/30/21  2246 ? ?  ? ?History ? ?Chief Complaint  ?Patient presents with  ? Cough  ? ? ?Renee Cross is a 46 y.o. female. ? ?The history is provided by the patient.  ?Cough ?Renee Cross is a 46 y.o. female who presents to the Emergency Department complaining of cough. She presents to the ED for evaluation of one week of cough, headache, ear popping.  Sxs have worsened since Sunday.  She has occasional post tussive emesis.  Has associated occasional N/V.  No fever.  Has mild runny nose.  No sore throat.  Cough productive of clear sputum.   ? ?Has a hx/o asthma.  Uses albuterol and breztri.   ? ?Works in [employer].   ? ?  ? ?Home Medications ?Prior to Admission medications   ?Medication Sig Start Date End Date Taking? Authorizing Provider  ?benzonatate (TESSALON) 100 MG capsule Take 1 capsule (100 mg total) by mouth 3 (three) times daily as needed for cough. 12/01/21  Yes Tilden Fossa, MD  ?albuterol (PROVENTIL) (5 MG/ML) 0.5% nebulizer solution Take 0.5 mLs (2.5 mg total) by nebulization every 6 (six) hours as needed for wheezing or shortness of breath. ?Patient not taking: Reported on 10/19/2021 07/31/18   Liberty Handy, PA-C  ?albuterol (VENTOLIN HFA) 108 (90 Base) MCG/ACT inhaler Inhale 1-2 puffs into the lungs every 6 (six) hours as needed for wheezing. 10/19/21   Kozlow, Alvira Philips, MD  ?beclomethasone (QVAR) 40 MCG/ACT inhaler Inhale 2 puffs into the lungs 2 (two) times daily. ?Patient not taking: Reported on 10/19/2021    [provider]  ?Budeson-Glycopyrrol-Formoterol (BREZTRI AEROSPHERE) 160-9-4.8 MCG/ACT AERO Inhale 2 puffs into the lungs in the morning and at bedtime. 10/19/21   Kozlow, Alvira Philips, MD  ?cetirizine (ZYRTEC) 10 MG tablet Take 1 tablet (10 mg total) by mouth daily. 1 po q day prn allergies 06/15/15   Rolland Porter, MD  ?fluticasone [facility]) 50 MCG/ACT nasal spray Place 1 spray  into both nostrils 2 (two) times daily. 10/19/21   Kozlow, Alvira Philips, MD  ?loratadine (CLARITIN) 10 MG tablet Take 1 tablet (10 mg total) by mouth 2 (two) times daily as needed for allergies (Can take an extra dose during flare ups.). 10/19/21   Kozlow, Alvira Philips, MD  ?Olopatadine HCl (PATADAY) 0.2 % SOLN Place 1 drop into both eyes daily. 11/02/21   Kozlow, Alvira Philips, MD  ?omeprazole (PRILOSEC) 40 MG capsule Take 1 capsule (40 mg total) by mouth 2 (two) times daily. 10/19/21   Kozlow, Alvira Philips, MD  ?   ? ?Allergies    ?Patient has no known allergies.   ? ?Review of Systems   ?Review of Systems  ?Respiratory:  Positive for cough.   ?All other systems reviewed and are negative. ? ?Physical Exam ?Updated Vital Signs ?BP (!) 152/94 (BP Location: Left Arm)   Pulse 88   Temp 98.5 ?F (36.9 ?C) (Oral)   Resp 19   Ht  (1.676 m)   Wt 79.8 kg   LMP 11/27/2021   SpO2 99%   BMI 28.41 kg/m?  ?Physical Exam ?Vitals and nursing note reviewed.  ?Constitutional:   ?   Appearance: She is well-developed.  ?HENT:  ?   Head: Normocephalic and atraumatic.  ?Cardiovascular:  ?   Rate and Rhythm: Normal rate and regular rhythm.  ?   Heart sounds: No murmur heard. ?Pulmonary:  ?  Effort: Pulmonary effort is normal. No respiratory distress.  ?   Comments: Occasional expiratory wheeze in bilateral bases.  ?Abdominal:  ?   Palpations: Abdomen is soft.  ?   Tenderness: There is no abdominal tenderness. There is no guarding or rebound.  ?Musculoskeletal:     ?   General: No swelling or tenderness.  ?Skin: ?   General: Skin is warm and dry.  ?Neurological:  ?   Mental Status: She is alert and oriented to person, place, and time.  ?Psychiatric:     ?   Behavior: Behavior normal.  ? ? ?ED Results / Procedures / Treatments   ?Labs ?(all labs ordered are listed, but only abnormal results are displayed) ?Labs Reviewed  ?RESP PANEL BY RT-PCR (FLU A&B, COVID) [id number]  ? ? ?EKG ?None ? ?Radiology ?No results found. ? ?Procedures ?Procedures   ? ? ?Medications Ordered in ED ?Medications  ?ipratropium-albuterol (DUONEB) 0.5-2.5 (3) MG/3ML nebulizer solution 3 mL (3 mLs Nebulization Given 12/01/21 0105)  ?dexamethasone (DECADRON) tablet 10 mg (10 mg Oral Given 12/01/21 0043)  ?benzonatate (TESSALON) capsule 100 mg (100 mg Oral Given 12/01/21 0043)  ?ibuprofen (ADVIL) tablet 600 mg (600 mg Oral Given 12/01/21 0054)  ? ? ?ED Course/ Medical Decision Making/ A&P ?  ?                        ?Medical Decision Making ?Risk ?Prescription drug management. ? ? ?Patient with history of asthma here for evaluation of 1 week of cough, headache with occasional posttussive emesis.  She is nontoxic-appearing on evaluation with no respiratory distress.  On initial exam patient did have occasional wheezes.  She was treated with Decadron as well as Tessalon and albuterol in the emergency department.  On reassessment she is feeling improved.  Repeat lung exam with clear lungs bilaterally.  Current clinical picture is not consistent with hypertensive emergency, serious bacterial infection, CHF, PE.  Discussed with patient home care for viral URI with outpatient follow-up, supportive measures at home as well as return precautions. ? ? ? ? ? ? ? ?Final Clinical Impression(s) / ED Diagnoses ?Final diagnoses:  ?Viral URI with cough  ? ? ?Rx / DC Orders ?ED Discharge Orders   ? ?      Ordered  ?  benzonatate (TESSALON) 100 MG capsule  3 times daily PRN       ? 12/01/21 0133  ? ?  ?  ? ?  ? ? ?  ?Tilden Fossa, MD ?12/01/21 [phone] ? ?

## 2021-12-06 ENCOUNTER — Encounter: Payer: Self-pay | Admitting: Family

## 2021-12-06 ENCOUNTER — Other Ambulatory Visit: Payer: Self-pay

## 2021-12-06 ENCOUNTER — Ambulatory Visit (INDEPENDENT_AMBULATORY_CARE_PROVIDER_SITE_OTHER): Payer: BC Managed Care – PPO | Admitting: Family

## 2021-12-06 VITALS — BP 144/80 | HR 95 | Temp 98.1°F | Resp 24 | Ht 66.0 in | Wt 173.6 lb

## 2021-12-06 DIAGNOSIS — R058 Other specified cough: Secondary | ICD-10-CM

## 2021-12-06 DIAGNOSIS — Z8709 Personal history of other diseases of the respiratory system: Secondary | ICD-10-CM

## 2021-12-06 DIAGNOSIS — J3089 Other allergic rhinitis: Secondary | ICD-10-CM | POA: Diagnosis not present

## 2021-12-06 DIAGNOSIS — J4551 Severe persistent asthma with (acute) exacerbation: Secondary | ICD-10-CM | POA: Diagnosis not present

## 2021-12-06 DIAGNOSIS — J301 Allergic rhinitis due to pollen: Secondary | ICD-10-CM

## 2021-12-06 DIAGNOSIS — F17210 Nicotine dependence, cigarettes, uncomplicated: Secondary | ICD-10-CM | POA: Diagnosis not present

## 2021-12-06 DIAGNOSIS — K219 Gastro-esophageal reflux disease without esophagitis: Secondary | ICD-10-CM

## 2021-12-06 MED ORDER — LORATADINE 10 MG PO TABS: 10.0000 mg | ORAL_TABLET | Freq: Two times a day (BID) | ORAL | 5 refills | Status: DC | PRN

## 2021-12-06 NOTE — Patient Instructions (Signed)
?  1.  Allergen avoidance measures -dust, mold, pollens ? ?2.  Use nicotine substitutes to replace tobacco smoke exposure ? ?3.  Treat and prevent inflammation: ? ?A. Breztri - 2 inhalations 2 times per day with spacer (empty lungs). Prescription sent ?B. Flonase / fluticasone - 1 spray each nostril 2 times per day ? ?4. Treat and prevent reflux: ? ?A. Omeprazole 40 mg - 1 tablet 2 times per day ?B. Minimize chocolate consumption ? ?5. If needed:  ? ?A. Albuterol HFA - 2 inhalations every 4-6 hours ?B. Loratadine 10 mg - 1 tablet 1-2 times per day ? ?6. Biologic agent??? Will speak with Tammy, our biologics coordinator to see if we can get one approved ?Get  STAT PA/Lateral chest x-ray due to cough. We will call you with results once they are back ?Start prednisone 10 mg taking 2 tablets twice a day for 3 days, then on the fourth day take 2 tablets in the morning, and on the fifth day take 1 tablet and stop ? ?7. Return to clinic in 2-4 weeks with Dr. Lucie Leather or earlier if problem. ?

## 2021-12-06 NOTE — Progress Notes (Signed)
? ?[address] ?[place] [address] ?Dept: [phone] ? ?FOLLOW UP NOTE ? ?Patient ID: Renee Cross, female    DOB: 1976-08-15  Age: 46 y.o. MRN: [id number] ?Date of Office Visit: 12/06/2021 ? ?Assessment  ?Chief Complaint: Follow-up, Other (Went to the hospital and they said it was a virus. Given medication but mucous is still thick and it is hard to breathe. OTC medication does not work for her to clear it up.), and Asthma (Doing alright. Will act up if she does too much coughing.) ? ?HPI ?Renee Cross is a 46 year old female who presents today for follow-up of not well controlled severe persistent asthma, perennial allergic rhinitis, seasonal allergic rhinitis, history of nasal polyposis, gastroesophageal reflux disease, and light tobacco smoker less than 10 cigarettes/day.  Since her last office visit she reports that she was diagnosed with bronchitis.  She denies any new surgeries. ? ?Severe persistent asthma is reported as not well controlled with Breztri 2 puffs twice a day without a spacer and albuterol as needed.  She reports that she was recently diagnosed with bronchitis.  After looking through Epic she went to the emergency room on February 28 and was diagnosed with a viral upper respiratory infection with cough.  She was given DuoNeb, Decadron, ibuprofen, and Tessalon Perles.  She reports a productive cough that is thick "snotty yellow" or clear in color, wheezing when she is coughing too much, little bit of shortness of breath when she is coughing too much, and nocturnal awakenings due to coughing.  She is using her albuterol inhaler every 4-6 hours sometimes since last week.  She has made 1 trip to the emergency room due to breathing problems and has been on 1 round of steroids since we last saw her.  She was denied approval for Harrington Challenger due to no documentation of 2 or more exacerbations requiring steroids in the past 12 months. She denies fever or chills. ? ?Seasonal and perennial  allergic rhinitis is reported as not well controlled with Flonase nasal spray as needed.  She is currently out of loratadine.  She reports clear rhinorrhea and nasal congestion since being sick last week.  She also reports clear thick postnasal drip.  She has not had any sinus infections since we last saw her. ? ?Gastroesophageal reflux disease is reported as controlled with omeprazole 40 mg 1 tablet twice a day.  Reports that she sometimes has reflux symptoms, but it is "all right". ? ?She continues to smoke less than half a pack per day.  Discussed the importance of stopping her smoking ? ? ?Drug Allergies:  ?No Known Allergies ? ?Review of Systems: ?Review of Systems  ?Constitutional:  Negative for chills and fever.  ?HENT:    ?     Reports clear rhinorrhea, nasal congestion, and postnasal drip  ?Eyes:   ?     Reports itchy watery eyes  ?Respiratory:  Positive for cough, shortness of breath and wheezing.   ?     Reports cough that can be productive that is thick "snotty yellow" or clear.  Also reports wheezing when coughing too much, shortness of breath if coughing too much and nocturnal awakenings due to cough.  Denies tightness in chest.  ?Cardiovascular:   ?     Denies chest pain and palpitations  ?Gastrointestinal:   ?     Denies heartburn or reflux symptoms  ?Genitourinary:  Negative for frequency.  ?Musculoskeletal:   ?     Reports body aches every day due  to surgery on her hip  ?Skin:  Negative for itching and rash.  ?Neurological:  Positive for headaches.  ?     Reports lightheadedness  ?Endo/Heme/Allergies:  Positive for environmental allergies.  ? ? ?Physical Exam: ?BP (!) 144/80   Pulse 95   Temp 98.1 ?F (36.7 ?C) (Temporal)   Resp (!) 24   Ht  (1.676 m)   Wt 173 lb 9.6 oz (78.7 kg)   LMP 11/27/2021   SpO2 100%   BMI 28.02 kg/m?   ? ?Physical Exam ?Constitutional:   ?   Appearance: Normal appearance.  ?HENT:  ?   Head: Normocephalic and atraumatic.  ?   Comments: Pharynx normal, eyes  normal, ears normal, nose: Bilateral lower turbinates moderately edematous and pale with clear drainage noted ?   Right Ear: Tympanic membrane, ear canal and external ear normal.  ?   Left Ear: Tympanic membrane, ear canal and external ear normal.  ?   Mouth/Throat:  ?   Mouth: Mucous membranes are moist.  ?   Pharynx: Oropharynx is clear.  ?Eyes:  ?   Conjunctiva/sclera: Conjunctivae normal.  ?Cardiovascular:  ?   Rate and Rhythm: Regular rhythm.  ?   Heart sounds: Normal heart sounds.  ?Pulmonary:  ?   Effort: Pulmonary effort is normal.  ?   Breath sounds: Normal breath sounds.  ?   Comments: Lungs clear to auscultation ?Musculoskeletal:  ?   Cervical back: Neck supple.  ?Skin: ?   General: Skin is warm.  ?Neurological:  ?   Mental Status: She is alert and oriented to person, place, and time.  ?Psychiatric:     ?   Mood and Affect: Mood normal.     ?   Behavior: Behavior normal.     ?   Thought Content: Thought content normal.     ?   Judgment: Judgment normal.  ? ? ?Diagnostics: ?FVC 2.77 L, FEV1 1.81 L (68%).  Predicted FVC 3.27 L, predicted.  Spirometry indicates possible moderate obstruction. ? ?Assessment and Plan: ?1. Severe persistent asthma with (acute) exacerbation   ?2. Perennial allergic rhinitis   ?3. Seasonal allergic rhinitis due to pollen   ?4. Gastroesophageal reflux disease, unspecified whether esophagitis present   ?5. Light tobacco smoker <10 cigarettes per day   ?6. History of nasal polyposis   ?7. Productive cough   ? ? ?Meds ordered this encounter  ?Medications  ? loratadine (CLARITIN) 10 MG tablet  ?  Sig: Take 1 tablet (10 mg total) by mouth 2 (two) times daily as needed for allergies (Can take an extra dose during flare ups.).  ?  Dispense:  60 tablet  ?  Refill:  5  ? ? ?Patient Instructions  ? ?1.  Allergen avoidance measures -dust, mold, pollens ? ?2.  Use nicotine substitutes to replace tobacco smoke exposure ? ?3.  Treat and prevent inflammation: ? ?A. Breztri - 2 inhalations 2 times  per day with spacer (empty lungs). Prescription sent ?B. Flonase / fluticasone - 1 spray each nostril 2 times per day ? ?4. Treat and prevent reflux: ? ?A. Omeprazole 40 mg - 1 tablet 2 times per day ?B. Minimize chocolate consumption ? ?5. If needed:  ? ?A. Albuterol HFA - 2 inhalations every 4-6 hours ?B. Loratadine 10 mg - 1 tablet 1-2 times per day ? ?6. Biologic agent??? Will speak with Tammy, our biologics coordinator to see if we can get one approved ?Get  STAT PA/Lateral chest x-ray  due to cough. We will call you with results once they are back ?Start prednisone 10 mg taking 2 tablets twice a day for 3 days, then on the fourth day take 2 tablets in the morning, and on the fifth day take 1 tablet and stop ? ?7. Return to clinic in 2-4 weeks with Dr. Lucie LeatherKozlow or earlier if problem. ? ?Return in about 4 weeks (around 01/03/2022), or if symptoms worsen or fail to improve. ?  ? ?Thank you for the opportunity to care for this patient.  Please do not hesitate to contact me with questions. ? ?Nehemiah Settlehristine Zara Wendt, FNP ?[facility] ? ? ? ? ?

## 2021-12-14 ENCOUNTER — Telehealth: Payer: Self-pay | Admitting: *Deleted

## 2021-12-14 NOTE — Telephone Encounter (Signed)
Called patient and advised approval copay card and submit for Fasenra to [employer]. Will reach out once delivery set to make app to start therapy ?

## 2021-12-14 NOTE — Telephone Encounter (Signed)
-----   Message from Nehemiah Settle, FNP sent at 12/06/2021  6:33 PM EST ----- ?Can we try for approval of Fasenra. She has been on 2 rounds of steroids since her last office visit. ?

## 2021-12-14 NOTE — Telephone Encounter (Signed)
Thank you :)

## 2021-12-28 DIAGNOSIS — R768 Other specified abnormal immunological findings in serum: Secondary | ICD-10-CM

## 2021-12-28 DIAGNOSIS — D509 Iron deficiency anemia, unspecified: Secondary | ICD-10-CM

## 2021-12-28 HISTORY — DX: Iron deficiency anemia, unspecified: D50.9

## 2021-12-28 HISTORY — DX: Other specified abnormal immunological findings in serum: R76.8

## 2022-01-10 ENCOUNTER — Ambulatory Visit: Payer: BC Managed Care – PPO

## 2022-01-14 ENCOUNTER — Ambulatory Visit (INDEPENDENT_AMBULATORY_CARE_PROVIDER_SITE_OTHER): Payer: BC Managed Care – PPO | Admitting: *Deleted

## 2022-01-14 DIAGNOSIS — J4550 Severe persistent asthma, uncomplicated: Secondary | ICD-10-CM | POA: Diagnosis not present

## 2022-01-14 DIAGNOSIS — J4551 Severe persistent asthma with (acute) exacerbation: Secondary | ICD-10-CM

## 2022-01-14 MED ORDER — BENRALIZUMAB 30 MG/ML ~~LOC~~ SOSY
30.0000 mg | PREFILLED_SYRINGE | Freq: Once | SUBCUTANEOUS | Status: AC
  Administered 2022-01-14 – 2022-02-11 (×2): 30 mg via SUBCUTANEOUS

## 2022-01-21 DIAGNOSIS — M160 Bilateral primary osteoarthritis of hip: Secondary | ICD-10-CM | POA: Insufficient documentation

## 2022-01-21 DIAGNOSIS — M170 Bilateral primary osteoarthritis of knee: Secondary | ICD-10-CM | POA: Insufficient documentation

## 2022-01-21 HISTORY — DX: Bilateral primary osteoarthritis of knee: M17.0

## 2022-01-21 HISTORY — DX: Bilateral primary osteoarthritis of hip: M16.0

## 2022-02-11 ENCOUNTER — Ambulatory Visit: Payer: BC Managed Care – PPO

## 2022-02-11 ENCOUNTER — Ambulatory Visit (INDEPENDENT_AMBULATORY_CARE_PROVIDER_SITE_OTHER): Payer: BC Managed Care – PPO

## 2022-02-11 DIAGNOSIS — J4551 Severe persistent asthma with (acute) exacerbation: Secondary | ICD-10-CM

## 2022-02-11 DIAGNOSIS — J4550 Severe persistent asthma, uncomplicated: Secondary | ICD-10-CM | POA: Diagnosis not present

## 2022-02-14 ENCOUNTER — Ambulatory Visit: Payer: BC Managed Care – PPO

## 2022-03-01 ENCOUNTER — Ambulatory Visit (INDEPENDENT_AMBULATORY_CARE_PROVIDER_SITE_OTHER): Payer: BC Managed Care – PPO | Admitting: Allergy and Immunology

## 2022-03-01 VITALS — BP 140/92 | HR 81 | Temp 97.7°F | Resp 16 | Ht 66.0 in | Wt 175.6 lb

## 2022-03-01 DIAGNOSIS — J4550 Severe persistent asthma, uncomplicated: Secondary | ICD-10-CM

## 2022-03-01 DIAGNOSIS — F17210 Nicotine dependence, cigarettes, uncomplicated: Secondary | ICD-10-CM

## 2022-03-01 DIAGNOSIS — K219 Gastro-esophageal reflux disease without esophagitis: Secondary | ICD-10-CM | POA: Diagnosis not present

## 2022-03-01 DIAGNOSIS — J301 Allergic rhinitis due to pollen: Secondary | ICD-10-CM

## 2022-03-01 DIAGNOSIS — J3089 Other allergic rhinitis: Secondary | ICD-10-CM

## 2022-03-01 DIAGNOSIS — L2089 Other atopic dermatitis: Secondary | ICD-10-CM

## 2022-03-01 MED ORDER — OMEPRAZOLE 40 MG PO CPDR: 40.0000 mg | DELAYED_RELEASE_CAPSULE | Freq: Two times a day (BID) | ORAL | 5 refills | Status: DC

## 2022-03-01 MED ORDER — LORATADINE 10 MG PO TABS: 10.0000 mg | ORAL_TABLET | Freq: Every day | ORAL | 5 refills | Status: DC | PRN

## 2022-03-01 MED ORDER — FLUTICASONE PROPIONATE 50 MCG/ACT NA SUSP: 1.0000 | Freq: Two times a day (BID) | NASAL | 5 refills | Status: DC

## 2022-03-01 MED ORDER — BREZTRI AEROSPHERE 160-9-4.8 MCG/ACT IN AERO: 2.0000 | INHALATION_SPRAY | Freq: Two times a day (BID) | RESPIRATORY_TRACT | 5 refills | Status: DC

## 2022-03-01 MED ORDER — ALBUTEROL SULFATE (5 MG/ML) 0.5% IN NEBU: 2.5000 mg | INHALATION_SOLUTION | Freq: Four times a day (QID) | RESPIRATORY_TRACT | 3 refills | Status: DC | PRN

## 2022-03-01 MED ORDER — MOMETASONE FUROATE 0.1 % EX OINT: TOPICAL_OINTMENT | Freq: Every day | CUTANEOUS | 0 refills | Status: DC

## 2022-03-01 MED ORDER — ALBUTEROL SULFATE HFA 108 (90 BASE) MCG/ACT IN AERS: 1.0000 | INHALATION_SPRAY | Freq: Four times a day (QID) | RESPIRATORY_TRACT | 1 refills | Status: DC | PRN

## 2022-03-01 NOTE — Patient Instructions (Addendum)
  1.  Allergen avoidance measures -dust, mold, pollens  2.  Use nicotine substitutes to replace tobacco smoke exposure  3.  Treat and prevent inflammation:  A. Breztri - 2 inhalations 3-7 times per week B. Flonase / fluticasone - 1 spray each nostril 3-7 times per week C. Fasenra / benralizumab injections D. Shower followed by mometasone 0.1% ointment daily until resolved  4. Treat and prevent reflux:  A. Omeprazole 40 mg - 1 tablet 1-2 times per day  5. If needed:   A. Albuterol HFA - 2 inhalations every 4-6 hours B. Loratadine 10 mg - 1 tablet 1-2 times per day  6. Return to clinic in 6 months or earlier if problem

## 2022-03-01 NOTE — Progress Notes (Unsigned)
[place] [facility] [place] [facility] [place] [facility] [place] [facility] [place]   Follow-up Note  Referring Provider: No ref. provider found Primary Provider: Pcp, No Date of Office Visit: 03/01/2022  Subjective:   Renee Cross (DOB: Dec 04, 1975) is a 46 y.o. female who [facility] in re-evaluation of the following:  HPI: Renee Cross returns to this clinic in evaluation of severe asthma, allergic rhinitis, history nasal polyposis, reflux, and intermittent tobacco smoking.  I last saw her during her initial evaluation in 19 October 2021.  Since her last visit she was started on benralizumab injections and this has resulted in dramatic improvement regarding control of her respiratory tract issues and she has been able to taper down her Renee Cross use to just 3 times per week and has also taper down her Flonase to just a few times per week and she has had no need to use a short acting bronchodilator and she feels very good regarding both her upper and lower airway.  Her reflux is under very good control at this point in time on her current plan.  She continues to smoke approximately 10 cigarettes/day.  She is developing some eczema involving her posterior forearm and her upper back.  Allergies as of 03/01/2022   No Known Allergies      Medication List    albuterol (5 MG/ML) 0.5% nebulizer solution Commonly known as: PROVENTIL Take 0.5 mLs (2.5 mg total) by nebulization every 6 (six) hours as needed for wheezing or shortness of breath.   albuterol 108 (90 Base) MCG/ACT inhaler Commonly known as: VENTOLIN HFA Inhale 1-2 puffs into the lungs every 6 (six) hours as needed for wheezing.   benzonatate 100 MG capsule Commonly known as: TESSALON Take 1 capsule (100 mg total) by mouth 3 (three) times daily as needed for cough.   Breztri Aerosphere 160-9-4.8 MCG/ACT Aero Generic drug: Budeson-Glycopyrrol-Formoterol Inhale 2 puffs into the lungs in the morning and at  bedtime.   cetirizine 10 MG tablet Commonly known as: ZYRTEC Take 1 tablet (10 mg total) by mouth daily. 1 po q day prn allergies   fluticasone 50 MCG/ACT nasal spray Commonly known as: FLONASE Place 1 spray into both nostrils 2 (two) times daily.   loratadine 10 MG tablet Commonly known as: CLARITIN Take 1 tablet (10 mg total) by mouth 2 (two) times daily as needed for allergies (Can take an extra dose during flare ups.).   Olopatadine HCl 0.2 % Soln Commonly known as: Pataday Place 1 drop into both eyes daily.   omeprazole 40 MG capsule Commonly known as: PRILOSEC Take 1 capsule (40 mg total) by mouth 2 (two) times daily.   traMADol 50 MG tablet Commonly known as: ULTRAM Take 50 mg by mouth.     Past Medical History:  Diagnosis Date   Anemia    Asthma    GERD (gastroesophageal reflux disease)    Hypertension    Sickle cell trait (HCC)     Past Surgical History:  Procedure Laterality Date   HERNIA REPAIR     TONSILLECTOMY     TUBAL LIGATION      Review of systems negative except as noted in HPI / PMHx or noted below:  Review of Systems  Constitutional: Negative.   HENT: Negative.    Eyes: Negative.   Respiratory: Negative.    Cardiovascular: Negative.   Gastrointestinal: Negative.   Genitourinary: Negative.   Musculoskeletal: Negative.   Skin: Negative.   Neurological: Negative.  Endo/Heme/Allergies: Negative.   Psychiatric/Behavioral: Negative.      Objective:   Vitals:   03/01/22 1612  BP: (!) 140/92  Pulse: 81  Resp: 16  Temp: 97.7 F (36.5 C)  SpO2: 100%   Height: 5\' 6"  (167.6 cm)  Weight: 175 lb 9.6 oz (79.7 kg)   Physical Exam Constitutional:      Appearance: She is not diaphoretic.  HENT:     Head: Normocephalic.     Right Ear: Tympanic membrane, ear canal and external ear normal.     Left Ear: Tympanic membrane, ear canal and external ear normal.     Nose: Nose normal. No mucosal edema or rhinorrhea.     Mouth/Throat:      Pharynx: Uvula midline. No oropharyngeal exudate.  Eyes:     Conjunctiva/sclera: Conjunctivae normal.  Neck:     Thyroid: No thyromegaly.     Trachea: Trachea normal. No tracheal tenderness or tracheal deviation.  Cardiovascular:     Rate and Rhythm: Normal rate and regular rhythm.     Heart sounds: Normal heart sounds, S1 normal and S2 normal. No murmur heard. Pulmonary:     Effort: No respiratory distress.     Breath sounds: Normal breath sounds. No stridor. No wheezing or rales.  Lymphadenopathy:     Head:     Right side of head: No tonsillar adenopathy.     Left side of head: No tonsillar adenopathy.     Cervical: No cervical adenopathy.  Skin:    Findings: Rash (Posterior forearm erythema and excoriation bilaterally.) present. No erythema.     Nails: There is no clubbing.  Neurological:     Mental Status: She is alert.    Diagnostics:    Spirometry was performed and demonstrated an FEV1 of 1.61 at 61 % of predicted.  Results of blood tests obtained 19 October 2021 identifies WBC 6.2, absolute eosinophil 200, absolute lymphocyte 1500, hemoglobin 10.4, platelet 246, IgE 502 KU/L, no antigen specific IgE antibodies on a area two aero allergen profile  Assessment and Plan:   1. Asthma, severe persistent, well-controlled   2. Perennial allergic rhinitis   3. Other atopic dermatitis   4. Gastroesophageal reflux disease, unspecified whether esophagitis present   5. Light tobacco smoker <10 cigarettes per day     1.  Allergen avoidance measures -dust, mold, pollens  2.  Use nicotine substitutes to replace tobacco smoke exposure  3.  Treat and prevent inflammation:  A. Breztri - 2 inhalations 3-7 times per week B. Flonase / fluticasone - 1 spray each nostril 3-7 times per week C. Fasenra / benralizumab injections D. Shower followed by mometasone 0.1% ointment daily until resolved  4. Treat and prevent reflux:  A. Omeprazole 40 mg - 1 tablet 1-2 times per day  5. If  needed:   A. Albuterol HFA - 2 inhalations every 4-6 hours B. Loratadine 10 mg - 1 tablet 1-2 times per day  6. 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  Of course, she would do much better if she stop smoking and I had a talk with her today about that issue once again.  She appears to have developed some  problems with eczema involving her forearms and her upper back and she can use topical mometasone to address this issue.  Assuming she does well with this plan I will see her back in this clinic in 6 months or earlier if there is a problem.  Laurette Schimke, MD Allergy / Immunology [facility]

## 2022-03-02 ENCOUNTER — Encounter: Payer: Self-pay | Admitting: Allergy and Immunology

## 2022-03-03 ENCOUNTER — Other Ambulatory Visit: Payer: Self-pay

## 2022-03-03 MED ORDER — ALBUTEROL SULFATE (2.5 MG/3ML) 0.083% IN NEBU: 2.5000 mg | INHALATION_SOLUTION | RESPIRATORY_TRACT | 1 refills | Status: DC | PRN

## 2022-03-11 ENCOUNTER — Ambulatory Visit: Payer: BC Managed Care – PPO

## 2022-03-31 DIAGNOSIS — D500 Iron deficiency anemia secondary to blood loss (chronic): Secondary | ICD-10-CM

## 2022-03-31 HISTORY — DX: Iron deficiency anemia secondary to blood loss (chronic): D50.0

## 2022-08-30 ENCOUNTER — Ambulatory Visit: Payer: Self-pay | Admitting: Allergy and Immunology

## 2022-11-08 ENCOUNTER — Ambulatory Visit: Payer: Self-pay | Admitting: Allergy and Immunology

## 2022-11-16 ENCOUNTER — Other Ambulatory Visit: Payer: Self-pay

## 2022-11-16 ENCOUNTER — Emergency Department (HOSPITAL_BASED_OUTPATIENT_CLINIC_OR_DEPARTMENT_OTHER)
Admission: EM | Admit: 2022-11-16 | Discharge: 2022-11-16 | Disposition: A | Discharge status: Home / Self Care | Payer: BC Managed Care – PPO | Attending: Emergency Medicine | Admitting: Emergency Medicine

## 2022-11-16 ENCOUNTER — Encounter (HOSPITAL_BASED_OUTPATIENT_CLINIC_OR_DEPARTMENT_OTHER): Payer: Self-pay | Admitting: Emergency Medicine

## 2022-11-16 DIAGNOSIS — Z20822 Contact with and (suspected) exposure to covid-19: Secondary | ICD-10-CM | POA: Insufficient documentation

## 2022-11-16 DIAGNOSIS — J45909 Unspecified asthma, uncomplicated: Secondary | ICD-10-CM | POA: Insufficient documentation

## 2022-11-16 DIAGNOSIS — Z7951 Long term (current) use of inhaled steroids: Secondary | ICD-10-CM | POA: Insufficient documentation

## 2022-11-16 DIAGNOSIS — J069 Acute upper respiratory infection, unspecified: Secondary | ICD-10-CM | POA: Insufficient documentation

## 2022-11-16 LAB — RESP PANEL BY RT-PCR (RSV, FLU A&B, COVID)  RVPGX2
Influenza A by PCR: NEGATIVE
Influenza B by PCR: NEGATIVE
Resp Syncytial Virus by PCR: NEGATIVE
SARS Coronavirus 2 by RT PCR: NEGATIVE

## 2022-11-16 MED ORDER — AZITHROMYCIN 250 MG PO TABS: 250.0000 mg | ORAL_TABLET | Freq: Every day | ORAL | 0 refills | Status: DC

## 2022-11-16 NOTE — Discharge Instructions (Signed)
Your work-up in the ER today was reassuring for acute findings. You were swabbed for COVID, flu, and RSV which were negative. However, your symptoms are still likely related to an upper respiratory infection. As these are almost always viral in nature, no antibiotics are indicated. I recommend that you get plenty of rest and focus on symptomatic relief which includes Cepacol throat lozenges for sore throat, Mucinex D (orange box) which you can get from behind the counter at your local pharmacy for congestion, and tylenol/ibuprofen as needed for fevers and bodyaches. You may also take Flonase and Afrin spray for sinus congestion. I have also given you a prescription for Azithromycin which is an antibiotic to cover for any bacterial infection per your request. I also recommend:  Increased fluid intake. Sports drinks offer valuable electrolytes, sugars, and fluids.  Breathing heated mist or steam (vaporizer or shower).  Eating chicken soup or other clear broths, and maintaining good nutrition.   Increasing usage of your inhaler if you have asthma.  Return to work when your temperature has returned to normal.  Gargle warm salt water and spit it out for sore throat. Take benadryl or Zyrtec to decrease sinus secretions.  Follow Up: Follow up with your primary care doctor in 5-7 days for recheck of ongoing symptoms.  Return to emergency department for emergent changing or worsening of symptoms.

## 2022-11-16 NOTE — ED Provider Notes (Signed)
Renee Cross   CSN: [id number] Arrival date & time: 11/16/22  1827     History  Chief Complaint  Patient presents with   Nasal Congestion    Renee Cross is a 47 y.o. female.  Patient with history of asthma and GERD presents today with complaints of nasal congestion. She states that same began initially 2 days ago and has been persistent since. She has been attempting to manage symptoms with OTC medications with some relief. She states that normally when she has these symptoms she is given an antibiotic and requests same. She states that she is unable to breath out of her nose and is having sinus pressure and post nasal drip causing a sore throat. She denies any cough, shortness of breath, or chest pain. No headaches, vision changes. numbness/tingling, or nausea/vomiting.  The history is provided by the patient. No language interpreter was used.       Home Medications Prior to Admission medications   Medication Sig Start Date End Date Taking? Authorizing Provider  albuterol (PROVENTIL) (2.5 MG/3ML) 0.083% nebulizer solution Take 3 mLs (2.5 mg total) by nebulization every 4 (four) hours as needed for wheezing or shortness of breath. 03/03/22   Kozlow, Donnamarie Poag, MD  albuterol (VENTOLIN HFA) 108 (90 Base) MCG/ACT inhaler Inhale 1-2 puffs into the lungs every 6 (six) hours as needed for wheezing. 03/01/22   Kozlow, Donnamarie Poag, MD  benzonatate (TESSALON) 100 MG capsule Take 1 capsule (100 mg total) by mouth 3 (three) times daily as needed for cough. 12/01/21   Quintella Reichert, MD  Budeson-Glycopyrrol-Formoterol (BREZTRI AEROSPHERE) 160-9-4.8 MCG/ACT AERO Inhale 2 puffs into the lungs in the morning and at bedtime. 03/01/22   Kozlow, Donnamarie Poag, MD  cetirizine (ZYRTEC) 10 MG tablet Take 1 tablet (10 mg total) by mouth daily. 1 po q day prn allergies 06/15/15   Tanna Furry, MD  fluticasone [facility]) 50 MCG/ACT nasal spray Place 1 spray into both  nostrils 2 (two) times daily. 03/01/22   Kozlow, Donnamarie Poag, MD  loratadine (CLARITIN) 10 MG tablet Take 1 tablet (10 mg total) by mouth daily as needed for allergies (Can take an extra dose during flare ups.). 03/01/22   Kozlow, Donnamarie Poag, MD  mometasone (ELOCON) 0.1 % ointment Apply topically daily. 03/01/22   Kozlow, Donnamarie Poag, MD  Olopatadine HCl (PATADAY) 0.2 % SOLN Place 1 drop into both eyes daily. 11/02/21   Kozlow, Donnamarie Poag, MD  omeprazole (PRILOSEC) 40 MG capsule Take 1 capsule (40 mg total) by mouth 2 (two) times daily. 03/01/22   Kozlow, Donnamarie Poag, MD  pantoprazole (PROTONIX) 40 MG tablet 1 tab(s) 05/06/21   [provider]  traMADol (ULTRAM) 50 MG tablet Take 50 mg by mouth.    [provider]      Allergies    Patient has no known allergies.    Review of Systems   Review of Systems  HENT:  Positive for congestion.   All other systems reviewed and are negative.   Physical Exam Updated Vital Signs BP (!) 143/93 (BP Location: Right Arm)   Pulse 87   Temp 98 F (36.7 C)   Resp 20   Ht 5' 6"$  (1.676 m)   Wt 81.6 kg   LMP 11/09/2022   SpO2 100%   BMI 29.02 kg/m  Physical Exam Vitals and nursing Cross reviewed.  Constitutional:      General: She is not in acute distress.  Appearance: Normal appearance. She is normal weight. She is not ill-appearing, toxic-appearing or diaphoretic.  HENT:     Head: Normocephalic and atraumatic.     Comments: Maxillary sinus tenderness to palpation. No frontal sinus tenderness    Nose:     Comments: Bilateral nares with congestion and nasal drainage    Mouth/Throat:     Mouth: Mucous membranes are moist.     Pharynx: Uvula midline.     Tonsils: No tonsillar exudate or tonsillar abscesses.  Cardiovascular:     Rate and Rhythm: Normal rate and regular rhythm.     Heart sounds: Normal heart sounds.  Pulmonary:     Effort: Pulmonary effort is normal. No respiratory distress.     Breath sounds: Normal breath sounds.  Abdominal:      General: Abdomen is flat.     Palpations: Abdomen is soft.  Musculoskeletal:        General: Normal range of motion.     Cervical back: Normal range of motion and neck supple. No tenderness.  Lymphadenopathy:     Cervical: No cervical adenopathy.  Skin:    General: Skin is warm and dry.  Neurological:     General: No focal deficit present.     Mental Status: She is alert.  Psychiatric:        Mood and Affect: Mood normal.        Behavior: Behavior normal.     ED Results / Procedures / Treatments   Labs (all labs ordered are listed, but only abnormal results are displayed) Labs Reviewed  RESP PANEL BY RT-PCR (RSV, FLU A&B, COVID)  [id number]    EKG None  Radiology No results found.  Procedures Procedures    Medications Ordered in ED Medications - No data to display  ED Course/ Medical Decision Making/ A&P                             Medical Decision Making  Patient presents today with complaints of nasal congestion x 2 days.  She is afebrile, nontoxic-appearing, and in no acute distress with reassuring vital signs.  Physical exam reveals lung sounds clear to auscultation in all fields.  She does have some maxillary sinus tenderness with congestion and nasal drainage in bilateral nares.  She is negative for COVID, flu, and RSV. Differential includes bacterial sinusitis, viral sinusitis, and URI. Patients symptoms are likely viral in nature, however patient repeatedly demands antibiotics and states that antibiotics are always required to treat her sinusitis. I am able to see where she received a Z pack several years ago. Will give for same as well as recommendations for Afrin and flonase and Zyrtec. Patient understanding and amenable with plan. She appears stable for discharge. Educated on red flag symptoms that would prompt immediate return. Patient discharged in stable condition.   Final Clinical Impression(s) / ED Diagnoses Final diagnoses:  Viral upper respiratory tract  infection    Rx / DC Orders ED Discharge Orders          Ordered    azithromycin (ZITHROMAX Z-PAK) 250 MG tablet  Daily        11/16/22 2016          An After Visit Summary was printed and given to the patient.     Nestor Lewandowsky 11/16/22 2031    Isla Pence, MD 11/16/22 2042

## 2022-11-16 NOTE — ED Triage Notes (Signed)
Pt c/o nasal congestion for several days

## 2022-12-13 ENCOUNTER — Encounter: Payer: Self-pay | Admitting: Allergy and Immunology

## 2022-12-13 ENCOUNTER — Ambulatory Visit (INDEPENDENT_AMBULATORY_CARE_PROVIDER_SITE_OTHER): Payer: 59 | Admitting: Allergy and Immunology

## 2022-12-13 VITALS — BP 170/102 | HR 85 | Temp 98.2°F | Resp 16 | Ht 66.0 in | Wt 183.7 lb

## 2022-12-13 DIAGNOSIS — J301 Allergic rhinitis due to pollen: Secondary | ICD-10-CM

## 2022-12-13 DIAGNOSIS — J4550 Severe persistent asthma, uncomplicated: Secondary | ICD-10-CM | POA: Diagnosis not present

## 2022-12-13 DIAGNOSIS — J3089 Other allergic rhinitis: Secondary | ICD-10-CM | POA: Diagnosis not present

## 2022-12-13 DIAGNOSIS — L2089 Other atopic dermatitis: Secondary | ICD-10-CM

## 2022-12-13 DIAGNOSIS — K219 Gastro-esophageal reflux disease without esophagitis: Secondary | ICD-10-CM

## 2022-12-13 DIAGNOSIS — H6993 Unspecified Eustachian tube disorder, bilateral: Secondary | ICD-10-CM

## 2022-12-13 MED ORDER — MOMETASONE FUROATE 0.1 % EX OINT: TOPICAL_OINTMENT | Freq: Every day | CUTANEOUS | 1 refills | Status: DC

## 2022-12-13 MED ORDER — LORATADINE 10 MG PO TABS: 10.0000 mg | ORAL_TABLET | Freq: Every day | ORAL | 5 refills | Status: DC | PRN

## 2022-12-13 MED ORDER — FLUTICASONE PROPIONATE 50 MCG/ACT NA SUSP: 1.0000 | Freq: Two times a day (BID) | NASAL | 5 refills | Status: DC

## 2022-12-13 MED ORDER — METHYLPREDNISOLONE ACETATE 80 MG/ML IJ SUSP
80.0000 mg | Freq: Once | INTRAMUSCULAR | Status: AC
  Administered 2022-12-13: 80 mg via INTRAMUSCULAR

## 2022-12-13 MED ORDER — TRELEGY ELLIPTA 200-62.5-25 MCG/ACT IN AEPB: 1.0000 | INHALATION_SPRAY | Freq: Every morning | RESPIRATORY_TRACT | 5 refills | Status: DC

## 2022-12-13 MED ORDER — OMEPRAZOLE 40 MG PO CPDR: 40.0000 mg | DELAYED_RELEASE_CAPSULE | Freq: Every morning | ORAL | 5 refills | Status: DC

## 2022-12-13 NOTE — Patient Instructions (Addendum)
  1.  Allergen avoidance measures -dust, mold, pollens  2.  Treat and prevent inflammation:  A. Trelegy 200 - 1 inhalation 1 time per day (empty lungs) B. Flonase - 2 spray each nostril 1 time per day C. Shower followed by mometasone 0.1% ointment daily until resolved D. Depomedrol 80 IM delivered in clinc=ic today  3. Treat and prevent reflux:  A. Omeprazole 40 mg - 1 tablet 1 time per day  4. If needed:   A. Albuterol HFA - 2 inhalations every 4-6 hours B. Loratadine 10 mg - 1 tablet 1-2 times per day  5. Return to clinic in 4 weeks or earlier if problem

## 2022-12-13 NOTE — Progress Notes (Unsigned)
[facility] [place] [facility] [place]   Follow-up Note  Referring Provider: No ref. provider found Primary Provider: Pcp, No Date of Office Visit: 12/13/2022  Subjective:   Renee Cross (DOB: 1975-10-09) is a 47 y.o. female who returns to the [facility] [place] on 12/13/2022 in re-evaluation of the following:  HPI: Renee Cross returns to this clinic in evaluation of asthma, allergic rhinitis, eczema, history of nasal polyposis, reflux, and a history of intermittent tobacco smoking.  I last saw her in this clinic 01 Mar 2022.  She unfortunately lost her insurance and stopped using all of her medications and surprisingly did pretty well through the summer in the fall 2023.  But unfortunately she ended up in the emergency room on 16 November 2022 with a viral upper respiratory tract infection treated with azithromycin.  Currently she has head congestion and nasal congestion and head fullness and headache and a little bit of a cough and ear popping and ear fullness and some intermittent anosmia without any ugly nasal discharge and without any ugly sputum production and without any chest pain.  Her reflux is doing pretty well at this point in time while using omeprazole.  She stopped smoking January 2024.  She has been developing eczematous lesions on her stomach and arm and back that are itchy.  Allergies as of 12/13/2022   No Known Allergies      Medication List    albuterol 108 (90 Base) MCG/ACT inhaler Commonly known as: VENTOLIN HFA Inhale 1-2 puffs into the lungs every 6 (six) hours as needed for wheezing.   albuterol (2.5 MG/3ML) 0.083% nebulizer solution Commonly known as: PROVENTIL Take 3 mLs (2.5 mg total) by nebulization every 4 (four) hours as needed for wheezing or shortness of breath.   Breztri Aerosphere 160-9-4.8 MCG/ACT Aero Generic drug: Budeson-Glycopyrrol-Formoterol Inhale 2 puffs into the lungs in the morning and at bedtime.    cetirizine 10 MG tablet Commonly known as: ZYRTEC Take 1 tablet (10 mg total) by mouth daily. 1 po q day prn allergies   fluticasone 50 MCG/ACT nasal spray Commonly known as: FLONASE Place 1 spray into both nostrils 2 (two) times daily.   loratadine 10 MG tablet Commonly known as: Claritin Take 1 tablet (10 mg total) by mouth daily as needed for allergies (Can take an extra dose during flare ups).   mometasone 0.1 % ointment Commonly known as: ELOCON Apply topically daily.   Olopatadine HCl 0.2 % Soln Commonly known as: Pataday Place 1 drop into both eyes daily.   omeprazole 40 MG capsule Commonly known as: PRILOSEC Take 1 capsule (40 mg total) by mouth in the morning. What changed: when to take this Changed by: Shakerra Red Kevan Rosebush, MD   pantoprazole 40 MG tablet Commonly known as: PROTONIX 1 tab(s)   Trelegy Ellipta 200-62.5-25 MCG/ACT Aepb Generic drug: Fluticasone-Umeclidin-Vilant Inhale 1 puff into the lungs in the morning. Started by: Jiles Prows, MD    Past Medical History:  Diagnosis Date   Anemia    Asthma    GERD (gastroesophageal reflux disease)    Hypertension    Sickle cell trait ([place])     Past Surgical History:  Procedure Laterality Date   HERNIA REPAIR     TONSILLECTOMY     TUBAL LIGATION      Review of systems negative except as noted in HPI / PMHx or noted below:  Review of Systems  Constitutional: Negative.   HENT: Negative.  Eyes: Negative.   Respiratory: Negative.    Cardiovascular: Negative.   Gastrointestinal: Negative.   Genitourinary: Negative.   Musculoskeletal: Negative.   Skin: Negative.   Neurological: Negative.   Endo/Heme/Allergies: Negative.   Psychiatric/Behavioral: Negative.       Objective:   Vitals:   12/13/22 1143 12/13/22 1158  BP: (!) 170/100 (!) 170/102  Pulse:    Resp:    Temp:    SpO2:     Height: '5\' 6"'$  (167.6 cm)  Weight: 183 lb 11.2 oz (83.3 kg)   Physical Exam Constitutional:       Appearance: She is not diaphoretic.  HENT:     Head: Normocephalic.     Right Ear: Ear canal and external ear normal. A middle ear effusion is present.     Left Ear: Ear canal and external ear normal. A middle ear effusion is present.     Nose: Mucosal edema present. No rhinorrhea.     Mouth/Throat:     Pharynx: Uvula midline. No oropharyngeal exudate.  Eyes:     Conjunctiva/sclera: Conjunctivae normal.  Neck:     Thyroid: No thyromegaly.     Trachea: Trachea normal. No tracheal tenderness or tracheal deviation.  Cardiovascular:     Rate and Rhythm: Normal rate and regular rhythm.     Heart sounds: Normal heart sounds, S1 normal and S2 normal. No murmur heard. Pulmonary:     Effort: No respiratory distress.     Breath sounds: Normal breath sounds. No stridor. No wheezing or rales.  Lymphadenopathy:     Head:     Right side of head: No tonsillar adenopathy.     Left side of head: No tonsillar adenopathy.     Cervical: No cervical adenopathy.  Skin:    Findings: Rash (Hyperpigmented lichenified erythematous patches trunk) present. No erythema.     Nails: There is no clubbing.  Neurological:     Mental Status: She is alert.     Diagnostics:    Spirometry was performed and demonstrated an FEV1 of 1.70 at 65 % of predicted.  Assessment and Plan:   1. Not well controlled severe persistent asthma   2. Perennial allergic rhinitis   3. Seasonal allergic rhinitis due to pollen   4. Dysfunction of both eustachian tubes   5. Other atopic dermatitis   6. Gastroesophageal reflux disease, unspecified whether esophagitis present    1.  Allergen avoidance measures -dust, mold, pollens  2.  Treat and prevent inflammation:  A. Trelegy 200 - 1 inhalation 1 time per day (empty lungs) B. Flonase - 2 spray each nostril 1 time per day C. Shower followed by mometasone 0.1% ointment daily until resolved D. Depomedrol 80 IM delivered in clinc=ic today  3. Treat and prevent reflux:  A.  Omeprazole 40 mg - 1 tablet 1 time per day  4. If needed:   A. Albuterol HFA - 2 inhalations every 4-6 hours B. Loratadine 10 mg - 1 tablet 1-2 times per day  5. Return to clinic in 4 weeks or earlier if problem  [name] has an inflamed airway including her upper airways and her lower airways and her eustachian tubes and and also has inflamed skin and were going to treat her with a combination of anti-inflammatory agents as noted above while she also continues to address her reflux issue with omeprazole.  Will see her back in this clinic in 4 weeks to consider further evaluation and treatment based upon her response.  Allena Katz,  MD Allergy / Immunology [place]

## 2022-12-14 ENCOUNTER — Telehealth: Payer: Self-pay

## 2022-12-14 ENCOUNTER — Encounter: Payer: Self-pay | Admitting: Allergy and Immunology

## 2022-12-14 DIAGNOSIS — M160 Bilateral primary osteoarthritis of hip: Secondary | ICD-10-CM | POA: Diagnosis not present

## 2022-12-14 MED ORDER — TRELEGY ELLIPTA 200-62.5-25 MCG/ACT IN AEPB: INHALATION_SPRAY | RESPIRATORY_TRACT | 1 refills | Status: DC

## 2022-12-14 MED ORDER — LORATADINE 10 MG PO TABS: ORAL_TABLET | ORAL | 5 refills | Status: DC

## 2022-12-14 MED ORDER — ALBUTEROL SULFATE HFA 108 (90 BASE) MCG/ACT IN AERS
INHALATION_SPRAY | RESPIRATORY_TRACT | 1 refills | Status: DC
Start: 2022-12-14 — End: 2023-02-21

## 2022-12-14 MED ORDER — FLUTICASONE PROPIONATE 50 MCG/ACT NA SUSP: NASAL | 5 refills | Status: DC

## 2022-12-14 MED ORDER — MOMETASONE FUROATE 0.1 % EX OINT: TOPICAL_OINTMENT | Freq: Every day | CUTANEOUS | 3 refills | Status: DC

## 2022-12-14 MED ORDER — OMEPRAZOLE 40 MG PO CPDR: 40.0000 mg | DELAYED_RELEASE_CAPSULE | Freq: Every morning | ORAL | 1 refills | Status: DC

## 2022-12-14 NOTE — Telephone Encounter (Signed)
Resent all medications that were sent to [facility] 24 hour [address] [facility] to [employer]. Told patient to call office if she has trouble getting her medications. I called the 24 hr [facility] to cancel all medications that were sent there yesterday.

## 2022-12-22 ENCOUNTER — Ambulatory Visit (INDEPENDENT_AMBULATORY_CARE_PROVIDER_SITE_OTHER): Payer: 59 | Admitting: Family Medicine

## 2022-12-22 ENCOUNTER — Encounter: Payer: Self-pay | Admitting: Family Medicine

## 2022-12-22 VITALS — BP 130/78 | HR 79 | Temp 97.5°F | Ht 66.75 in | Wt 179.4 lb

## 2022-12-22 DIAGNOSIS — Z0271 Encounter for disability determination: Secondary | ICD-10-CM | POA: Insufficient documentation

## 2022-12-22 DIAGNOSIS — J3089 Other allergic rhinitis: Secondary | ICD-10-CM

## 2022-12-22 DIAGNOSIS — Z01818 Encounter for other preprocedural examination: Secondary | ICD-10-CM | POA: Insufficient documentation

## 2022-12-22 DIAGNOSIS — J4551 Severe persistent asthma with (acute) exacerbation: Secondary | ICD-10-CM | POA: Insufficient documentation

## 2022-12-22 DIAGNOSIS — M069 Rheumatoid arthritis, unspecified: Secondary | ICD-10-CM | POA: Insufficient documentation

## 2022-12-22 DIAGNOSIS — I10 Essential (primary) hypertension: Secondary | ICD-10-CM

## 2022-12-22 DIAGNOSIS — N939 Abnormal uterine and vaginal bleeding, unspecified: Secondary | ICD-10-CM | POA: Insufficient documentation

## 2022-12-22 DIAGNOSIS — J302 Other seasonal allergic rhinitis: Secondary | ICD-10-CM | POA: Insufficient documentation

## 2022-12-22 DIAGNOSIS — D649 Anemia, unspecified: Secondary | ICD-10-CM | POA: Insufficient documentation

## 2022-12-22 DIAGNOSIS — M1610 Unilateral primary osteoarthritis, unspecified hip: Secondary | ICD-10-CM

## 2022-12-22 DIAGNOSIS — K219 Gastro-esophageal reflux disease without esophagitis: Secondary | ICD-10-CM | POA: Insufficient documentation

## 2022-12-22 DIAGNOSIS — H53002 Unspecified amblyopia, left eye: Secondary | ICD-10-CM

## 2022-12-22 DIAGNOSIS — J4550 Severe persistent asthma, uncomplicated: Secondary | ICD-10-CM | POA: Diagnosis not present

## 2022-12-22 DIAGNOSIS — Z91148 Patient's other noncompliance with medication regimen for other reason: Secondary | ICD-10-CM | POA: Insufficient documentation

## 2022-12-22 HISTORY — DX: Unilateral primary osteoarthritis, unspecified hip: M16.10

## 2022-12-22 HISTORY — DX: Rheumatoid arthritis, unspecified: M06.9

## 2022-12-22 HISTORY — DX: Unspecified amblyopia, left eye: H53.002

## 2022-12-22 HISTORY — DX: Abnormal uterine and vaginal bleeding, unspecified: N93.9

## 2022-12-22 HISTORY — DX: Encounter for disability determination: Z02.71

## 2022-12-22 HISTORY — DX: Patient's other noncompliance with medication regimen for other reason: Z91.148

## 2022-12-22 HISTORY — DX: Severe persistent asthma with (acute) exacerbation: J45.51

## 2022-12-22 HISTORY — DX: Other allergic rhinitis: J30.89

## 2022-12-22 HISTORY — DX: Essential (primary) hypertension: I10

## 2022-12-22 HISTORY — DX: Other seasonal allergic rhinitis: J30.2

## 2022-12-22 HISTORY — DX: Gastro-esophageal reflux disease without esophagitis: K21.9

## 2022-12-22 NOTE — Assessment & Plan Note (Signed)
Plan to monitor blood pressure regularly.

## 2022-12-22 NOTE — Patient Instructions (Signed)
We are obtaining labs as discussed today.  Please come back fasting for this.  Once we have these we can assess your surgical risk.  Please do not take over-the-counter pain medication such as Tylenol, Profen, naproxen or others more than prescribed on the bottle as this may lead to organ dysfunction as discussed.

## 2022-12-22 NOTE — Assessment & Plan Note (Signed)
Plan to confirm hemoglobin stability and iron levels preoperatively. Ongoing supplementation and monitoring by a hematologist.

## 2022-12-22 NOTE — Progress Notes (Signed)
Assessment/Plan:   Problem List Items Addressed This Visit       Cardiovascular and Mediastinum   Primary hypertension    Plan to monitor blood pressure regularly.      Relevant Orders   TSH   Lipid panel   Hemoglobin A1c   Microalbumin / creatinine urine ratio   Urinalysis, Routine w reflex microscopic   Comprehensive metabolic panel     Respiratory   Severe persistent asthma    Plan to continue current regimen. Suggested to maintain follow-up with Dr. Carmelina Peal for managing and administering shots for asthma and allergic conditions.      Perennial allergic rhinitis with seasonal variation     Digestive   Gastroesophageal reflux disease    Plan to continue omeprazole management. Monitor for any recurrent symptoms.        Musculoskeletal and Integument   Rheumatoid arthritis ([place])    Plan to consider a referral to a different rheumatologist based on patient's dissatisfaction with previous care after preoperative clearance is obtained.      Relevant Orders   Vitamin D 1,25 dihydroxy   Sedimentation rate   C-reactive protein   Hip arthritis    Plan to obtain clearance for upcoming bilateral hip surgery. Advised on safe use of over-the-counter analgesics. Encouraged follow-up discussion regarding pain management with orthopedic surgeon.      Relevant Orders   Vitamin D 1,25 dihydroxy     Genitourinary   Abnormal uterine bleeding (AUB)    Encourage follow-up with OB/GYN        Other   Amblyopia of left eye - Primary   Anemia    Plan to confirm hemoglobin stability and iron levels preoperatively. Ongoing supplementation and monitoring by a hematologist.      Relevant Orders   CBC with Differential/Platelet   Vitamin B12   Folate   Iron and TIBC   Ferritin   Preoperative evaluation to rule out surgical contraindication   Overuse of medication    Medications Discontinued During This Encounter  Medication Reason   pantoprazole (PROTONIX) 40 MG  tablet     Return for fasting labs with urine.    Subjective:   Encounter date: 12/22/2022  Renee Cross is a 47 y.o. female who has Amblyopia of left eye; Anemia; Rheumatoid arthritis ([place]); Abnormal uterine bleeding (AUB); Severe persistent asthma; Hip arthritis; Gastroesophageal reflux disease; Perennial allergic rhinitis with seasonal variation; Preoperative evaluation to rule out surgical contraindication; Overuse of medication; and Primary hypertension on their problem list..   She  has a past medical history of Anemia, Asthma, GERD (gastroesophageal reflux disease), Hiatal hernia with GERD, Hypertension, and Sickle cell trait ([place]).Marland Kitchen   CCHIEF COMPLAINT: The patient is establishing care and requests preoperative clearance for upcoming bilateral hip surgery. No other complaints were raised during the encounter.  HISTORY OF PRESENT ILLNESS:   Hip Surgery: The patient is scheduled for hip surgery and is undergoing evaluation to ensure fitness for the procedure. She mentions experiencing significant pain due to her hips being "bone-on-bone." Current pain management includes over-the-counter analgesics such as acetaminophen or ibuprofen, taken approximately 3 days a week, 4-5 tablets at one time.  Asthma: The patient has a known history of asthma and is currently prescribed several medications for management including Trelegy, Flonase, Zyrtec, albuterol inhaler, and nebulizer. She reported improvement in symptoms after a recent administration of a steroid injection by her allergist, Dr. Carmelina Peal.  Allergies: Patient also has perennial allergic rhinitis with seasonal variation, managed with various  allergy medications including Zyrtec, loratadine (Claritin), and montelukast.  Acid Reflux: She underwent surgery approximately 20 years ago for acid reflux that caused a hiatal hernia and other complications. She is currently prescribed omeprazole for management.  Anemia/Sickle Cell Trait: The  patient has a history of anemia and sickle cell trait. She is prescribed iron and vitamin C supplements and is monitored by a hematologist.  Amblyopia: Left amblyopia noted with decreasing vision which has been previously assessed as not correctable.  Infectious Symptoms: The patient reports being sick on and off in the last month but is currently not experiencing any sickness. She previously presented with fluid in the ears and nasal congestion which seems to have improved following medical intervention.  Hypertension: Reports a history of fluctuating blood pressure, which is being monitored but not currently medicated for.  Menstrual Issues: Describes a history of heavy and irregular menstrual cycles which have been managed in the past with hormonal birth control. She has a scheduled appointment with her gynecologist.  Rheumatoid Arthritis: The patient reports a diagnosis of rheumatoid arthritis but expresses dissatisfaction with the previous rheumatologist and lacks a current management plan for this condition.  Review of Systems  Constitutional:  Negative for chills, diaphoresis, fever, malaise/fatigue and weight loss.  HENT:  Positive for congestion. Negative for ear discharge, ear pain and hearing loss.   Eyes:  Negative for blurred vision, double vision, photophobia, pain, discharge and redness.  Respiratory:  Positive for cough (improving) and wheezing (improving). Negative for sputum production and shortness of breath.   Cardiovascular:  Negative for chest pain and palpitations.  Gastrointestinal:  Negative for abdominal pain, blood in stool, constipation, diarrhea, heartburn, melena, nausea and vomiting.  Genitourinary:  Negative for dysuria, flank pain, frequency, hematuria and urgency.  Musculoskeletal:  Positive for joint pain. Negative for myalgias.  Skin:  Negative for itching and rash.  Neurological:  Negative for dizziness, tingling, tremors, speech change, seizures, loss of  consciousness, weakness and headaches.  Psychiatric/Behavioral:  Negative for depression, hallucinations, memory loss, substance abuse and suicidal ideas. The patient does not have insomnia.     Past Surgical History:  Procedure Laterality Date   HERNIA REPAIR     STOMACH SURGERY  10/03/2001   Possible fundiplication?   TONSILLECTOMY     TUBAL LIGATION      Outpatient Medications Prior to Visit  Medication Sig Dispense Refill   albuterol (PROVENTIL) (2.5 MG/3ML) 0.083% nebulizer solution Take 3 mLs (2.5 mg total) by nebulization every 4 (four) hours as needed for wheezing or shortness of breath. 75 mL 1   albuterol (VENTOLIN HFA) 108 (90 Base) MCG/ACT inhaler 2 puffs every 4-6 hours as needed for cough or wheeze. 18 g 1   cetirizine (ZYRTEC) 10 MG tablet Take 1 tablet (10 mg total) by mouth daily. 1 po q day prn allergies 30 tablet 0   fluticasone (FLONASE) 50 MCG/ACT nasal spray 2 sprays per nostril every day for stuffy nose or drainage. 16 g 5   Fluticasone-Umeclidin-Vilant (TRELEGY ELLIPTA) 200-62.5-25 MCG/ACT AEPB Inhale 1 puff into the lungs in the morning. 60 each 5   Fluticasone-Umeclidin-Vilant (TRELEGY ELLIPTA) 200-62.5-25 MCG/ACT AEPB Take 1 puff once daily to prevent coughing or wheezing. 5 each 1   loratadine (CLARITIN) 10 MG tablet Take 1 tablet  1-2 times per day 60 tablet 5   mometasone (ELOCON) 0.1 % ointment Apply topically daily. Apply once daily to red itchy areas or until resolved 45 g 3   Olopatadine HCl (PATADAY) 0.2 %  SOLN Place 1 drop into both eyes daily. 2.5 mL 5   omeprazole (PRILOSEC) 40 MG capsule Take 1 capsule (40 mg total) by mouth in the morning. 90 capsule 1   pantoprazole (PROTONIX) 40 MG tablet 1 tab(s)     Budeson-Glycopyrrol-Formoterol (BREZTRI AEROSPHERE) 160-9-4.8 MCG/ACT AERO Inhale 2 puffs into the lungs in the morning and at bedtime. (Patient not taking: Reported on 12/22/2022) 10.7 g 5   No facility-administered medications prior to visit.     History reviewed. No pertinent family history.  Social History   Socioeconomic History   Marital status: Single    Spouse name: Not on file   Number of children: Not on file   Years of education: Not on file   Highest education level: Not on file  Occupational History   Not on file  Tobacco Use   Smoking status: Former    Packs/day: 1    Types: Cigarettes    Passive exposure: Never   Smokeless tobacco: Never   Tobacco comments:    Smokes black and mild occassionally  Vaping Use   Vaping Use: Never used  Substance and Sexual Activity   Alcohol use: Yes    Alcohol/week: 2.0 standard drinks of alcohol    Types: 2 Cans of beer per week    Comment: occ   Drug use: No   Sexual activity: Yes    Birth control/protection: Surgical  Other Topics Concern   Not on file  Social History Narrative   Not on file   Social Determinants of Health   Financial Resource Strain: Not on file  Food Insecurity: Not on file  Transportation Needs: Not on file  Physical Activity: Not on file  Stress: Not on file  Social Connections: Not on file  Intimate Partner Violence: Not on file                                                                                                  Objective:  Physical Exam: BP 130/78 (BP Location: Left Arm, Patient Position: Sitting, Cuff Size: Large)   Pulse 79   Temp (!) 97.5 F (36.4 C) (Temporal)   Ht 5' 6.75" (1.695 m)   Wt 179 lb 6.4 oz (81.4 kg)   LMP 12/02/2022   SpO2 100%   BMI 28.31 kg/m   Wt Readings from Last 3 Encounters:  12/22/22 179 lb 6.4 oz (81.4 kg)  12/13/22 183 lb 11.2 oz (83.3 kg)  11/16/22 179 lb 12.8 oz (81.6 kg)   BP Readings from Last 3 Encounters:  12/22/22 130/78  12/13/22 (!) 170/102  11/16/22 (!) 140/86     Physical Exam Constitutional:      General: She is not in acute distress.    Appearance: She is ill-appearing. She is not toxic-appearing.  HENT:     Head: Normocephalic and atraumatic.     Right  Ear: Tympanic membrane, ear canal and external ear normal.     Left Ear: Tympanic membrane, ear canal and external ear normal.     Nose: Nose normal.  Eyes:     General: No  scleral icterus.    Conjunctiva/sclera: Conjunctivae normal.  Cardiovascular:     Rate and Rhythm: Normal rate and regular rhythm.     Pulses: Normal pulses.     Heart sounds: Normal heart sounds.  Pulmonary:     Effort: Pulmonary effort is normal. No respiratory distress.     Breath sounds: Normal breath sounds. No wheezing.  Abdominal:     General: Abdomen is flat. Bowel sounds are normal.     Palpations: Abdomen is soft.  Musculoskeletal:        General: Normal range of motion.     Cervical back: Normal range of motion.  Lymphadenopathy:     Cervical: No cervical adenopathy.  Skin:    General: Skin is warm and dry.     Findings: No rash.  Neurological:     General: No focal deficit present.     Mental Status: She is alert and oriented to person, place, and time. Mental status is at baseline.     Gait: Gait normal.  Psychiatric:        Mood and Affect: Mood normal.        Behavior: Behavior normal.        Thought Content: Thought content normal.        Judgment: Judgment normal.     No results found.  Recent Results (from the past 2160 hour(s))  Resp panel by RT-PCR (RSV, Flu A&B, Covid) Anterior Nasal Swab     Status: None   Collection Time: 11/16/22  6:35 PM   Specimen: Anterior Nasal Swab  Result Value Ref Range   SARS Coronavirus 2 by RT PCR NEGATIVE NEGATIVE    Comment: (NOTE) SARS-CoV-2 target nucleic acids are NOT DETECTED.  The SARS-CoV-2 RNA is generally detectable in upper respiratory specimens during the acute phase of infection. The lowest concentration of SARS-CoV-2 viral copies this assay can detect is 138 copies/mL. A negative result does not preclude SARS-Cov-2 infection and should not be used as the sole basis for treatment or other patient management decisions. A negative  result may occur with  improper specimen collection/handling, submission of specimen other than nasopharyngeal swab, presence of viral mutation(s) within the areas targeted by this assay, and inadequate number of viral copies(<138 copies/mL). A negative result must be combined with clinical observations, patient history, and epidemiological information. The expected result is Negative.  Fact Sheet for Patients:  EntrepreneurPulse.com.au  Fact Sheet for Healthcare Providers:  IncredibleEmployment.be  This test is no t yet approved or cleared by the [place] FDA and  has been authorized for detection and/or diagnosis of SARS-CoV-2 by FDA under an Emergency Use Authorization (EUA). This EUA will remain  in effect (meaning this test can be used) for the duration of the COVID-19 declaration under Section 564(b)(1) of the Act, 21 U.S.C.section 360bbb-3(b)(1), unless the authorization is terminated  or revoked sooner.       Influenza A by PCR NEGATIVE NEGATIVE   Influenza B by PCR NEGATIVE NEGATIVE    Comment: (NOTE) The Xpert Xpress SARS-CoV-2/FLU/RSV plus assay is intended as an aid in the diagnosis of influenza from Nasopharyngeal swab specimens and should not be used as a sole basis for treatment. Nasal washings and aspirates are unacceptable for Xpert Xpress SARS-CoV-2/FLU/RSV testing.  Fact Sheet for Patients: EntrepreneurPulse.com.au  Fact Sheet for Healthcare Providers: IncredibleEmployment.be  This test is not yet approved or cleared by the [place] FDA and has been authorized for detection and/or diagnosis of SARS-CoV-2 by FDA under  an Emergency Use Authorization (EUA). This EUA will remain in effect (meaning this test can be used) for the duration of the COVID-19 declaration under Section 564(b)(1) of the Act, 21 U.S.C. section 360bbb-3(b)(1), unless the authorization is terminated  or revoked.     Resp Syncytial Virus by PCR NEGATIVE NEGATIVE    Comment: (NOTE) Fact Sheet for Patients: EntrepreneurPulse.com.au  Fact Sheet for Healthcare Providers: IncredibleEmployment.be  This test is not yet approved or cleared by the [place] FDA and has been authorized for detection and/or diagnosis of SARS-CoV-2 by FDA under an Emergency Use Authorization (EUA). This EUA will remain in effect (meaning this test can be used) for the duration of the COVID-19 declaration under Section 564(b)(1) of the Act, 21 U.S.C. section 360bbb-3(b)(1), unless the authorization is terminated or revoked.  Performed at [facility], [address] [place], [place] [address]         Alesia Banda, MD, MS

## 2022-12-22 NOTE — Assessment & Plan Note (Signed)
Plan to consider a referral to a different rheumatologist based on patient's dissatisfaction with previous care after preoperative clearance is obtained.

## 2022-12-22 NOTE — Assessment & Plan Note (Signed)
Plan to continue omeprazole management. Monitor for any recurrent symptoms.

## 2022-12-22 NOTE — Assessment & Plan Note (Signed)
Encourage follow-up with OB/GYN

## 2022-12-22 NOTE — Assessment & Plan Note (Signed)
Plan to continue current regimen. Suggested to maintain follow-up with Dr. Carmelina Peal for managing and administering shots for asthma and allergic conditions.

## 2022-12-22 NOTE — Assessment & Plan Note (Signed)
Plan to obtain clearance for upcoming bilateral hip surgery. Advised on safe use of over-the-counter analgesics. Encouraged follow-up discussion regarding pain management with orthopedic surgeon.

## 2022-12-27 ENCOUNTER — Other Ambulatory Visit (INDEPENDENT_AMBULATORY_CARE_PROVIDER_SITE_OTHER): Payer: 59

## 2022-12-27 DIAGNOSIS — I10 Essential (primary) hypertension: Secondary | ICD-10-CM

## 2022-12-27 DIAGNOSIS — M069 Rheumatoid arthritis, unspecified: Secondary | ICD-10-CM

## 2022-12-27 DIAGNOSIS — M1610 Unilateral primary osteoarthritis, unspecified hip: Secondary | ICD-10-CM

## 2022-12-27 DIAGNOSIS — D649 Anemia, unspecified: Secondary | ICD-10-CM

## 2022-12-27 LAB — LIPID PANEL
Cholesterol: 163 mg/dL (ref 0–200)
HDL: 60.6 mg/dL (ref >39.00)
LDL Cholesterol: 83 mg/dL (ref 0–99)
NonHDL: 102.11
Total CHOL/HDL Ratio: 3
Triglycerides: 95 mg/dL (ref 0.0–149.0)
VLDL: 19 mg/dL (ref 0.0–40.0)

## 2022-12-27 LAB — URINALYSIS, ROUTINE W REFLEX MICROSCOPIC
Bilirubin Urine: NEGATIVE
Hgb urine dipstick: NEGATIVE
Ketones, ur: NEGATIVE
Nitrite: POSITIVE — AB
Specific Gravity, Urine: 1.02 (ref 1.000–1.030)
Total Protein, Urine: NEGATIVE
Urine Glucose: NEGATIVE
Urobilinogen, UA: 0.2 (ref 0.0–1.0)
pH: 6 (ref 5.0–8.0)

## 2022-12-27 LAB — COMPREHENSIVE METABOLIC PANEL
ALT: 11 U/L (ref 0–35)
AST: 14 U/L (ref 0–37)
Albumin: 4.2 g/dL (ref 3.5–5.2)
Alkaline Phosphatase: 73 U/L (ref 39–117)
BUN: 15 mg/dL (ref 6–23)
CO2: 23 mEq/L (ref 19–32)
Calcium: 9.1 mg/dL (ref 8.4–10.5)
Chloride: 105 mEq/L (ref 96–112)
Creatinine, Ser: 0.63 mg/dL (ref 0.40–1.20)
GFR: 106.21 mL/min (ref >60.00)
Glucose, Bld: 91 mg/dL (ref 70–99)
Potassium: 4.1 mEq/L (ref 3.5–5.1)
Sodium: 136 mEq/L (ref 135–145)
Total Bilirubin: 0.4 mg/dL (ref 0.2–1.2)
Total Protein: 7.1 g/dL (ref 6.0–8.3)

## 2022-12-27 LAB — CBC WITH DIFFERENTIAL/PLATELET
Basophils Absolute: 0.1 10*3/uL (ref 0.0–0.1)
Basophils Relative: 1.1 % (ref 0.0–3.0)
Eosinophils Absolute: 0.2 10*3/uL (ref 0.0–0.7)
Eosinophils Relative: 3.1 % (ref 0.0–5.0)
HCT: 31.7 % — ABNORMAL LOW (ref 36.0–46.0)
Hemoglobin: 10 g/dL — ABNORMAL LOW (ref 12.0–15.0)
Lymphocytes Relative: 40.3 % (ref 12.0–46.0)
Lymphs Abs: 3 10*3/uL (ref 0.7–4.0)
MCHC: 31.4 g/dL (ref 30.0–36.0)
MCV: 70 fl — ABNORMAL LOW (ref 78.0–100.0)
Monocytes Absolute: 0.4 10*3/uL (ref 0.1–1.0)
Monocytes Relative: 5 % (ref 3.0–12.0)
Neutro Abs: 3.7 10*3/uL (ref 1.4–7.7)
Neutrophils Relative %: 50.5 % (ref 43.0–77.0)
Platelets: 252 10*3/uL (ref 150.0–400.0)
RBC: 4.53 Mil/uL (ref 3.87–5.11)
RDW: 20.4 % — ABNORMAL HIGH (ref 11.5–15.5)
WBC: 7.4 10*3/uL (ref 4.0–10.5)

## 2022-12-27 LAB — TSH: TSH: 2.98 u[IU]/mL (ref 0.35–5.50)

## 2022-12-27 LAB — MICROALBUMIN / CREATININE URINE RATIO
Creatinine,U: 88 mg/dL
Microalb Creat Ratio: 4.5 mg/g (ref 0.0–30.0)
Microalb, Ur: 4 mg/dL — ABNORMAL HIGH (ref 0.0–1.9)

## 2022-12-27 LAB — HEMOGLOBIN A1C: Hgb A1c MFr Bld: 5.3 % (ref 4.6–6.5)

## 2022-12-27 LAB — VITAMIN B12: Vitamin B-12: 189 pg/mL — ABNORMAL LOW (ref 211–911)

## 2022-12-27 LAB — FOLATE: Folate: 7.4 ng/mL (ref >5.9)

## 2022-12-27 LAB — FERRITIN: Ferritin: 12.3 ng/mL (ref 10.0–291.0)

## 2022-12-27 LAB — C-REACTIVE PROTEIN: CRP: <1 mg/dL (ref 0.5–20.0)

## 2022-12-27 LAB — SEDIMENTATION RATE: Sed Rate: 46 mm/hr — ABNORMAL HIGH (ref 0–20)

## 2022-12-29 LAB — IRON AND TIBC
Iron Saturation: >96 % (ref 15–55)
Iron: 391 ug/dL (ref 27–159)
Total Iron Binding Capacity: <408 ug/dL (ref 250–450)
UIBC: <17 ug/dL — ABNORMAL LOW (ref 131–425)

## 2022-12-30 LAB — VITAMIN D 1,25 DIHYDROXY
Vitamin D 1, 25 (OH)2 Total: 50 pg/mL (ref 18–72)
Vitamin D2 1, 25 (OH)2: <8 pg/mL
Vitamin D3 1, 25 (OH)2: 50 pg/mL

## 2023-01-03 ENCOUNTER — Telehealth: Payer: Self-pay | Admitting: Family Medicine

## 2023-01-03 NOTE — Telephone Encounter (Signed)
Please advise in Dr. Thompson's absence.   

## 2023-01-03 NOTE — Telephone Encounter (Signed)
Caller Name: Saia Call back phone #: [phone]  Reason for Call: Pt would like a call back to discuss her labs.

## 2023-01-03 NOTE — Telephone Encounter (Signed)
Left patient a detailed voice message to return call to office.  Mychart message sent as well with results and for patient to follow up with Dr. Grandville Silos.

## 2023-01-10 NOTE — Telephone Encounter (Signed)
Patient is aware of annotation below and verbalized understanding.  

## 2023-01-10 NOTE — Telephone Encounter (Signed)
Caller Name: Harleyann  Call back phone #: [phone]  Reason for Call: Pt called and stated she did not understand the results given in mychart. We made appt on 4/11

## 2023-01-12 ENCOUNTER — Ambulatory Visit (INDEPENDENT_AMBULATORY_CARE_PROVIDER_SITE_OTHER): Payer: 59 | Admitting: Family Medicine

## 2023-01-12 ENCOUNTER — Encounter: Payer: Self-pay | Admitting: Family Medicine

## 2023-01-12 VITALS — BP 122/74 | HR 80 | Temp 98.3°F | Ht 66.75 in | Wt 179.0 lb

## 2023-01-12 DIAGNOSIS — Z599 Problem related to housing and economic circumstances, unspecified: Secondary | ICD-10-CM | POA: Diagnosis not present

## 2023-01-12 DIAGNOSIS — Z9151 Personal history of suicidal behavior: Secondary | ICD-10-CM

## 2023-01-12 DIAGNOSIS — M069 Rheumatoid arthritis, unspecified: Secondary | ICD-10-CM | POA: Diagnosis not present

## 2023-01-12 DIAGNOSIS — F339 Major depressive disorder, recurrent, unspecified: Secondary | ICD-10-CM

## 2023-01-12 DIAGNOSIS — M1610 Unilateral primary osteoarthritis, unspecified hip: Secondary | ICD-10-CM

## 2023-01-12 DIAGNOSIS — F1090 Alcohol use, unspecified, uncomplicated: Secondary | ICD-10-CM

## 2023-01-12 DIAGNOSIS — D649 Anemia, unspecified: Secondary | ICD-10-CM | POA: Diagnosis not present

## 2023-01-12 DIAGNOSIS — E538 Deficiency of other specified B group vitamins: Secondary | ICD-10-CM | POA: Diagnosis not present

## 2023-01-12 MED ORDER — VITAMIN B-12 1000 MCG PO TABS: 1000.0000 ug | ORAL_TABLET | Freq: Every day | ORAL | 3 refills | Status: AC

## 2023-01-12 NOTE — Patient Instructions (Addendum)
0 start taking B12 for B12 deficiency.  We are referring to psychiatry for depression.  Please contact Rheumatology for follow up.  Shaili B. Corliss Skains, MD  [address], [place], [place] [address]   [phone]

## 2023-01-12 NOTE — Progress Notes (Signed)
Assessment/Plan:   Problem List Items Addressed This Visit       Musculoskeletal and Integument   Rheumatoid arthritis    Elevated sed rate indicates possible inflammation or infection. Continue monitoring of prednisone side effects. Schedule follow-up with rheumatology for further assessment and potential DMARD therapy.      Relevant Medications   predniSONE (DELTASONE) 20 MG tablet   Hip arthritis   Relevant Medications   predniSONE (DELTASONE) 20 MG tablet     Other   Anemia    High iron saturation and ferritin levels suggest possible iron overload which needs detailed evaluation by hematology. Withhold additional iron supplementation until further assessment. Consider evaluation for causes of anemia other than iron deficiency, such as contribution from rheumatoid arthritis.      Relevant Medications   cyanocobalamin (VITAMIN B12) 1000 MCG tablet   Alcohol use disorder - Primary    Address potential alcohol use disorder, assess level of consumption, and consider strategies for reduction or cessation. Psychiatric evaluation for potential dual diagnosis with depression and to provide resources and support.      Relevant Orders   Ambulatory referral to Psychiatry   Financial difficulty   Relevant Orders   Ambulatory referral to Social Work   Episode of recurrent major depressive disorder    Referral for psychiatric evaluation to assess for mood disorders and provide appropriate treatment, which might include pharmacotherapy and psychotherapy. Continue to monitor and support mental health, including within primary care setting.      Relevant Orders   Ambulatory referral to Psychiatry   B12 deficiency    Initiate supplementation with oral vitamin B12 to correct deficiency. Monitor B12 levels and hemoglobin to assess response to therapy.      Relevant Medications   cyanocobalamin (VITAMIN B12) 1000 MCG tablet   History of suicide attempt    Medications  Discontinued During This Encounter  Medication Reason   Ferrous Sulfate (SLOW FE) 142 (45 Fe) MG TBCR     Return in about 3 months (around 04/13/2023).    Subjective:   Encounter date: 01/12/2023  Renee Cross is a 47 y.o. female who has Amblyopia of left eye; Anemia; Rheumatoid arthritis; Abnormal uterine bleeding (AUB); Severe persistent asthma; Hip arthritis; Gastroesophageal reflux disease; Perennial allergic rhinitis with seasonal variation; Preoperative evaluation to rule out surgical contraindication; Overuse of medication; Primary hypertension; Alcohol use disorder; Financial difficulty; Episode of recurrent major depressive disorder; B12 deficiency; and History of suicide attempt on their problem list..   She  has a past medical history of Anemia, Asthma, GERD (gastroesophageal reflux disease), Hiatal hernia with GERD, Hypertension, and Sickle cell trait.Marland Kitchen   CHIEF COMPLAINT: Patient presents for medical management of chronic issues and discussion of recent lab results.  HISTORY OF PRESENT ILLNESS:  Rheumatoid Arthritis. Patient has a history of rheumatoid arthritis with recent elevation in inflammatory markers. Sedimentation rate is notably high, suggesting a potential flare or other inflammatory processes. No current treatment with disease-modifying antirheumatic drugs (DMARDs) noted. Currently prescribed prednisone from ENT for nasal polyps, which may impact inflammatory labs. Patient to follow up with rheumatology for comprehensive management.  Anemia and Iron Studies. Patient has a known history of anemia with recent labs showing elevated iron saturation above normal levels at 96% and ferritin within normal limits. There is also a noted low vitamin B12 level. Patient has been taking over-the-counter ferrous sulfate (Slow FE), which may be contributing to high iron levels. Hematology follow-up required to evaluate ongoing anemia management and clarify  iron supplementation  needs.  Depression. The patient reports feelings of depression, scoring 14 on the PHQ-9, indicating moderate depression. Reports a history of suicide attempts and significant psychosocial stress due to loss of family members and personal health issues.   Alcohol Use. Patient reports weekend consumption of alcohol of 1/5 of liquor and beers consistent with heavy use.  Potential risk for alcohol use disorder and withdrawal concerns.      12/22/2022    3:32 PM  Depression screen PHQ 2/9  Decreased Interest 2  Down, Depressed, Hopeless 1  PHQ - 2 Score 3  Altered sleeping 2  Tired, decreased energy 2  Change in appetite 1  Feeling bad or failure about yourself  2  Trouble concentrating 1  Moving slowly or fidgety/restless 3  Suicidal thoughts 0  PHQ-9 Score 14  Difficult doing work/chores Very difficult      12/22/2022    3:32 PM  GAD 7 : Generalized Anxiety Score  Nervous, Anxious, on Edge 0  Control/stop worrying 2  Worry too much - different things 2  Trouble relaxing 2  Restless 0  Easily annoyed or irritable 1  Afraid - awful might happen 0  Total GAD 7 Score 7  Anxiety Difficulty Very difficult   Review of Systems  Constitutional:  Negative for chills, diaphoresis, fever, malaise/fatigue and weight loss.  Eyes:  Negative for blurred vision, double vision, photophobia, pain, discharge and redness.  Cardiovascular:  Negative for chest pain and palpitations.  Gastrointestinal:  Negative for abdominal pain, blood in stool, constipation, diarrhea, heartburn, melena, nausea and vomiting.  Genitourinary:  Negative for dysuria, flank pain, frequency, hematuria and urgency.  Musculoskeletal:  Positive for joint pain.  Skin:  Negative for itching and rash.  Neurological:  Negative for dizziness, tingling, tremors, speech change, seizures, loss of consciousness, weakness and headaches.  Psychiatric/Behavioral:  Positive for depression. Negative for hallucinations, memory loss,  substance abuse and suicidal ideas. The patient is nervous/anxious. The patient does not have insomnia.     Past Surgical History:  Procedure Laterality Date   HERNIA REPAIR     STOMACH SURGERY  10/03/2001   Possible fundiplication?   TONSILLECTOMY     TUBAL LIGATION      Outpatient Medications Prior to Visit  Medication Sig Dispense Refill   albuterol (PROVENTIL) (2.5 MG/3ML) 0.083% nebulizer solution Take 3 mLs (2.5 mg total) by nebulization every 4 (four) hours as needed for wheezing or shortness of breath. 75 mL 1   albuterol (VENTOLIN HFA) 108 (90 Base) MCG/ACT inhaler 2 puffs every 4-6 hours as needed for cough or wheeze. 18 g 1   cetirizine (ZYRTEC) 10 MG tablet Take 1 tablet (10 mg total) by mouth daily. 1 po q day prn allergies 30 tablet 0   doxycycline (VIBRAMYCIN) 100 MG capsule Take by mouth.     fluticasone (FLONASE) 50 MCG/ACT nasal spray 2 sprays per nostril every day for stuffy nose or drainage. 16 g 5   Fluticasone-Umeclidin-Vilant (TRELEGY ELLIPTA) 200-62.5-25 MCG/ACT AEPB Inhale 1 puff into the lungs in the morning. 60 each 5   Fluticasone-Umeclidin-Vilant (TRELEGY ELLIPTA) 200-62.5-25 MCG/ACT AEPB Take 1 puff once daily to prevent coughing or wheezing. 5 each 1   loratadine (CLARITIN) 10 MG tablet Take 1 tablet  1-2 times per day 60 tablet 5   mometasone (ELOCON) 0.1 % ointment Apply topically daily. Apply once daily to red itchy areas or until resolved 45 g 3   Olopatadine HCl (PATADAY) 0.2 % SOLN Place  1 drop into both eyes daily. 2.5 mL 5   omeprazole (PRILOSEC) 40 MG capsule Take 1 capsule (40 mg total) by mouth in the morning. 90 capsule 1   predniSONE (DELTASONE) 20 MG tablet One tablet orally 3 times a day for 7 days then 2 times a day for 7 days then 1 time a day for 7 days then discontinue     Ferrous Sulfate (SLOW FE) 142 (45 Fe) MG TBCR Take by mouth.     Budeson-Glycopyrrol-Formoterol (BREZTRI AEROSPHERE) 160-9-4.8 MCG/ACT AERO Inhale 2 puffs into the lungs  in the morning and at bedtime. (Patient not taking: Reported on 12/22/2022) 10.7 g 5   No facility-administered medications prior to visit.    No family history on file.  Social History   Socioeconomic History   Marital status: Single    Spouse name: Not on file   Number of children: Not on file   Years of education: Not on file   Highest education level: Not on file  Occupational History   Not on file  Tobacco Use   Smoking status: Former    Packs/day: 1    Types: Cigarettes    Passive exposure: Never   Smokeless tobacco: Never   Tobacco comments:    Smokes black and mild occassionally  Vaping Use   Vaping Use: Never used  Substance and Sexual Activity   Alcohol use: Yes    Alcohol/week: 2.0 standard drinks of alcohol    Types: 2 Cans of beer per week   Drug use: No   Sexual activity: Yes    Birth control/protection: Surgical  Other Topics Concern   Not on file  Social History Narrative   Not on file   Social Determinants of Health   Financial Resource Strain: Not on file  Food Insecurity: Not on file  Transportation Needs: Not on file  Physical Activity: Not on file  Stress: Not on file  Social Connections: Not on file  Intimate Partner Violence: Not on file                                                                                                  Objective:  Physical Exam: BP 122/74   Pulse 80   Temp 98.3 F (36.8 C) (Oral)   Ht 5' 6.75" (1.695 m)   Wt 179 lb (81.2 kg)   LMP 12/02/2022   SpO2 97%   BMI 28.25 kg/m     Physical Exam Constitutional:      General: She is not in acute distress.    Appearance: She is not toxic-appearing.  HENT:     Head: Normocephalic and atraumatic.     Right Ear: Tympanic membrane, ear canal and external ear normal.     Left Ear: Tympanic membrane, ear canal and external ear normal.     Nose: Nose normal.  Eyes:     General: No scleral icterus.    Conjunctiva/sclera: Conjunctivae normal.   Cardiovascular:     Rate and Rhythm: Normal rate and regular rhythm.     Pulses: Normal pulses.  Heart sounds: Normal heart sounds.  Pulmonary:     Effort: Pulmonary effort is normal. No respiratory distress.     Breath sounds: Normal breath sounds. No wheezing.  Abdominal:     General: Abdomen is flat. Bowel sounds are normal.     Palpations: Abdomen is soft.  Musculoskeletal:        General: Normal range of motion.     Cervical back: Normal range of motion.  Lymphadenopathy:     Cervical: No cervical adenopathy.  Skin:    General: Skin is warm and dry.     Findings: No rash.  Neurological:     General: No focal deficit present.     Mental Status: She is alert and oriented to person, place, and time. Mental status is at baseline.     Gait: Gait normal.  Psychiatric:        Mood and Affect: Mood normal.        Behavior: Behavior normal.        Thought Content: Thought content normal.        Judgment: Judgment normal.     No results found.  Recent Results (from the past 2160 hour(s))  Resp panel by RT-PCR (RSV, Flu A&B, Covid) Anterior Nasal Swab     Status: None   Collection Time: 11/16/22  6:35 PM   Specimen: Anterior Nasal Swab  Result Value Ref Range   SARS Coronavirus 2 by RT PCR NEGATIVE NEGATIVE    Comment: (NOTE) SARS-CoV-2 target nucleic acids are NOT DETECTED.  The SARS-CoV-2 RNA is generally detectable in upper respiratory specimens during the acute phase of infection. The lowest concentration of SARS-CoV-2 viral copies this assay can detect is 138 copies/mL. A negative result does not preclude SARS-Cov-2 infection and should not be used as the sole basis for treatment or other patient management decisions. A negative result may occur with  improper specimen collection/handling, submission of specimen other than nasopharyngeal swab, presence of viral mutation(s) within the areas targeted by this assay, and inadequate number of viral copies(<138  copies/mL). A negative result must be combined with clinical observations, patient history, and epidemiological information. The expected result is Negative.  Fact Sheet for Patients:  BloggerCourse.com  Fact Sheet for Healthcare Providers:  SeriousBroker.it  This test is no t yet approved or cleared by the [place] FDA and  has been authorized for detection and/or diagnosis of SARS-CoV-2 by FDA under an Emergency Use Authorization (EUA). This EUA will remain  in effect (meaning this test can be used) for the duration of the COVID-19 declaration under Section 564(b)(1) of the Act, 21 U.S.C.section 360bbb-3(b)(1), unless the authorization is terminated  or revoked sooner.       Influenza A by PCR NEGATIVE NEGATIVE   Influenza B by PCR NEGATIVE NEGATIVE    Comment: (NOTE) The Xpert Xpress SARS-CoV-2/FLU/RSV plus assay is intended as an aid in the diagnosis of influenza from Nasopharyngeal swab specimens and should not be used as a sole basis for treatment. Nasal washings and aspirates are unacceptable for Xpert Xpress SARS-CoV-2/FLU/RSV testing.  Fact Sheet for Patients: BloggerCourse.com  Fact Sheet for Healthcare Providers: SeriousBroker.it  This test is not yet approved or cleared by the [place] FDA and has been authorized for detection and/or diagnosis of SARS-CoV-2 by FDA under an Emergency Use Authorization (EUA). This EUA will remain in effect (meaning this test can be used) for the duration of the COVID-19 declaration under Section 564(b)(1) of the Act, 21 U.S.C. section  360bbb-3(b)(1), unless the authorization is terminated or revoked.     Resp Syncytial Virus by PCR NEGATIVE NEGATIVE    Comment: (NOTE) Fact Sheet for Patients: BloggerCourse.com  Fact Sheet for Healthcare  Providers: SeriousBroker.it  This test is not yet approved or cleared by the [place] FDA and has been authorized for detection and/or diagnosis of SARS-CoV-2 by FDA under an Emergency Use Authorization (EUA). This EUA will remain in effect (meaning this test can be used) for the duration of the COVID-19 declaration under Section 564(b)(1) of the Act, 21 U.S.C. section 360bbb-3(b)(1), unless the authorization is terminated or revoked.  Performed at [facility], [address] [place], [place] [address]   C-reactive protein     Status: None   Collection Time: 12/27/22  9:02 AM  Result Value Ref Range   CRP <1.0 0.5 - 20.0 mg/dL  Sedimentation rate     Status: Abnormal   Collection Time: 12/27/22  9:02 AM  Result Value Ref Range   Sed Rate 46 (H) 0 - 20 mm/hr  Ferritin     Status: None   Collection Time: 12/27/22  9:02 AM  Result Value Ref Range   Ferritin 12.3 10.0 - 291.0 ng/mL  Iron and TIBC     Status: Abnormal   Collection Time: 12/27/22  9:02 AM  Result Value Ref Range   Total Iron Binding Capacity <408 250 - 450 ug/dL   UIBC <62 (L) 130 - 865 ug/dL    Comment: **Verified by repeat analysis**   Iron 391 (HH) 27 - 159 ug/dL    Comment: **Verified by repeat analysis**   Iron Saturation >96 (HH) 15 - 55 %  Folate     Status: None   Collection Time: 12/27/22  9:02 AM  Result Value Ref Range   Folate 7.4 >5.9 ng/mL  Vitamin B12     Status: Abnormal   Collection Time: 12/27/22  9:02 AM  Result Value Ref Range   Vitamin B-12 189 (L) 211 - 911 pg/mL  Comprehensive metabolic panel     Status: None   Collection Time: 12/27/22  9:02 AM  Result Value Ref Range   Sodium 136 135 - 145 mEq/L   Potassium 4.1 3.5 - 5.1 mEq/L   Chloride 105 96 - 112 mEq/L   CO2 23 19 - 32 mEq/L   Glucose, Bld 91 70 - 99 mg/dL   BUN 15 6 - 23 mg/dL   Creatinine, Ser 7.84 0.40 - 1.20 mg/dL   Total Bilirubin 0.4 0.2 - 1.2 mg/dL   Alkaline Phosphatase 73  39 - 117 U/L   AST 14 0 - 37 U/L   ALT 11 0 - 35 U/L   Total Protein 7.1 6.0 - 8.3 g/dL   Albumin 4.2 3.5 - 5.2 g/dL   GFR 696.29 >52.84 mL/min    Comment: Calculated using the CKD-EPI Creatinine Equation (2021)   Calcium 9.1 8.4 - 10.5 mg/dL  CBC with Differential/Platelet     Status: Abnormal   Collection Time: 12/27/22  9:02 AM  Result Value Ref Range   WBC 7.4 4.0 - 10.5 K/uL   RBC 4.53 3.87 - 5.11 Mil/uL   Hemoglobin 10.0 (L) 12.0 - 15.0 g/dL   HCT 13.2 (L) 44.0 - 10.2 %   MCV 70.0 (L) 78.0 - 100.0 fl   MCHC 31.4 30.0 - 36.0 g/dL   RDW 72.5 (H) 36.6 - 44.0 %   Platelets 252.0 150.0 - 400.0 K/uL  Neutrophils Relative % 50.5 43.0 - 77.0 %    Comment: A Manual Differential was performed and is consistent with the Automated Differential.   Lymphocytes Relative 40.3 12.0 - 46.0 %   Monocytes Relative 5.0 3.0 - 12.0 %   Eosinophils Relative 3.1 0.0 - 5.0 %   Basophils Relative 1.1 0.0 - 3.0 %   Neutro Abs 3.7 R 1.4 - 7.7 K/uL   Lymphs Abs 3.0 R 0.7 - 4.0 K/uL   Monocytes Absolute 0.4 R 0.1 - 1.0 K/uL   Eosinophils Absolute 0.2 R 0.0 - 0.7 K/uL   Basophils Absolute 0.1 R 0.0 - 0.1 K/uL  Vitamin D 1,25 dihydroxy     Status: None   Collection Time: 12/27/22  9:02 AM  Result Value Ref Range   Vitamin D 1, 25 (OH)2 Total 50 18 - 72 pg/mL   Vitamin D3 1, 25 (OH)2 50 pg/mL   Vitamin D2 1, 25 (OH)2 <8 pg/mL    Comment: (Note) Vitamin D3, 1,25(OH)2 indicates both endogenous  production and supplementation. Vitamin D2, 1,25(OH)2 is  an indicator of exogenous sources, such as diet or  supplementation. Interpretation and therapy are based on  measurement of Vitamin D, 1,25 (OH)2, Total. . This test was developed, and its analytical performance  characteristics have been determined by [facility]. It has not been cleared or approved by the  FDA. This assay has been validated pursuant to the CLIA  regulations and is used for clinical purposes. . For additional information,  please refer to http://education.[facility].com/faq/FAQ199 (This link is being provided for  informational/educational purposes only.) . [facility] [address] [address] [place] [id number] [phone] Junita PushIthiel James L.  CaulFrame, MD, PhD   Urinalysis, Routine w reflex microscopic     Status: Abnormal   Collection Time: 12/27/22  9:02 AM  Result Value Ref Range   Color, Urine YELLOW Yellow;Lt. Yellow;Straw;Dark Yellow;Amber;Green;Red;Brown   APPearance Cloudy (A) Clear;Turbid;Slightly Cloudy;Cloudy   Specific Gravity, Urine 1.020 1.000 - 1.030   pH 6.0 5.0 - 8.0   Total Protein, Urine NEGATIVE Negative   Urine Glucose NEGATIVE Negative   Ketones, ur NEGATIVE Negative   Bilirubin Urine NEGATIVE Negative   Hgb urine dipstick NEGATIVE Negative   Urobilinogen, UA 0.2 0.0 - 1.0   Leukocytes,Ua SMALL (A) Negative   Nitrite POSITIVE (A) Negative   WBC, UA 11-20/hpf (A) 0-2/hpf   RBC / HPF 0-2/hpf 0-2/hpf   Mucus, UA Presence of (A) None   Squamous Epithelial / HPF Few(5-10/hpf) (A) Rare(0-4/hpf)   Bacteria, UA Many(>50/hpf) (A) None  Hemoglobin A1c     Status: None   Collection Time: 12/27/22  9:02 AM  Result Value Ref Range   Hgb A1c MFr Bld 5.3 4.6 - 6.5 %    Comment: Glycemic Control Guidelines for People with Diabetes:Non Diabetic:  <6%Goal of Therapy: <7%Additional Action Suggested:  >8%   Lipid panel     Status: None   Collection Time: 12/27/22  9:02 AM  Result Value Ref Range   Cholesterol 163 0 - 200 mg/dL    Comment: ATP III Classification       Desirable:  < 200 mg/dL               Borderline High:  200 - 239 mg/dL          High:  > = 784240 mg/dL   Triglycerides 69.695.0 0.0 - 149.0 mg/dL    Comment: Normal:  <295<150 mg/dLBorderline High:  150 -  199 mg/dL   HDL 54.09 >81.19 mg/dL   VLDL 14.7 0.0 - 82.9 mg/dL   LDL Cholesterol 83 0 - 99 mg/dL   Total CHOL/HDL Ratio 3     Comment:                Men          Women1/2 Average Risk     3.4           3.3Average Risk          5.0          4.42X Average Risk          9.6          7.13X Average Risk          15.0          11.0                       NonHDL 102.11     Comment: NOTE:  Non-HDL goal should be 30 mg/dL higher than patient's LDL goal (i.e. LDL goal of < 70 mg/dL, would have non-HDL goal of < 100 mg/dL)  Microalbumin / creatinine urine ratio     Status: Abnormal   Collection Time: 12/27/22  9:02 AM  Result Value Ref Range   Microalb, Ur 4.0 (H) 0.0 - 1.9 mg/dL   Creatinine,U 56.2 mg/dL   Microalb Creat Ratio 4.5 0.0 - 30.0 mg/g  TSH     Status: None   Collection Time: 12/27/22  9:02 AM  Result Value Ref Range   TSH 2.98 0.35 - 5.50 uIU/mL        Garner Nash, MD, MS

## 2023-01-13 ENCOUNTER — Telehealth: Payer: Self-pay | Admitting: Family Medicine

## 2023-01-13 NOTE — Telephone Encounter (Signed)
Pt called and said can you give her a call she been waiting on some paperwork over a month now to be filled out

## 2023-01-14 DIAGNOSIS — Z9151 Personal history of suicidal behavior: Secondary | ICD-10-CM | POA: Insufficient documentation

## 2023-01-14 DIAGNOSIS — Z599 Problem related to housing and economic circumstances, unspecified: Secondary | ICD-10-CM | POA: Insufficient documentation

## 2023-01-14 DIAGNOSIS — F339 Major depressive disorder, recurrent, unspecified: Secondary | ICD-10-CM | POA: Insufficient documentation

## 2023-01-14 DIAGNOSIS — F1090 Alcohol use, unspecified, uncomplicated: Secondary | ICD-10-CM | POA: Insufficient documentation

## 2023-01-14 DIAGNOSIS — E538 Deficiency of other specified B group vitamins: Secondary | ICD-10-CM

## 2023-01-14 HISTORY — DX: Alcohol use, unspecified, uncomplicated: F10.90

## 2023-01-14 HISTORY — DX: Problem related to housing and economic circumstances, unspecified: Z59.9

## 2023-01-14 HISTORY — DX: Deficiency of other specified B group vitamins: E53.8

## 2023-01-14 HISTORY — DX: Personal history of suicidal behavior: Z91.51

## 2023-01-14 HISTORY — DX: Major depressive disorder, recurrent, unspecified: F33.9

## 2023-01-14 NOTE — Assessment & Plan Note (Signed)
High iron saturation and ferritin levels suggest possible iron overload which needs detailed evaluation by hematology. Withhold additional iron supplementation until further assessment. Consider evaluation for causes of anemia other than iron deficiency, such as contribution from rheumatoid arthritis.

## 2023-01-14 NOTE — Assessment & Plan Note (Signed)
Address potential alcohol use disorder, assess level of consumption, and consider strategies for reduction or cessation. Psychiatric evaluation for potential dual diagnosis with depression and to provide resources and support.

## 2023-01-14 NOTE — Assessment & Plan Note (Signed)
Initiate supplementation with oral vitamin B12 to correct deficiency. Monitor B12 levels and hemoglobin to assess response to therapy.

## 2023-01-14 NOTE — Assessment & Plan Note (Signed)
Elevated sed rate indicates possible inflammation or infection. Continue monitoring of prednisone side effects. Schedule follow-up with rheumatology for further assessment and potential DMARD therapy.

## 2023-01-14 NOTE — Assessment & Plan Note (Signed)
Referral for psychiatric evaluation to assess for mood disorders and provide appropriate treatment, which might include pharmacotherapy and psychotherapy. Continue to monitor and support mental health, including within primary care setting.

## 2023-01-16 ENCOUNTER — Telehealth (HOSPITAL_COMMUNITY): Payer: Self-pay | Admitting: Psychiatry

## 2023-01-16 NOTE — Telephone Encounter (Signed)
Caller Name: Irielle Call back phone #: [phone]  Reason for Call: pt asking if we have completed and returned paperwork to [facility].   Pt asking if we can enter new referral to Rheumatology, Dr. Corliss Skains.

## 2023-01-16 NOTE — Telephone Encounter (Signed)
D:  Returned call to pt, but there was no answer.  Pt had mentioned that Dr. Janee Morn referred her to MH-IOP.  A:  Placed call to oriented.  Left vm for pt to call the cm back.

## 2023-01-17 ENCOUNTER — Ambulatory Visit: Payer: 59 | Admitting: Allergy and Immunology

## 2023-01-17 ENCOUNTER — Telehealth (HOSPITAL_COMMUNITY): Payer: Self-pay | Admitting: Psychiatry

## 2023-01-17 NOTE — Telephone Encounter (Signed)
D:  Placed call to pt again.  Oriented pt.  Pt states she is interested in having a psychiatrist instead of group.  "I need to see someone about this depression and anxiety."  Pt denies SI/HI or A/V hallucinations.  Pt admits to drinking on the weekends (ie. 1/2 pint of ETOH and a couple of beers).  Discussed CD-IOP or Dual Dx Program with pt.  Pt declined; stating she just wants to see a psychiatrist.  A:  Transferred pt to the front desk for an appt.  R:  Pt receptive.

## 2023-01-19 ENCOUNTER — Telehealth: Payer: Self-pay

## 2023-01-19 NOTE — Telephone Encounter (Signed)
-----   Message from Garnette Gunner, MD sent at 01/19/2023  7:02 AM EDT ----- Signed surgical clearance.  Placed on desk.

## 2023-01-19 NOTE — Telephone Encounter (Signed)
Patient advised that faxed sent successfully to [facility]. Pt asking if we can enter new referral to Rheumatology, Dr. Corliss Skains. Please advise

## 2023-01-20 ENCOUNTER — Other Ambulatory Visit: Payer: Self-pay | Admitting: Family Medicine

## 2023-01-20 DIAGNOSIS — M069 Rheumatoid arthritis, unspecified: Secondary | ICD-10-CM

## 2023-01-23 DIAGNOSIS — M1611 Unilateral primary osteoarthritis, right hip: Secondary | ICD-10-CM | POA: Diagnosis not present

## 2023-01-26 DIAGNOSIS — D500 Iron deficiency anemia secondary to blood loss (chronic): Secondary | ICD-10-CM | POA: Diagnosis not present

## 2023-01-26 DIAGNOSIS — D509 Iron deficiency anemia, unspecified: Secondary | ICD-10-CM | POA: Diagnosis not present

## 2023-01-26 DIAGNOSIS — I10 Essential (primary) hypertension: Secondary | ICD-10-CM | POA: Diagnosis not present

## 2023-01-30 NOTE — H&P (Signed)
HIP ARTHROPLASTY ADMISSION H&P  Patient ID: Mistydawn Rengifo MRN: [id number] DOB/AGE: 1976/01/01 47 y.o.  Chief Complaint: right hip pain.  Planned Procedure Date: 02/14/23 Medical and Cardiac Clearance by Dr. Fanny Bien   Pulmonology clearance by Dr. Laurette Schimke   HPI: Renee Cross is a 47 y.o. female who presents for evaluation of OA RIGHT HIP. The patient has a history of pain and functional disability in the right hip due to arthritis and has failed non-surgical conservative treatments for greater than 12 weeks to include NSAID's and/or analgesics, corticosteriod injections, use of assistive devices, and activity modification.  Onset of symptoms was gradual, starting 3 years ago with gradually worsening course since that time. The patient noted no past surgery on the right hip.  Patient currently rates pain at 10 out of 10 with activity. Patient has night pain, worsening of pain with activity and weight bearing, and pain that interferes with activities of daily living.  Patient has evidence of subchondral sclerosis, periarticular osteophytes, joint space narrowing, and osteolysis  by imaging studies.  There is no active infection.  Past Medical History:  Diagnosis Date   Anemia    Asthma    GERD (gastroesophageal reflux disease)    Hiatal hernia with GERD    Hypertension    Sickle cell trait [facility])    Past Surgical History:  Procedure Laterality Date   HERNIA REPAIR     STOMACH SURGERY  10/03/2001   Possible fundiplication?   TONSILLECTOMY     TUBAL LIGATION     No Known Allergies Prior to Admission medications   Medication Sig Start Date End Date Taking? Authorizing Provider  albuterol (PROVENTIL) (2.5 MG/3ML) 0.083% nebulizer solution Take 3 mLs (2.5 mg total) by nebulization every 4 (four) hours as needed for wheezing or shortness of breath. 03/03/22   Kozlow, Alvira Philips, MD  albuterol (VENTOLIN HFA) 108 (90 Base) MCG/ACT inhaler 2 puffs every 4-6 hours as needed for cough or  wheeze. 12/14/22   Kozlow, Alvira Philips, MD  Budeson-Glycopyrrol-Formoterol (BREZTRI AEROSPHERE) 160-9-4.8 MCG/ACT AERO Inhale 2 puffs into the lungs in the morning and at bedtime. Patient not taking: Reported on 12/22/2022 03/01/22   [name], MD  cetirizine (ZYRTEC) 10 MG tablet Take 1 tablet (10 mg total) by mouth daily. 1 po q day prn allergies 06/15/15   Rolland Porter, MD  cyanocobalamin (VITAMIN B12) 1000 MCG tablet Take 1 tablet (1,000 mcg total) by mouth daily. 01/12/23 01/07/24  Garnette Gunner, MD  fluticasone (FLONASE) 50 MCG/ACT nasal spray 2 sprays per nostril every day for stuffy nose or drainage. 12/14/22   Kozlow, Alvira Philips, MD  Fluticasone-Umeclidin-Vilant (TRELEGY ELLIPTA) 200-62.5-25 MCG/ACT AEPB Inhale 1 puff into the lungs in the morning. 12/13/22   Kozlow, Alvira Philips, MD  Fluticasone-Umeclidin-Vilant (TRELEGY ELLIPTA) 200-62.5-25 MCG/ACT AEPB Take 1 puff once daily to prevent coughing or wheezing. 12/14/22   Kozlow, Alvira Philips, MD  loratadine (CLARITIN) 10 MG tablet Take 1 tablet  1-2 times per day 12/14/22   [name], MD  mometasone (ELOCON) 0.1 % ointment Apply topically daily. Apply once daily to red itchy areas or until resolved 12/14/22   Kozlow, Alvira Philips, MD  Olopatadine HCl (PATADAY) 0.2 % SOLN Place 1 drop into both eyes daily. 11/02/21   Kozlow, Alvira Philips, MD  omeprazole (PRILOSEC) 40 MG capsule Take 1 capsule (40 mg total) by mouth in the morning. 12/14/22   Kozlow, Alvira Philips, MD   Social History   Socioeconomic History  Marital status: Single    Spouse name: Not on file   Number of children: Not on file   Years of education: Not on file   Highest education level: Not on file  Occupational History   Not on file  Tobacco Use   Smoking status: Former    Packs/day: 1    Types: Cigarettes    Passive exposure: Never   Smokeless tobacco: Never   Tobacco comments:    Smokes black and mild occassionally  Vaping Use   Vaping Use: Never used  Substance and Sexual Activity   Alcohol use:  Yes    Alcohol/week: 2.0 standard drinks of alcohol    Types: 2 Cans of beer per week   Drug use: No   Sexual activity: Yes    Birth control/protection: Surgical  Other Topics Concern   Not on file  Social History Narrative   Not on file   Social Determinants of Health   Financial Resource Strain: Not on file  Food Insecurity: Not on file  Transportation Needs: Not on file  Physical Activity: Not on file  Stress: Not on file  Social Connections: Not on file   No family history on file.  ROS: Currently denies lightheadedness, dizziness, Fever, chills, CP, SOB.   No personal history of DVT, PE, MI, or CVA. No loose teeth or dentures All other systems have been reviewed and were otherwise currently negative with the exception of those mentioned in the HPI and as above.  Objective: Vitals: Ht: 5'7" Wt: 179 lbs Temp: 98.7 BP: 140/90 Pulse: 76 O2 99% on room air.   Physical Exam: General: Alert, NAD. Trendelenberg Gait  HEENT: EOMI, Good Neck Extension  Pulm: No increased work of breathing. Very mild inspiratory wheezing auscultated in upper lung fields, but otherwise lungs are clear with no rales or rhonchi.   CV: RRR, No m/g/r appreciated  GI: soft, NT, ND. BS x 4 quadrants Neuro: CN II-XII grossly intact without focal deficit.  Sensation intact distally Skin: No lesions in the area of chief complaint MSK/Surgical Site: + TTP. Hip ROM decreased d/t pain. + Stinchfield. + SLR. + FABER/FADIR. Decreased strength.  NVI.    Imaging Review Plain radiographs demonstrate severe degenerative joint disease of the bilateral hips.   The bone quality appears to be poor for age and reported activity level.  Preoperative templating of the joint replacement has been completed, documented, and submitted to the Operating Room personnel in order to optimize intra-operative equipment management.  Assessment: OA RIGHT HIP Active Problems:   * No active hospital problems. *   Plan: Plan  for Procedure(s): TOTAL HIP ARTHROPLASTY ANTERIOR APPROACH  The patient history, physical exam, clinical judgement of the provider and imaging are consistent with end stage degenerative joint disease and total joint arthroplasty is deemed medically necessary. The treatment options including medical management, injection therapy, and arthroplasty were discussed at length. The risks and benefits of Procedure(s): TOTAL HIP ARTHROPLASTY ANTERIOR APPROACH were presented and reviewed.  The risks of nonoperative treatment, versus surgical intervention including but not limited to continued pain, aseptic loosening, stiffness, dislocation/subluxation, infection, bleeding, nerve injury, blood clots, cardiopulmonary complications, morbidity, mortality, among others were discussed. The patient verbalizes understanding and wishes to proceed with the plan.  Patient is being admitted for surgery, pain control, PT, prophylactic antibiotics, VTE prophylaxis, progressive ambulation, ADL's and discharge planning.   Dental prophylaxis discussed and recommended for 2 years postoperatively.  The patient does meet the criteria for TXA which will  be used perioperatively.   ASA 81 mg BID will be used postoperatively for DVT prophylaxis in addition to SCDs, and early ambulation. Plan for Oxycodone, Mobic, Tylenol for pain.   Robaxin for muscle spasm.  Zofran for nausea and vomiting. Senokot is for constipation pevention Pharmacy- [facility] in [facility] The patient is planning to be discharged home with OPPT and into the care of her daughter Ernest Pine who can be reached at [phone] Follow up appt 03/01/23 at 4:15pm     Marzetta Board Office [phone] 01/30/2023 3:53 PM

## 2023-01-31 ENCOUNTER — Telehealth (HOSPITAL_BASED_OUTPATIENT_CLINIC_OR_DEPARTMENT_OTHER): Payer: 59 | Admitting: Psychiatry

## 2023-01-31 ENCOUNTER — Encounter (HOSPITAL_COMMUNITY): Payer: Self-pay | Admitting: Psychiatry

## 2023-01-31 VITALS — Wt 179.0 lb

## 2023-01-31 DIAGNOSIS — F4312 Post-traumatic stress disorder, chronic: Secondary | ICD-10-CM

## 2023-01-31 DIAGNOSIS — F1210 Cannabis abuse, uncomplicated: Secondary | ICD-10-CM | POA: Diagnosis not present

## 2023-01-31 DIAGNOSIS — F331 Major depressive disorder, recurrent, moderate: Secondary | ICD-10-CM | POA: Diagnosis not present

## 2023-01-31 DIAGNOSIS — F1010 Alcohol abuse, uncomplicated: Secondary | ICD-10-CM | POA: Diagnosis not present

## 2023-01-31 MED ORDER — DULOXETINE HCL 20 MG PO CPEP
20.0000 mg | ORAL_CAPSULE | Freq: Every day | ORAL | 0 refills | Status: DC
Start: 2023-01-31 — End: 2023-02-22
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
## 2023-01-31 NOTE — Progress Notes (Signed)
COVID Vaccine Completed:  Date of COVID positive in last 90 days:  PCP - Fanny Bien, MD Cardiologist -   Chest x-ray -  EKG -  Stress Test -  ECHO -  Cardiac Cath -  Pacemaker/ICD device last checked: Spinal Cord Stimulator:  Bowel Prep -   Sleep Study -  CPAP -   Fasting Blood Sugar -  Checks Blood Sugar _____ times a day  Last dose of GLP1 agonist-  N/A GLP1 instructions:  N/A   Last dose of SGLT-2 inhibitors-  N/A SGLT-2 instructions: N/A   Blood Thinner Instructions:  Time Aspirin Instructions: Last Dose:  Activity level:  Can go up a flight of stairs and perform activities of daily living without stopping and without symptoms of chest pain or shortness of breath.  Able to exercise without symptoms  Unable to go up a flight of stairs without symptoms of     Anesthesia review:   Patient denies shortness of breath, fever, cough and chest pain at PAT appointment  Patient verbalized understanding of instructions that were given to them at the PAT appointment. Patient was also instructed that they will need to review over the PAT instructions again at home before surgery.

## 2023-02-01 ENCOUNTER — Encounter (HOSPITAL_COMMUNITY)
Admission: RE | Admit: 2023-02-01 | Discharge: 2023-02-01 | Disposition: A | Discharge status: Home / Self Care | Payer: 59 | Source: Ambulatory Visit | Attending: Orthopedic Surgery | Admitting: Orthopedic Surgery

## 2023-02-01 ENCOUNTER — Other Ambulatory Visit: Payer: Self-pay

## 2023-02-01 ENCOUNTER — Encounter (HOSPITAL_COMMUNITY): Payer: Self-pay

## 2023-02-01 VITALS — BP 150/85 | HR 78 | Temp 98.5°F | Resp 12 | Ht 66.0 in | Wt 183.0 lb

## 2023-02-01 DIAGNOSIS — D649 Anemia, unspecified: Secondary | ICD-10-CM

## 2023-02-01 DIAGNOSIS — R9431 Abnormal electrocardiogram [ECG] [EKG]: Secondary | ICD-10-CM | POA: Insufficient documentation

## 2023-02-01 DIAGNOSIS — I10 Essential (primary) hypertension: Secondary | ICD-10-CM | POA: Insufficient documentation

## 2023-02-01 DIAGNOSIS — K449 Diaphragmatic hernia without obstruction or gangrene: Secondary | ICD-10-CM | POA: Insufficient documentation

## 2023-02-01 DIAGNOSIS — Z87891 Personal history of nicotine dependence: Secondary | ICD-10-CM | POA: Insufficient documentation

## 2023-02-01 DIAGNOSIS — D571 Sickle-cell disease without crisis: Secondary | ICD-10-CM | POA: Diagnosis not present

## 2023-02-01 DIAGNOSIS — M1611 Unilateral primary osteoarthritis, right hip: Secondary | ICD-10-CM | POA: Diagnosis not present

## 2023-02-01 DIAGNOSIS — K219 Gastro-esophageal reflux disease without esophagitis: Secondary | ICD-10-CM | POA: Insufficient documentation

## 2023-02-01 DIAGNOSIS — J45909 Unspecified asthma, uncomplicated: Secondary | ICD-10-CM | POA: Insufficient documentation

## 2023-02-01 DIAGNOSIS — M069 Rheumatoid arthritis, unspecified: Secondary | ICD-10-CM | POA: Diagnosis not present

## 2023-02-01 DIAGNOSIS — Z01818 Encounter for other preprocedural examination: Secondary | ICD-10-CM | POA: Diagnosis not present

## 2023-02-01 HISTORY — DX: Post-traumatic stress disorder, unspecified: F43.10

## 2023-02-01 HISTORY — DX: Pneumonia, unspecified organism: J18.9

## 2023-02-01 HISTORY — DX: Depression, unspecified: F32.A

## 2023-02-01 HISTORY — DX: Unspecified osteoarthritis, unspecified site: M19.90

## 2023-02-01 LAB — BASIC METABOLIC PANEL
Anion gap: 8 (ref 5–15)
BUN: 12 mg/dL (ref 6–20)
CO2: 22 mmol/L (ref 22–32)
Calcium: 8.3 mg/dL — ABNORMAL LOW (ref 8.9–10.3)
Chloride: 106 mmol/L (ref 98–111)
Creatinine, Ser: 0.79 mg/dL (ref 0.44–1.00)
GFR, Estimated: >60 mL/min (ref >60)
Glucose, Bld: 98 mg/dL (ref 70–99)
Potassium: 3.6 mmol/L (ref 3.5–5.1)
Sodium: 136 mmol/L (ref 135–145)

## 2023-02-01 LAB — TYPE AND SCREEN
ABO/RH(D): O POS
Antibody Screen: NEGATIVE

## 2023-02-01 LAB — CBC
HCT: 30.7 % — ABNORMAL LOW (ref 36.0–46.0)
Hemoglobin: 9.4 g/dL — ABNORMAL LOW (ref 12.0–15.0)
MCH: 23 pg — ABNORMAL LOW (ref 26.0–34.0)
MCHC: 30.6 g/dL (ref 30.0–36.0)
MCV: 75.2 fL — ABNORMAL LOW (ref 80.0–100.0)
Platelets: 238 10*3/uL (ref 150–400)
RBC: 4.08 MIL/uL (ref 3.87–5.11)
RDW: 22.6 % — ABNORMAL HIGH (ref 11.5–15.5)
WBC: 8.2 10*3/uL (ref 4.0–10.5)
nRBC: 0 % (ref 0.0–0.2)

## 2023-02-01 LAB — SURGICAL PCR SCREEN
MRSA, PCR: NEGATIVE
Staphylococcus aureus: NEGATIVE

## 2023-02-01 NOTE — Progress Notes (Signed)
Hgb 9.4 results routed to Dr. Eulah Pont.

## 2023-02-01 NOTE — Patient Instructions (Signed)
SURGICAL WAITING ROOM VISITATION  Patients having surgery or a procedure may have no more than 2 support people in the waiting area - these visitors may rotate.    Children under the age of 28 must have an adult with them who is not the patient.  Due to an increase in RSV and influenza rates and associated hospitalizations, children ages 79 and under may not visit patients in [facility] hospitals.  If the patient needs to stay at the hospital during part of their recovery, the visitor guidelines for inpatient rooms apply. Pre-op nurse will coordinate an appropriate time for 1 support person to accompany patient in pre-op.  This support person may not rotate.    Please refer to the [facility] website for the visitor guidelines for Inpatients (after your surgery is over and you are in a regular room).    Your procedure is scheduled on: 02/14/23   Report to [facility] Main Entrance    Report to admitting at 6:00 AM   Call this number if you have problems the morning of surgery [phone]   Do not eat food :After Midnight.   After Midnight you may have the following liquids until 5:30 AM DAY OF SURGERY  Water Non-Citrus Juices (without pulp, NO RED-Apple, [place] grape, [place] cranberry) Black Coffee (NO MILK/CREAM OR CREAMERS, sugar ok)  Clear Tea (NO MILK/CREAM OR CREAMERS, sugar ok) regular and decaf                             Plain Jell-O (NO RED)                                           Fruit ices (not with fruit pulp, NO RED)                                     Popsicles (NO RED)                                                               Sports drinks like Gatorade (NO RED)                  The day of surgery:  Drink ONE (1) Pre-Surgery Clear Ensure at 5:30 AM the morning of surgery. Drink in one sitting. Do not sip.  This drink was given to you during your hospital  pre-op appointment visit. Nothing else to drink after completing the  Pre-Surgery Clear  Ensure.          If you have questions, please contact your surgeon's office.   FOLLOW BOWEL PREP AND ANY ADDITIONAL PRE OP INSTRUCTIONS YOU RECEIVED FROM YOUR SURGEON'S OFFICE!!!     Oral Hygiene is also important to reduce your risk of infection.                                    Remember - BRUSH YOUR TEETH THE MORNING OF SURGERY WITH YOUR REGULAR TOOTHPASTE  DENTURES  WILL BE REMOVED PRIOR TO SURGERY PLEASE DO NOT APPLY "Poly grip" OR ADHESIVES!!!   Take these medicines the morning of surgery with A SIP OF WATER: Claritin, Omeprazole, Doxycyline, Prednisone, Duloxetine, Nasal spray, Eye drops, Inhalers               You may not have any metal on your body including hair pins, jewelry, and body piercing             Do not wear make-up, lotions, powders, perfumes, or deodorant  Do not wear nail polish including gel and S&S, artificial/acrylic nails, or any other type of covering on natural nails including finger and toenails. If you have artificial nails, gel coating, etc. that needs to be removed by a nail salon please have this removed prior to surgery or surgery may need to be canceled/ delayed if the surgeon/ anesthesia feels like they are unable to be safely monitored.   Do not shave  48 hours prior to surgery.    Do not bring valuables to the hospital. [place] IS NOT             RESPONSIBLE   FOR VALUABLES.   Contacts, glasses, dentures or bridgework may not be worn into surgery.  DO NOT BRING YOUR HOME MEDICATIONS TO THE HOSPITAL. PHARMACY WILL DISPENSE MEDICATIONS LISTED ON YOUR MEDICATION LIST TO YOU DURING YOUR ADMISSION IN THE HOSPITAL!    Patients discharged on the day of surgery will not be allowed to drive home.  Someone NEEDS to stay with you for the first 24 hours after anesthesia.   Special Instructions: Bring a copy of your healthcare power of attorney and living will documents the day of surgery if you haven't scanned them before.              Please read  over the following fact sheets you were given: IF YOU HAVE QUESTIONS ABOUT YOUR PRE-OP INSTRUCTIONS PLEASE CALL [phone] [place]    If you received a COVID test during your pre-op visit  it is requested that you wear a mask when out in public, stay away from anyone that may not be feeling well and notify your surgeon if you develop symptoms. If you test positive for Covid or have been in contact with anyone that has tested positive in the last 10 days please notify you surgeon.  FAILURE TO FOLLOW THESE INSTRUCTIONS MAY RESULT IN THE CANCELLATION OF YOUR SURGERY  PATIENT SIGNATURE_________________________________  NURSE SIGNATURE__________________________________  ________________________________________________________________________ WHAT IS A BLOOD TRANSFUSION? Blood Transfusion Information  A transfusion is the replacement of blood or some of its parts. Blood is made up of multiple cells which provide different functions. Red blood cells carry oxygen and are used for blood loss replacement. [place] blood cells fight against infection. Platelets control bleeding. Plasma helps clot blood. Other blood products are available for specialized needs, such as hemophilia or other clotting disorders. BEFORE THE TRANSFUSION  Who gives blood for transfusions?  Healthy volunteers who are fully evaluated to make sure their blood is safe. This is blood bank blood. Transfusion therapy is the safest it has ever been in the practice of medicine. Before blood is taken from a donor, a complete history is taken to make sure that person has no history of diseases nor engages in risky social behavior (examples are intravenous drug use or sexual activity with multiple partners). The donor's travel history is screened to minimize risk of transmitting infections, such as malaria. The donated blood is tested  for signs of infectious diseases, such as HIV and hepatitis. The blood is then tested to be sure it is  compatible with you in order to minimize the chance of a transfusion reaction. If you or a relative donates blood, this is often done in anticipation of surgery and is not appropriate for emergency situations. It takes many days to process the donated blood. RISKS AND COMPLICATIONS Although transfusion therapy is very safe and saves many lives, the main dangers of transfusion include:  Getting an infectious disease. Developing a transfusion reaction. This is an allergic reaction to something in the blood you were given. Every precaution is taken to prevent this. The decision to have a blood transfusion has been considered carefully by your caregiver before blood is given. Blood is not given unless the benefits outweigh the risks. AFTER THE TRANSFUSION Right after receiving a blood transfusion, you will usually feel much better and more energetic. This is especially true if your red blood cells have gotten low (anemic). The transfusion raises the level of the red blood cells which carry oxygen, and this usually causes an energy increase. The nurse administering the transfusion will monitor you carefully for complications. HOME CARE INSTRUCTIONS  No special instructions are needed after a transfusion. You may find your energy is better. Speak with your caregiver about any limitations on activity for underlying diseases you may have. SEEK MEDICAL CARE IF:  Your condition is not improving after your transfusion. You develop redness or irritation at the intravenous (IV) site. SEEK IMMEDIATE MEDICAL CARE IF:  Any of the following symptoms occur over the next 12 hours: Shaking chills. You have a temperature by mouth above 102 F (38.9 C), not controlled by medicine. Chest, back, or muscle pain. People around you feel you are not acting correctly or are confused. Shortness of breath or difficulty breathing. Dizziness and fainting. You get a rash or develop hives. You have a decrease in urine  output. Your urine turns a dark color or changes to pink, red, or brown. Any of the following symptoms occur over the next 10 days: You have a temperature by mouth above 102 F (38.9 C), not controlled by medicine. Shortness of breath. Weakness after normal activity. The [place] part of the eye turns yellow (jaundice). You have a decrease in the amount of urine or are urinating less often. Your urine turns a dark color or changes to pink, red, or brown. Document Released: 09/16/2000 Document Revised: 12/12/2011 Document Reviewed: 05/05/2008 [facility] Patient Information 2014 [place], [place].  _______________________________________________________________________

## 2023-02-02 ENCOUNTER — Other Ambulatory Visit: Payer: Self-pay | Admitting: Family Medicine

## 2023-02-02 DIAGNOSIS — D649 Anemia, unspecified: Secondary | ICD-10-CM

## 2023-02-06 DIAGNOSIS — Z23 Encounter for immunization: Secondary | ICD-10-CM | POA: Diagnosis not present

## 2023-02-06 DIAGNOSIS — Z113 Encounter for screening for infections with a predominantly sexual mode of transmission: Secondary | ICD-10-CM | POA: Diagnosis not present

## 2023-02-06 DIAGNOSIS — D500 Iron deficiency anemia secondary to blood loss (chronic): Secondary | ICD-10-CM | POA: Diagnosis not present

## 2023-02-06 DIAGNOSIS — Z01411 Encounter for gynecological examination (general) (routine) with abnormal findings: Secondary | ICD-10-CM | POA: Diagnosis not present

## 2023-02-06 DIAGNOSIS — Z1231 Encounter for screening mammogram for malignant neoplasm of breast: Secondary | ICD-10-CM | POA: Diagnosis not present

## 2023-02-06 DIAGNOSIS — N920 Excessive and frequent menstruation with regular cycle: Secondary | ICD-10-CM | POA: Diagnosis not present

## 2023-02-06 DIAGNOSIS — D509 Iron deficiency anemia, unspecified: Secondary | ICD-10-CM | POA: Diagnosis not present

## 2023-02-06 DIAGNOSIS — Z124 Encounter for screening for malignant neoplasm of cervix: Secondary | ICD-10-CM | POA: Diagnosis not present

## 2023-02-07 NOTE — Progress Notes (Signed)
Case: [id number] Date/Time: 02/14/23 0815   Procedure: TOTAL HIP ARTHROPLASTY ANTERIOR APPROACH (Right: Hip)   Anesthesia type: Spinal   Pre-op diagnosis: OA RIGHT HIP   Location: [facility] ROOM 04 / [facility]   Surgeons: Sheral Apley, MD       DISCUSSION: Renee Cross is a 47 year old female presents to PAT clinic prior to right THA.  Past medical history significant for former tobacco use (quit January 2024), current marijuana use, asthma, anemia, hypertension, GERD, hiatal hernia, RA.  Patient last saw her PCP on 01/12/2023.  Asthma noted to not be well-controlled when she saw her allergist on 12/13/2022 but she had run out of her medication due to loss of insurance.  Medications were restarted and after she saw her PCP on 12/22/22 she noted an improvement in her symptoms.  Also of note patient has chronic anemia and is followed by hematology at [facility].  She was seen on 01/26/2023 and advised to follow-up with OB/GYN as the source of her anemia is thought to be due to GYN blood loss.  She was also referred to GI for screening colonoscopy.  Hemoglobin is 9.4 at PAT visit which is close to her baseline. Discussed with [facility] office who are aware of results and will proceed with surgery at this time.   VS: BP (!) 150/85   Pulse 78   Temp 36.9 C (Oral)   Resp 12   Ht 5\' 6"  (1.676 m)   Wt 83 kg   LMP 01/26/2023 (Approximate)   SpO2 100%   BMI 29.54 kg/m   PROVIDERS: Garnette Gunner, MD Hematology: Dr. Vanessa Ralphs  LABS: Labs reviewed: Repeat CBC DOS (all labs ordered are listed, but only abnormal results are displayed)  Labs Reviewed  BASIC METABOLIC PANEL - Abnormal; Notable for the following components:      Result Value   Calcium 8.3 (*)    All other components within normal limits  CBC - Abnormal; Notable for the following components:   Hemoglobin 9.4 (*)    HCT 30.7 (*)    MCV 75.2 (*)    MCH 23.0 (*)    RDW 22.6 (*)    All other components  within normal limits  SURGICAL PCR SCREEN  TYPE AND SCREEN     IMAGES: n/a   EKG 02/01/23:  Normal sinus rhythm T wave inversions inferior leads No significant change was found compared to prior EKG   CV: n/a  Past Medical History:  Diagnosis Date   Anemia    Arthritis    Asthma    Depression    GERD (gastroesophageal reflux disease)    Hiatal hernia with GERD    Hypertension    Pneumonia    PTSD (post-traumatic stress disorder)    Sickle cell trait (HCC)     Past Surgical History:  Procedure Laterality Date   HERNIA REPAIR     SINUS EXPLORATION     STOMACH SURGERY  10/03/2001   Possible fundiplication?   TONSILLECTOMY     TUBAL LIGATION      MEDICATIONS:  albuterol (PROVENTIL) (2.5 MG/3ML) 0.083% nebulizer solution   albuterol (VENTOLIN HFA) 108 (90 Base) MCG/ACT inhaler   Budeson-Glycopyrrol-Formoterol (BREZTRI AEROSPHERE) 160-9-4.8 MCG/ACT AERO   cetirizine (ZYRTEC) 10 MG tablet   cyanocobalamin (VITAMIN B12) 1000 MCG tablet   doxycycline (MONODOX) 100 MG capsule   DULoxetine (CYMBALTA) 20 MG capsule   ferrous sulfate 325 (65 FE) MG EC tablet   fluticasone (FLONASE) 50 MCG/ACT nasal  spray   Fluticasone-Umeclidin-Vilant (TRELEGY ELLIPTA) 200-62.5-25 MCG/ACT AEPB   Fluticasone-Umeclidin-Vilant (TRELEGY ELLIPTA) 200-62.5-25 MCG/ACT AEPB   loratadine (CLARITIN) 10 MG tablet   mometasone (ELOCON) 0.1 % ointment   Multiple Vitamin (MULTIVITAMIN WITH MINERALS) TABS tablet   Olopatadine HCl (PATADAY) 0.2 % SOLN   omeprazole (PRILOSEC) 40 MG capsule   predniSONE (DELTASONE) 20 MG tablet   No current facility-administered medications for this encounter.   Marcille Blanco [facility] Short Stay/Anesthesiology [facility] Phone [phone] 02/07/2023 11:43 AM

## 2023-02-07 NOTE — Anesthesia Preprocedure Evaluation (Addendum)
Anesthesia Evaluation  Patient identified by MRN, date of birth, ID band Patient awake    Reviewed: Allergy & Precautions, H&P , NPO status , Patient's Chart, lab work & pertinent test results  Airway Mallampati: II  TM Distance: >3 FB Neck ROM: Full    Dental no notable dental hx. (+) Teeth Intact, Dental Advisory Given   Pulmonary neg pulmonary ROS, asthma , pneumonia, Patient abstained from smoking., former smoker   Pulmonary exam normal breath sounds clear to auscultation       Cardiovascular Exercise Tolerance: Good hypertension, Pt. on medications negative cardio ROS Normal cardiovascular exam Rhythm:Regular Rate:Normal     Neuro/Psych  PSYCHIATRIC DISORDERS Anxiety Depression    negative neurological ROS  negative psych ROS   GI/Hepatic negative GI ROS, Neg liver ROS, hiatal hernia,GERD  ,,  Endo/Other  negative endocrine ROS    Renal/GU negative Renal ROS  negative genitourinary   Musculoskeletal negative musculoskeletal ROS (+) Arthritis ,    Abdominal   Peds negative pediatric ROS (+)  Hematology negative hematology ROS (+) Blood dyscrasia, Sickle cell trait and anemia   Anesthesia Other Findings   Reproductive/Obstetrics negative OB ROS                             Anesthesia Physical Anesthesia Plan  ASA: 3  Anesthesia Plan: MAC and Spinal   Post-op Pain Management: Minimal or no pain anticipated   Induction: Intravenous  PONV Risk Score and Plan: 2 and Ondansetron and Treatment may vary due to age or medical condition  Airway Management Planned: Natural Airway, Simple Face Mask and Nasal Cannula  Additional Equipment: None  Intra-op Plan:   Post-operative Plan:   Informed Consent: I have reviewed the patients History and Physical, chart, labs and discussed the procedure including the risks, benefits and alternatives for the proposed anesthesia with the patient or  authorized representative who has indicated his/her understanding and acceptance.       Plan Discussed with: Anesthesiologist  Anesthesia Plan Comments: (See PAT note from 5/1 by Sherlie Ban PA-C DISCUSSION: Renee Cross is a 47 year old female presents to PAT clinic prior to right THA.  Past medical history significant for former tobacco use (quit January 2024), current marijuana use, asthma, anemia, hypertension, GERD, hiatal hernia, RA.   Patient last saw her PCP on 01/12/2023.  Asthma noted to not be well-controlled when she saw her allergist on 12/13/2022 but she had run out of her medication due to loss of insurance.  Medications were restarted and after she saw her PCP on 12/22/22 she noted an improvement in her symptoms.   Also of note patient has chronic anemia and is followed by hematology at [facility].  She was seen on 01/26/2023 and advised to follow-up with OB/GYN as the source of her anemia is thought to be due to GYN blood loss.  She was also referred to GI for screening colonoscopy.  Hemoglobin is 9.4 at PAT visit which is close to her baseline. Discussed with [facility] office who are aware of results and will proceed with surgery at this time. )        Anesthesia Quick Evaluation

## 2023-02-14 ENCOUNTER — Encounter (HOSPITAL_COMMUNITY): Payer: Self-pay | Admitting: Orthopedic Surgery

## 2023-02-14 ENCOUNTER — Ambulatory Visit (HOSPITAL_COMMUNITY): Payer: 59

## 2023-02-14 ENCOUNTER — Ambulatory Visit (HOSPITAL_COMMUNITY): Payer: 59 | Admitting: Physician Assistant

## 2023-02-14 ENCOUNTER — Other Ambulatory Visit: Payer: Self-pay

## 2023-02-14 ENCOUNTER — Ambulatory Visit (HOSPITAL_BASED_OUTPATIENT_CLINIC_OR_DEPARTMENT_OTHER): Payer: 59 | Admitting: Medical

## 2023-02-14 ENCOUNTER — Encounter (HOSPITAL_COMMUNITY)
Admission: RE | Disposition: A | Discharge status: Home / Self Care | Payer: Self-pay | Source: Ambulatory Visit | Attending: Orthopedic Surgery

## 2023-02-14 ENCOUNTER — Ambulatory Visit (HOSPITAL_COMMUNITY)
Admission: RE | Admit: 2023-02-14 | Discharge: 2023-02-14 | Disposition: A | Discharge status: Home / Self Care | Payer: 59 | Source: Ambulatory Visit | Attending: Orthopedic Surgery | Admitting: Orthopedic Surgery

## 2023-02-14 DIAGNOSIS — D649 Anemia, unspecified: Secondary | ICD-10-CM

## 2023-02-14 DIAGNOSIS — K219 Gastro-esophageal reflux disease without esophagitis: Secondary | ICD-10-CM | POA: Insufficient documentation

## 2023-02-14 DIAGNOSIS — J45909 Unspecified asthma, uncomplicated: Secondary | ICD-10-CM | POA: Insufficient documentation

## 2023-02-14 DIAGNOSIS — Z87891 Personal history of nicotine dependence: Secondary | ICD-10-CM | POA: Insufficient documentation

## 2023-02-14 DIAGNOSIS — I10 Essential (primary) hypertension: Secondary | ICD-10-CM | POA: Diagnosis not present

## 2023-02-14 DIAGNOSIS — Z09 Encounter for follow-up examination after completed treatment for conditions other than malignant neoplasm: Secondary | ICD-10-CM | POA: Diagnosis not present

## 2023-02-14 DIAGNOSIS — M1611 Unilateral primary osteoarthritis, right hip: Secondary | ICD-10-CM

## 2023-02-14 DIAGNOSIS — D573 Sickle-cell trait: Secondary | ICD-10-CM | POA: Diagnosis not present

## 2023-02-14 DIAGNOSIS — F418 Other specified anxiety disorders: Secondary | ICD-10-CM

## 2023-02-14 HISTORY — PX: TOTAL HIP ARTHROPLASTY: SHX124

## 2023-02-14 LAB — CBC
HCT: 33.3 % — ABNORMAL LOW (ref 36.0–46.0)
Hemoglobin: 10.2 g/dL — ABNORMAL LOW (ref 12.0–15.0)
MCH: 23.2 pg — ABNORMAL LOW (ref 26.0–34.0)
MCHC: 30.6 g/dL (ref 30.0–36.0)
MCV: 75.9 fL — ABNORMAL LOW (ref 80.0–100.0)
Platelets: 190 10*3/uL (ref 150–400)
RBC: 4.39 MIL/uL (ref 3.87–5.11)
RDW: 24 % — ABNORMAL HIGH (ref 11.5–15.5)
WBC: 8.4 10*3/uL (ref 4.0–10.5)
nRBC: 0 % (ref 0.0–0.2)

## 2023-02-14 LAB — ABO/RH: ABO/RH(D): O POS

## 2023-02-14 LAB — POCT PREGNANCY, URINE: Preg Test, Ur: NEGATIVE

## 2023-02-14 SURGERY — ARTHROPLASTY, HIP, TOTAL, ANTERIOR APPROACH
Anesthesia: Monitor Anesthesia Care | Site: Hip | Laterality: Right

## 2023-02-14 MED ORDER — FENTANYL CITRATE PF 50 MCG/ML IJ SOSY
PREFILLED_SYRINGE | INTRAMUSCULAR | Status: AC
  Filled 2023-02-14: qty 2

## 2023-02-14 MED ORDER — ONDANSETRON HCL 4 MG PO TABS: 4.0000 mg | ORAL_TABLET | Freq: Four times a day (QID) | ORAL | Status: DC | PRN

## 2023-02-14 MED ORDER — OXYCODONE HCL 5 MG/5ML PO SOLN: 5.0000 mg | Freq: Once | ORAL | Status: AC | PRN

## 2023-02-14 MED ORDER — ACETAMINOPHEN 325 MG PO TABS: 325.0000 mg | ORAL_TABLET | Freq: Four times a day (QID) | ORAL | Status: DC | PRN

## 2023-02-14 MED ORDER — PROPOFOL 10 MG/ML IV BOLUS
INTRAVENOUS | Status: AC
  Filled 2023-02-14: qty 20

## 2023-02-14 MED ORDER — PHENYLEPHRINE HCL (PRESSORS) 10 MG/ML IV SOLN
INTRAVENOUS | Status: AC
  Filled 2023-02-14: qty 1

## 2023-02-14 MED ORDER — OXYCODONE HCL 5 MG PO TABS
ORAL_TABLET | ORAL | Status: AC
  Filled 2023-02-14: qty 1

## 2023-02-14 MED ORDER — MEPERIDINE HCL 50 MG/ML IJ SOLN: 6.2500 mg | INTRAMUSCULAR | Status: DC | PRN

## 2023-02-14 MED ORDER — NAPROXEN 250 MG PO TABS
250.0000 mg | ORAL_TABLET | Freq: Two times a day (BID) | ORAL | Status: DC
  Filled 2023-02-14: qty 1

## 2023-02-14 MED ORDER — ONDANSETRON HCL 4 MG/2ML IJ SOLN: 4.0000 mg | Freq: Once | INTRAMUSCULAR | Status: DC | PRN

## 2023-02-14 MED ORDER — DEXAMETHASONE SODIUM PHOSPHATE 10 MG/ML IJ SOLN
INTRAMUSCULAR | Status: DC | PRN
  Administered 2023-02-14: 10 mg via INTRAVENOUS

## 2023-02-14 MED ORDER — OXYCODONE HCL 5 MG PO TABS: 5.0000 mg | ORAL_TABLET | ORAL | Status: DC | PRN

## 2023-02-14 MED ORDER — METOCLOPRAMIDE HCL 5 MG PO TABS: 5.0000 mg | ORAL_TABLET | Freq: Three times a day (TID) | ORAL | Status: DC | PRN

## 2023-02-14 MED ORDER — ASPIRIN 81 MG PO CHEW: 81.0000 mg | CHEWABLE_TABLET | Freq: Two times a day (BID) | ORAL | 0 refills | Status: AC

## 2023-02-14 MED ORDER — SODIUM CHLORIDE FLUSH 0.9 % IV SOLN
INTRAVENOUS | Status: DC | PRN
  Administered 2023-02-14: 10 mL

## 2023-02-14 MED ORDER — POVIDONE-IODINE 10 % EX SWAB: 2.0000 | Freq: Once | CUTANEOUS | Status: DC

## 2023-02-14 MED ORDER — 0.9 % SODIUM CHLORIDE (POUR BTL) OPTIME
TOPICAL | Status: DC | PRN
  Administered 2023-02-14: 1000 mL

## 2023-02-14 MED ORDER — PROPOFOL 10 MG/ML IV BOLUS
INTRAVENOUS | Status: DC | PRN
  Administered 2023-02-14 (×5): 20 mg via INTRAVENOUS

## 2023-02-14 MED ORDER — TRANEXAMIC ACID-NACL 1000-0.7 MG/100ML-% IV SOLN
1000.0000 mg | INTRAVENOUS | Status: AC
  Administered 2023-02-14: 1000 mg via INTRAVENOUS
  Filled 2023-02-14: qty 100

## 2023-02-14 MED ORDER — ONDANSETRON HCL 4 MG/2ML IJ SOLN: 4.0000 mg | Freq: Four times a day (QID) | INTRAMUSCULAR | Status: DC | PRN

## 2023-02-14 MED ORDER — CHLORHEXIDINE GLUCONATE 0.12 % MT SOLN
15.0000 mL | Freq: Once | OROMUCOSAL | Status: AC
  Administered 2023-02-14: 15 mL via OROMUCOSAL

## 2023-02-14 MED ORDER — LIDOCAINE HCL (PF) 2 % IJ SOLN
INTRAMUSCULAR | Status: AC
  Filled 2023-02-14: qty 5

## 2023-02-14 MED ORDER — ACETAMINOPHEN 160 MG/5ML PO SOLN: 325.0000 mg | ORAL | Status: DC | PRN

## 2023-02-14 MED ORDER — CEFAZOLIN SODIUM-DEXTROSE 2-4 GM/100ML-% IV SOLN
2.0000 g | INTRAVENOUS | Status: AC
  Administered 2023-02-14: 2 g via INTRAVENOUS
  Filled 2023-02-14: qty 100

## 2023-02-14 MED ORDER — HYDROMORPHONE HCL 1 MG/ML IJ SOLN
INTRAMUSCULAR | Status: AC
  Filled 2023-02-14: qty 1

## 2023-02-14 MED ORDER — OXYCODONE HCL 5 MG PO TABS
10.0000 mg | ORAL_TABLET | ORAL | Status: DC | PRN
  Administered 2023-02-14: 10 mg via ORAL

## 2023-02-14 MED ORDER — MIDAZOLAM HCL 5 MG/5ML IJ SOLN
INTRAMUSCULAR | Status: DC | PRN
  Administered 2023-02-14: 2 mg via INTRAVENOUS

## 2023-02-14 MED ORDER — FENTANYL CITRATE PF 50 MCG/ML IJ SOSY
25.0000 ug | PREFILLED_SYRINGE | INTRAMUSCULAR | Status: DC | PRN
  Administered 2023-02-14 (×2): 50 ug via INTRAVENOUS

## 2023-02-14 MED ORDER — FENTANYL CITRATE (PF) 100 MCG/2ML IJ SOLN
INTRAMUSCULAR | Status: AC
  Filled 2023-02-14: qty 2

## 2023-02-14 MED ORDER — LACTATED RINGERS IV SOLN: INTRAVENOUS | Status: DC

## 2023-02-14 MED ORDER — SENNA-DOCUSATE SODIUM 8.6-50 MG PO TABS: 2.0000 | ORAL_TABLET | Freq: Every day | ORAL | 0 refills | Status: DC

## 2023-02-14 MED ORDER — PROPOFOL 500 MG/50ML IV EMUL
INTRAVENOUS | Status: DC | PRN
  Administered 2023-02-14: 50 ug/kg/min via INTRAVENOUS

## 2023-02-14 MED ORDER — LACTATED RINGERS IV BOLUS
250.0000 mL | Freq: Once | INTRAVENOUS | Status: AC
  Administered 2023-02-14: 250 mL via INTRAVENOUS

## 2023-02-14 MED ORDER — ONDANSETRON HCL 4 MG PO TABS: 4.0000 mg | ORAL_TABLET | Freq: Three times a day (TID) | ORAL | 0 refills | Status: AC | PRN

## 2023-02-14 MED ORDER — METHOCARBAMOL 500 MG PO TABS: 500.0000 mg | ORAL_TABLET | Freq: Three times a day (TID) | ORAL | 0 refills | Status: DC | PRN

## 2023-02-14 MED ORDER — MELOXICAM 15 MG PO TABS: 15.0000 mg | ORAL_TABLET | Freq: Every day | ORAL | 0 refills | Status: DC

## 2023-02-14 MED ORDER — BUPIVACAINE IN DEXTROSE 0.75-8.25 % IT SOLN
INTRATHECAL | Status: DC | PRN
  Administered 2023-02-14: 1.8 mL via INTRATHECAL

## 2023-02-14 MED ORDER — OXYCODONE HCL 5 MG PO TABS: ORAL_TABLET | ORAL | 0 refills | Status: AC

## 2023-02-14 MED ORDER — DEXAMETHASONE SODIUM PHOSPHATE 10 MG/ML IJ SOLN: 8.0000 mg | Freq: Once | INTRAMUSCULAR | Status: DC

## 2023-02-14 MED ORDER — ACETAMINOPHEN 500 MG PO TABS
1000.0000 mg | ORAL_TABLET | Freq: Once | ORAL | Status: AC
  Administered 2023-02-14: 1000 mg via ORAL
  Filled 2023-02-14: qty 2

## 2023-02-14 MED ORDER — ACETAMINOPHEN 325 MG PO TABS: 325.0000 mg | ORAL_TABLET | ORAL | Status: DC | PRN

## 2023-02-14 MED ORDER — ORAL CARE MOUTH RINSE: 15.0000 mL | Freq: Once | OROMUCOSAL | Status: AC

## 2023-02-14 MED ORDER — DEXAMETHASONE SODIUM PHOSPHATE 10 MG/ML IJ SOLN
INTRAMUSCULAR | Status: AC
  Filled 2023-02-14: qty 1

## 2023-02-14 MED ORDER — ACETAMINOPHEN 500 MG PO TABS
ORAL_TABLET | ORAL | Status: AC
  Filled 2023-02-14: qty 2

## 2023-02-14 MED ORDER — PROPOFOL 1000 MG/100ML IV EMUL
INTRAVENOUS | Status: AC
  Filled 2023-02-14: qty 100

## 2023-02-14 MED ORDER — BUPIVACAINE LIPOSOME 1.3 % IJ SUSP
INTRAMUSCULAR | Status: AC
  Filled 2023-02-14: qty 10

## 2023-02-14 MED ORDER — FENTANYL CITRATE (PF) 100 MCG/2ML IJ SOLN
INTRAMUSCULAR | Status: DC | PRN
  Administered 2023-02-14: 100 ug via INTRAVENOUS

## 2023-02-14 MED ORDER — ONDANSETRON HCL 4 MG/2ML IJ SOLN
INTRAMUSCULAR | Status: DC | PRN
  Administered 2023-02-14: 4 mg via INTRAVENOUS

## 2023-02-14 MED ORDER — MIDAZOLAM HCL 2 MG/2ML IJ SOLN
INTRAMUSCULAR | Status: AC
  Filled 2023-02-14: qty 2

## 2023-02-14 MED ORDER — ACETAMINOPHEN 500 MG PO TABS: 1000.0000 mg | ORAL_TABLET | Freq: Three times a day (TID) | ORAL | 0 refills | Status: AC

## 2023-02-14 MED ORDER — OXYCODONE HCL 5 MG PO TABS
ORAL_TABLET | ORAL | Status: AC
  Filled 2023-02-14: qty 2

## 2023-02-14 MED ORDER — WATER FOR IRRIGATION, STERILE IR SOLN
Status: DC | PRN
  Administered 2023-02-14: 2000 mL

## 2023-02-14 MED ORDER — HYDROMORPHONE HCL 1 MG/ML IJ SOLN
0.5000 mg | INTRAMUSCULAR | Status: DC | PRN
  Administered 2023-02-14: 1 mg via INTRAVENOUS

## 2023-02-14 MED ORDER — ACETAMINOPHEN 500 MG PO TABS
1000.0000 mg | ORAL_TABLET | Freq: Four times a day (QID) | ORAL | Status: DC
  Administered 2023-02-14: 1000 mg via ORAL

## 2023-02-14 MED ORDER — METOCLOPRAMIDE HCL 5 MG/ML IJ SOLN: 5.0000 mg | Freq: Three times a day (TID) | INTRAMUSCULAR | Status: DC | PRN

## 2023-02-14 MED ORDER — ONDANSETRON HCL 4 MG/2ML IJ SOLN
INTRAMUSCULAR | Status: AC
  Filled 2023-02-14: qty 2

## 2023-02-14 MED ORDER — SODIUM CHLORIDE (PF) 0.9 % IJ SOLN
INTRAMUSCULAR | Status: AC
  Filled 2023-02-14: qty 10

## 2023-02-14 MED ORDER — BUPIVACAINE LIPOSOME 1.3 % IJ SUSP: 10.0000 mL | Freq: Once | INTRAMUSCULAR | Status: DC

## 2023-02-14 MED ORDER — FENTANYL CITRATE PF 50 MCG/ML IJ SOSY
PREFILLED_SYRINGE | INTRAMUSCULAR | Status: AC
  Filled 2023-02-14: qty 1

## 2023-02-14 MED ORDER — LACTATED RINGERS IV BOLUS
500.0000 mL | Freq: Once | INTRAVENOUS | Status: AC
  Administered 2023-02-14: 500 mL via INTRAVENOUS

## 2023-02-14 MED ORDER — OXYCODONE HCL 5 MG PO TABS
5.0000 mg | ORAL_TABLET | Freq: Once | ORAL | Status: AC | PRN
  Administered 2023-02-14: 5 mg via ORAL

## 2023-02-14 MED ORDER — BUPIVACAINE LIPOSOME 1.3 % IJ SUSP
INTRAMUSCULAR | Status: DC | PRN
  Administered 2023-02-14: 10 mL

## 2023-02-14 SURGICAL SUPPLY — 48 items
APL PRP STRL LF DISP 70% ISPRP (MISCELLANEOUS) ×1
BAG COUNTER SPONGE SURGICOUNT (BAG) IMPLANT
BAG SPEC THK2 15X12 ZIP CLS (MISCELLANEOUS)
BAG SPNG CNTER NS LX DISP (BAG)
BAG ZIPLOCK 12X15 (MISCELLANEOUS) IMPLANT
BLADE SAG 18X100X1.27 (BLADE) ×1 IMPLANT
BLADE SURG SZ10 CARB STEEL (BLADE) ×1 IMPLANT
CHLORAPREP W/TINT 26 (MISCELLANEOUS) ×1 IMPLANT
CLSR STERI-STRIP ANTIMIC 1/2X4 (GAUZE/BANDAGES/DRESSINGS) ×1 IMPLANT
COVER PERINEAL POST (MISCELLANEOUS) ×1 IMPLANT
COVER SURGICAL LIGHT HANDLE (MISCELLANEOUS) ×1 IMPLANT
DRAPE IMP U-DRAPE 54X76 (DRAPES) ×1 IMPLANT
DRAPE STERI IOBAN 125X83 (DRAPES) ×1 IMPLANT
DRAPE U-SHAPE 47X51 STRL (DRAPES) ×2 IMPLANT
DRSG AQUACEL AG ADV 3.5X10 (GAUZE/BANDAGES/DRESSINGS) IMPLANT
DRSG MEPILEX POST OP 4X8 (GAUZE/BANDAGES/DRESSINGS) ×1 IMPLANT
ELECT REM PT RETURN 15FT ADLT (MISCELLANEOUS) ×1 IMPLANT
GLOVE BIO SURGEON STRL SZ7.5 (GLOVE) ×1 IMPLANT
GLOVE BIOGEL PI IND STRL 7.5 (GLOVE) ×1 IMPLANT
GLOVE BIOGEL PI IND STRL 8 (GLOVE) ×1 IMPLANT
GLOVE SURG SYN 7.5  E (GLOVE) ×1
GLOVE SURG SYN 7.5 E (GLOVE) ×1 IMPLANT
GLOVE SURG SYN 7.5 PF PI (GLOVE) ×1 IMPLANT
GOWN STRL REUS W/ TWL LRG LVL3 (GOWN DISPOSABLE) ×1 IMPLANT
GOWN STRL REUS W/ TWL XL LVL3 (GOWN DISPOSABLE) ×1 IMPLANT
GOWN STRL REUS W/TWL LRG LVL3 (GOWN DISPOSABLE) ×1
GOWN STRL REUS W/TWL XL LVL3 (GOWN DISPOSABLE) ×1
HEAD BIOLOX HIP 36/-5 (Joint) IMPLANT
HIP BIOLOX HD 36/-5 (Joint) ×1 IMPLANT
HOLDER FOLEY CATH W/STRAP (MISCELLANEOUS) IMPLANT
INSERT 0 DEGREE 36 (Miscellaneous) IMPLANT
KIT TURNOVER KIT A (KITS) IMPLANT
MANIFOLD NEPTUNE II (INSTRUMENTS) ×1 IMPLANT
NS IRRIG 1000ML POUR BTL (IV SOLUTION) ×1 IMPLANT
PACK ANTERIOR HIP CUSTOM (KITS) ×1 IMPLANT
PROTECTOR NERVE ULNAR (MISCELLANEOUS) ×1 IMPLANT
SCREW HEX LP 6.5X20 (Screw) IMPLANT
SHELL TRIDENT II CLUST 50 (Shell) ×2 IMPLANT
SPIKE FLUID TRANSFER (MISCELLANEOUS) ×2 IMPLANT
STEM HIP 127 DEG (Stem) IMPLANT
SUT MNCRL AB 3-0 PS2 18 (SUTURE) ×1 IMPLANT
SUT VIC AB 0 CT1 36 (SUTURE) ×1 IMPLANT
SUT VIC AB 1 CT1 36 (SUTURE) ×1 IMPLANT
SUT VIC AB 2-0 CT1 27 (SUTURE) ×2
SUT VIC AB 2-0 CT1 TAPERPNT 27 (SUTURE) ×2 IMPLANT
TRAY FOLEY MTR SLVR 16FR STAT (SET/KITS/TRAYS/PACK) IMPLANT
TUBE SUCTION HIGH CAP CLEAR NV (SUCTIONS) ×1 IMPLANT
WATER STERILE IRR 1000ML POUR (IV SOLUTION) ×2 IMPLANT

## 2023-02-14 NOTE — Transfer of Care (Signed)
Immediate Anesthesia Transfer of Care Note  [name]  Procedure(s) Performed: TOTAL HIP ARTHROPLASTY ANTERIOR APPROACH (Right: Hip)  Patient Location: PACU  Anesthesia Type:MAC  Level of Consciousness: awake and alert   Airway & Oxygen Therapy: Patient Spontanous Breathing  Post-op Assessment: Report given to RN and Post -op Vital signs reviewed and stable  Post vital signs: Reviewed and stable  Last Vitals:  Vitals Value Taken Time  BP 145/92 02/14/23 1036  Temp 36.6 C 02/14/23 1036  Pulse 64 02/14/23 1045  Resp 22 02/14/23 1045  SpO2 100 % 02/14/23 1045  Vitals shown include unvalidated device data.  Last Pain:  Vitals:   02/14/23 1036  TempSrc:   PainSc: 0-No pain         Complications: No notable events documented.

## 2023-02-14 NOTE — Discharge Instructions (Signed)
You may bear weight as tolerated. Keep your dressing on and dry until follow up. Take medicine to prevent blood clots as directed. Take pain medicine as needed with the goal of transitioning to over the counter medicines.    INSTRUCTIONS AFTER JOINT REPLACEMENT   Remove items at home which could result in a fall. This includes throw rugs or furniture in walking pathways ICE to the affected joint every three hours while awake for 30 minutes at a time, for at least the first 3-5 days, and then as needed for pain and swelling.  Continue to use ice for pain and swelling. You may notice swelling that will progress down to the foot and ankle.  This is normal after surgery.  Elevate your leg when you are not up walking on it.   Continue to use the breathing machine you got in the hospital (incentive spirometer) which will help keep your temperature down.  It is common for your temperature to cycle up and down following surgery, especially at night when you are not up moving around and exerting yourself.  The breathing machine keeps your lungs expanded and your temperature down.   DIET:  As you were doing prior to hospitalization, we recommend a well-balanced diet.  DRESSING / WOUND CARE / SHOWERING  You may shower 3 days after surgery, but keep the wounds dry during showering.  You may use an occlusive plastic wrap (Press'n Seal for example) with blue painter's tape at edges, NO SOAKING/SUBMERGING IN THE BATHTUB.  If the bandage gets wet, call the office.   ACTIVITY  Increase activity slowly as tolerated, but follow the weight bearing instructions below.   No driving for 6 weeks or until further direction given by your physician.  You cannot drive while taking narcotics.  No lifting or carrying greater than 10 lbs. until further directed by your surgeon. Avoid periods of inactivity such as sitting longer than an hour when not asleep. This helps prevent blood clots.  You may return to work once you  are authorized by your doctor.    WEIGHT BEARING   Weight bearing as tolerated with assist device (walker, cane, etc) as directed, use it as long as suggested by your surgeon or therapist, typically at least 4-6 weeks.   EXERCISES  Results after joint replacement surgery are often greatly improved when you follow the exercise, range of motion and muscle strengthening exercises prescribed by your doctor. Safety measures are also important to protect the joint from further injury. Any time any of these exercises cause you to have increased pain or swelling, decrease what you are doing until you are comfortable again and then slowly increase them. If you have problems or questions, call your caregiver or physical therapist for advice.   Rehabilitation is important following a joint replacement. After just a few days of immobilization, the muscles of the leg can become weakened and shrink (atrophy).  These exercises are designed to build up the tone and strength of the thigh and leg muscles and to improve motion. Often times heat used for twenty to thirty minutes before working out will loosen up your tissues and help with improving the range of motion but do not use heat for the first two weeks following surgery (sometimes heat can increase post-operative swelling).   These exercises can be done on a training (exercise) mat, on the floor, on a table or on a bed. Use whatever works the best and is most comfortable for you.  Use music or television while you are exercising so that the exercises are a pleasant break in your day. This will make your life better with the exercises acting as a break in your routine that you can look forward to.   Perform all exercises about fifteen times, three times per day or as directed.  You should exercise both the operative leg and the other leg as well.  Exercises include:   Quad Sets - Tighten up the muscle on the front of the thigh (Quad) and hold for 5-10  seconds.   Straight Leg Raises - With your knee straight (if you were given a brace, keep it on), lift the leg to 60 degrees, hold for 3 seconds, and slowly lower the leg.  Perform this exercise against resistance later as your leg gets stronger.  Leg Slides: Lying on your back, slowly slide your foot toward your buttocks, bending your knee up off the floor (only go as far as is comfortable). Then slowly slide your foot back down until your leg is flat on the floor again.  Angel Wings: Lying on your back spread your legs to the side as far apart as you can without causing discomfort.  Hamstring Strength:  Lying on your back, push your heel against the floor with your leg straight by tightening up the muscles of your buttocks.  Repeat, but this time bend your knee to a comfortable angle, and push your heel against the floor.  You may put a pillow under the heel to make it more comfortable if necessary.   A rehabilitation program following joint replacement surgery can speed recovery and prevent re-injury in the future due to weakened muscles. Contact your doctor or a physical therapist for more information on knee rehabilitation.    CONSTIPATION  Constipation is defined medically as fewer than three stools per week and severe constipation as less than one stool per week.  Even if you have a regular bowel pattern at home, your normal regimen is likely to be disrupted due to multiple reasons following surgery.  Combination of anesthesia, postoperative narcotics, change in appetite and fluid intake all can affect your bowels.   YOU MUST use at least one of the following options; they are listed in order of increasing strength to get the job done.  They are all available over the counter, and you may need to use some, POSSIBLY even all of these options:    Drink plenty of fluids (prune juice may be helpful) and high fiber foods Colace 100 mg by mouth twice a day  Senokot for constipation as directed and  as needed Dulcolax (bisacodyl), take with full glass of water  Miralax (polyethylene glycol) once or twice a day as needed.  If you have tried all these things and are unable to have a bowel movement in the first 3-4 days after surgery call either your surgeon or your primary doctor.    If you experience loose stools or diarrhea, hold the medications until you stool forms back up.  If your symptoms do not get better within 1 week or if they get worse, check with your doctor.  If you experience "the worst abdominal pain ever" or develop nausea or vomiting, please contact the office immediately for further recommendations for treatment.   ITCHING:  If you experience itching with your medications, try taking only a single pain pill, or even half a pain pill at a time.  You can also use Benadryl over the counter  for itching or also to help with sleep.   TED HOSE STOCKINGS:  Use stockings on both legs until for at least 2 weeks or as directed by physician office. They may be removed at night for sleeping.  MEDICATIONS:  See your medication summary on the "After Visit Summary" that nursing will review with you.  You may have some home medications which will be placed on hold until you complete the course of blood thinner medication.  It is important for you to complete the blood thinner medication as prescribed.  Take medicines as prescribed.   You have several different medicines that work in different ways. - Tylenol is for mild to moderate pain. Try to take this medicine before turning to your narcotic medicines.  - Meloxicam is to reduce pain / inflammation - Robaxin is for muscle spasms. This medicine can make you drowsy. - Oxycodone is a narcotic pain medicine.  Take this for severe pain. This medicine can be dehydrating / constipating. - Zofran is for nausea and vomiting. - Aspirin is to prevent blood clots after surgery.  - Senokot for constipation take as directed  PRECAUTIONS:  If you  experience chest pain or shortness of breath - call 911 immediately for transfer to the hospital emergency department.   If you develop a fever greater that 101 F, purulent drainage from wound, increased redness or drainage from wound, foul odor from the wound/dressing, or calf pain - CONTACT YOUR SURGEON.                                                   FOLLOW-UP APPOINTMENTS:  If you do not already have a post-op appointment, please call the office [phone] for an appointment to be seen by Dr. Eulah Pont in 2 weeks.   OTHER INSTRUCTIONS:   MAKE SURE YOU:  Understand these instructions.  Get help right away if you are not doing well or get worse.    Thank you for letting us be a part of your medical care team.  It is a privilege we respect greatly.  We hope these instructions will help you stay on track for a fast and full recovery!

## 2023-02-14 NOTE — Anesthesia Postprocedure Evaluation (Signed)
Anesthesia Post Note  Patient: Ruthella Jaquith  Procedure(s) Performed: TOTAL HIP ARTHROPLASTY ANTERIOR APPROACH (Right: Hip)     Patient location during evaluation: PACU Anesthesia Type: MAC and Spinal Level of consciousness: oriented and awake and alert Pain management: pain level controlled Vital Signs Assessment: post-procedure vital signs reviewed and stable Respiratory status: spontaneous breathing, respiratory function stable and patient connected to nasal cannula oxygen Cardiovascular status: blood pressure returned to baseline and stable Postop Assessment: no headache, no backache and no apparent nausea or vomiting Anesthetic complications: no   No notable events documented.  Last Vitals:  Vitals:   02/14/23 1330 02/14/23 1400  BP: (!) 176/100 (!) 158/78  Pulse: 60 69  Resp:    Temp:    SpO2: 100% 100%    Last Pain:  Vitals:   02/14/23 1330  TempSrc:   PainSc: 5                  Shavana Calder

## 2023-02-14 NOTE — Anesthesia Procedure Notes (Signed)
Spinal  Patient location during procedure: OR Start time: 02/14/2023 8:25 AM End time: 02/14/2023 8:28 AM Reason for block: surgical anesthesia Staffing Performed: resident/CRNA  Resident/CRNA: Florene Route, CRNA Performed by: Florene Route, CRNA Authorized by: Bethena Midget, MD   Preanesthetic Checklist Completed: patient identified, IV checked, site marked, risks and benefits discussed, surgical consent, monitors and equipment checked, pre-op evaluation and timeout performed Spinal Block Patient position: sitting Prep: DuraPrep and site prepped and draped Patient monitoring: heart rate, cardiac monitor, continuous pulse ox and blood pressure Approach: midline Location: L4-5 Injection technique: single-shot Needle Needle type: Pencan  Needle gauge: 24 G Needle length: 10 cm Assessment Sensory level: T4 Events: CSF return Additional Notes Kit expiration date 12/31/2024 and lot # [id number] Clear free flow of CSF, negative heme, negative paresthesia Tolerated well and returned to supine position

## 2023-02-14 NOTE — Op Note (Signed)
02/14/2023  10:05 AM  PATIENT:  Renee Cross   MRN: [id number]  PRE-OPERATIVE DIAGNOSIS:  OA RIGHT HIP  POST-OPERATIVE DIAGNOSIS:  OA RIGHT HIP  PROCEDURE:  Procedure(s): TOTAL HIP ARTHROPLASTY ANTERIOR APPROACH  PREOPERATIVE INDICATIONS:    [name] is an 47 y.o. female who has a diagnosis of <principal problem not specified> and elected for surgical management after failing conservative treatment.  The risks benefits and alternatives were discussed with the patient including but not limited to the risks of nonoperative treatment, versus surgical intervention including infection, bleeding, nerve injury, periprosthetic fracture, the need for revision surgery, dislocation, leg length discrepancy, blood clots, cardiopulmonary complications, morbidity, mortality, among others, and they were willing to proceed.     OPERATIVE REPORT     SURGEON:   Sheral Apley, MD    ASSISTANT:  Levester Fresh, PA-C, she was present and scrubbed throughout the case, critical for completion in a timely fashion, and for retraction, instrumentation, and closure.     ANESTHESIA:  General    COMPLICATIONS:  None.     COMPONENTS:  [employer]ker acolade fit femur size 5 with a 36 mm -5 head ball and an acetabular shell size 50 with a  polyethylene liner    PROCEDURE IN DETAIL:   The patient was met in the holding area and  identified.  The appropriate hip was identified and marked at the operative site.  The patient was then transported to the OR  and  placed under anesthesia per that record.  At that point, the patient was  placed in the supine position and  secured to the operating room table and all bony prominences padded. He received pre-operative antibiotics    The operative lower extremity was prepped from the iliac crest to the distal leg.  Sterile draping was performed.  Time out was performed prior to incision.      Skin incision was made just 2 cm lateral to the ASIS  extending in line with the  tensor fascia lata. Electrocautery was used to control all bleeders. I dissected down sharply to the fascia of the tensor fascia lata was confirmed that the muscle fibers beneath were running posteriorly. I then incised the fascia over the superficial tensor fascia lata in line with the incision. The fascia was elevated off the anterior aspect of the muscle the muscle was retracted posteriorly and protected throughout the case. I then used electrocautery to incise the tensor fascia lata fascia control and all bleeders. Immediately visible was the fat over top of the anterior neck and capsule.  I removed the anterior fat from the capsule and elevated the rectus muscle off of the anterior capsule. I then removed a large time of capsule. The retractors were then placed over the anterior acetabulum as well as around the superior and inferior neck.  I then made a femoral neck cut. Then used the power corkscrew to remove the femoral head from the acetabulum and thoroughly irrigated the acetabulum. I sized the femoral head.    I then exposed the deep acetabulum, cleared out any tissue including the ligamentum teres.   After adequate visualization, I excised the labrum, and then sequentially reamed.  I then impacted the acetabular implant into place using fluoroscopy for guidance.  Appropriate version and inclination was confirmed clinically matching their bony anatomy, and with fluoroscopy.  I placed a 20 mm screw in the posterior/superio position with an excellent bite.    I then placed the polyethylene liner in place  I then adducted the leg and released the external rotators from the posterior femur allowing it to be easily delivered up lateral and anterior to the acetabulum for preparation of the femoral canal.    I then prepared the proximal femur using the cookie-cutter and then sequentially reamed and broached.  A trial broach, neck, and head was utilized, and I reduced the hip and used floroscopy to  assess the neck length and femoral implant.  I then impacted the femoral prosthesis into place into the appropriate version. The hip was then reduced and fluoroscopy confirmed appropriate position. Leg lengths were restored.  I then irrigated the hip copiously again with, and repaired the fascia with Vicryl, followed by monocryl for the subcutaneous tissue, Monocryl for the skin, Steri-Strips and sterile gauze. The patient was then awakened and returned to PACU in stable and satisfactory condition. There were no complications.  POST OPERATIVE PLAN: WBAT, DVT px: SCD's/TED, ambulation and chemical dvt px  [name], MD Orthopedic Surgeon [phone]

## 2023-02-14 NOTE — Interval H&P Note (Signed)
History and Physical Interval Note:  02/14/2023 7:20 AM  Renee Cross  has presented today for surgery, with the diagnosis of OA RIGHT HIP.  The various methods of treatment have been discussed with the patient and family. After consideration of risks, benefits and other options for treatment, the patient has consented to  Procedure(s): TOTAL HIP ARTHROPLASTY ANTERIOR APPROACH (Right) as a surgical intervention.  The patient's history has been reviewed, patient examined, no change in status, stable for surgery.  I have reviewed the patient's chart and labs.  Questions were answered to the patient's satisfaction.     Sheral Apley

## 2023-02-14 NOTE — Evaluation (Signed)
Physical Therapy Evaluation Patient Details Name: Renee Cross MRN: [id number] DOB: 03/30/1976 Today's Date: 02/14/2023  History of Present Illness  47 yo female presents to therapy s/p R THA, anterior approach on 02/14/2023 due to failure of conservative measures. Pt PMH includes but is not limited to: anemia, asthma, GERD, HTN and sickle cell trait.  Clinical Impression    Renee Cross is a 47 y.o. female POD 0 s/p R THA, AA. Patient reports IND with mobility at baseline. Patient is now limited by functional impairments (see PT problem list below) and requires min guard and cues for transfers and gait with RW. Patient was able to ambulate 45 and 40 feet with RW and min guard and cues for safe walker management and more normalized gait pattern. Patient educated on safe sequencing for stair mobility with use of RW, car transfers, fall risk prevention, use of RW at all times for safety and stability, recommendations for shower chair and hand held shower head, pain management and use of CP/Ice pt and daughter verbalized understanding of safe guarding position for people assisting with mobility. Patient instructed in exercises to facilitate ROM and circulation reviewed with HO provided. Patient will benefit from continued skilled PT interventions to address impairments and progress towards PLOF. Patient has met mobility goals at adequate level for discharge home with family support and OPPT 5/20; will continue to follow if pt continues acute stay to progress towards Mod I goals.      Recommendations for follow up therapy are one component of a multi-disciplinary discharge planning process, led by the attending physician.  Recommendations may be updated based on patient status, additional functional criteria and insurance authorization.  Follow Up Recommendations       Assistance Recommended at Discharge Intermittent Supervision/Assistance  Patient can return home with the following  A little help  with walking and/or transfers;A little help with bathing/dressing/bathroom;Assistance with cooking/housework;Assist for transportation;Help with stairs or ramp for entrance    Equipment Recommendations Rolling walker (2 wheels) (provided and adjusted at eval)  Recommendations for Other Services       Functional Status Assessment Patient has had a recent decline in their functional status and demonstrates the ability to make significant improvements in function in a reasonable and predictable amount of time.     Precautions / Restrictions Precautions Precautions: Fall Restrictions Weight Bearing Restrictions: No      Mobility  Bed Mobility Overal bed mobility: Needs Assistance Bed Mobility: Supine to Sit     Supine to sit: Min guard     General bed mobility comments: cues    Transfers Overall transfer level: Needs assistance Equipment used: Rolling walker (2 wheels) Transfers: Sit to/from Stand Sit to Stand: Min guard           General transfer comment: cues for proper UE placement with bed, recliner and commode transfers    Ambulation/Gait Ambulation/Gait assistance: Min guard Gait Distance (Feet): 45 Feet Assistive device: Rolling walker (2 wheels) Gait Pattern/deviations: Step-to pattern, Antalgic Gait velocity: decreased     General Gait Details: pt limiting R knee and hip flexion with pt rising up on L toes to clear R LE in swing phase, pt will require continued therapy services to normalize gait pattern  Stairs            Wheelchair Mobility    Modified Rankin (Stroke Patients Only)       Balance Overall balance assessment: Needs assistance Sitting-balance support: Single extremity supported Sitting balance-Leahy Scale: Good  Standing balance support: Bilateral upper extremity supported, During functional activity, Reliant on assistive device for balance Standing balance-Leahy Scale: Poor                                Pertinent Vitals/Pain Pain Assessment Pain Assessment: 0-10 Pain Score: 6  Pain Location: R hip and knee Pain Descriptors / Indicators: Aching, Burning, Constant, Discomfort, Operative site guarding Pain Intervention(s): Limited activity within patient's tolerance, Monitored during session, Premedicated before session, Repositioned, Ice applied    Home Living Family/patient expects to be discharged to:: Private residence Living Arrangements: Children Available Help at Discharge: Family Type of Home: House Home Access: Stairs to enter Entrance Stairs-Rails: None Secretary/administrator of Steps: 1   Home Layout: One level Home Equipment: Cane - single point      Prior Function Prior Level of Function : Independent/Modified Independent;Driving             Mobility Comments: IND with ADLs, self care tasks, IADLs,       Hand Dominance        Extremity/Trunk Assessment        Lower Extremity Assessment Lower Extremity Assessment: RLE deficits/detail RLE Deficits / Details: ankle DF/PF 5/5 RLE Sensation: WNL    Cervical / Trunk Assessment Cervical / Trunk Assessment: Normal  Communication   Communication: No difficulties  Cognition Arousal/Alertness: Awake/alert Behavior During Therapy: WFL for tasks assessed/performed Overall Cognitive Status: Within Functional Limits for tasks assessed                                          General Comments      Exercises Total Joint Exercises Ankle Circles/Pumps: AROM, Both, 20 reps Quad Sets: AROM, Right, 5 reps Heel Slides: AROM, Right, 5 reps Hip ABduction/ADduction: Right, 5 reps, AAROM Long Arc Quad: AROM, Right, 5 reps Knee Flexion: AROM, Right, 5 reps, Standing Marching in Standing: AROM, Right, 5 reps Standing Hip Extension: AROM, Right, 5 reps   Assessment/Plan    PT Assessment Patient needs continued PT services  PT Problem List Decreased strength;Decreased range of  motion;Decreased activity tolerance;Decreased balance;Decreased mobility;Decreased coordination;Pain       PT Treatment Interventions DME instruction;Gait training;Stair training;Functional mobility training;Therapeutic activities;Therapeutic exercise;Balance training;Neuromuscular re-education;Patient/family education;Modalities    PT Goals (Current goals can be found in the Care Plan section)  Acute Rehab PT Goals Patient Stated Goal: to be able to return to work PT Goal Formulation: With patient Time For Goal Achievement: 02/28/23 Potential to Achieve Goals: Good    Frequency 7X/week     Co-evaluation               AM-PAC PT "6 Clicks" Mobility  Outcome Measure Help needed turning from your back to your side while in a flat bed without using bedrails?: A Little Help needed moving from lying on your back to sitting on the side of a flat bed without using bedrails?: A Little Help needed moving to and from a bed to a chair (including a wheelchair)?: A Little Help needed standing up from a chair using your arms (e.g., wheelchair or bedside chair)?: A Little Help needed to walk in hospital room?: A Little Help needed climbing 3-5 steps with a railing? : A Little 6 Click Score: 18    End of Session Equipment Utilized During Treatment: Gait belt  Activity Tolerance: Patient tolerated treatment well Patient left: in chair;with call bell/phone within reach;with family/visitor present Nurse Communication: Mobility status;Other (comment);Patient requests pain meds (pt rediness for d/c home) PT Visit Diagnosis: Unsteadiness on feet (R26.81);Other abnormalities of gait and mobility (R26.89);Muscle weakness (generalized) (M62.81);Difficulty in walking, not elsewhere classified (R26.2);Pain Pain - Right/Left: Right Pain - part of body: Knee    Time: 1610-9604 PT Time Calculation (min) (ACUTE ONLY): 51 min   Charges:   PT Evaluation $PT Eval Low Complexity: 1 Low PT  Treatments $Gait Training: 8-22 mins $Therapeutic Exercise: 8-22 mins        Johnny Bridge, PT Acute Rehab   Jacqualyn Posey 02/14/2023, 4:46 PM

## 2023-02-17 ENCOUNTER — Encounter (HOSPITAL_COMMUNITY): Payer: Self-pay | Admitting: Orthopedic Surgery

## 2023-02-20 DIAGNOSIS — M1611 Unilateral primary osteoarthritis, right hip: Secondary | ICD-10-CM | POA: Diagnosis not present

## 2023-02-21 ENCOUNTER — Encounter: Payer: Self-pay | Admitting: Allergy and Immunology

## 2023-02-21 ENCOUNTER — Other Ambulatory Visit: Payer: Self-pay

## 2023-02-21 ENCOUNTER — Ambulatory Visit (INDEPENDENT_AMBULATORY_CARE_PROVIDER_SITE_OTHER): Payer: 59 | Admitting: Allergy and Immunology

## 2023-02-21 VITALS — BP 126/70 | HR 56 | Temp 98.6°F | Resp 18

## 2023-02-21 DIAGNOSIS — L2089 Other atopic dermatitis: Secondary | ICD-10-CM | POA: Diagnosis not present

## 2023-02-21 DIAGNOSIS — J4550 Severe persistent asthma, uncomplicated: Secondary | ICD-10-CM | POA: Diagnosis not present

## 2023-02-21 DIAGNOSIS — J3089 Other allergic rhinitis: Secondary | ICD-10-CM | POA: Diagnosis not present

## 2023-02-21 DIAGNOSIS — Z8709 Personal history of other diseases of the respiratory system: Secondary | ICD-10-CM

## 2023-02-21 DIAGNOSIS — J301 Allergic rhinitis due to pollen: Secondary | ICD-10-CM | POA: Diagnosis not present

## 2023-02-21 DIAGNOSIS — K219 Gastro-esophageal reflux disease without esophagitis: Secondary | ICD-10-CM

## 2023-02-21 MED ORDER — FLUTICASONE PROPIONATE 50 MCG/ACT NA SUSP: NASAL | 3 refills | Status: DC

## 2023-02-21 MED ORDER — ALBUTEROL SULFATE HFA 108 (90 BASE) MCG/ACT IN AERS: INHALATION_SPRAY | RESPIRATORY_TRACT | 1 refills | Status: DC

## 2023-02-21 MED ORDER — OMEPRAZOLE 40 MG PO CPDR: 40.0000 mg | DELAYED_RELEASE_CAPSULE | Freq: Every morning | ORAL | 1 refills | Status: DC

## 2023-02-21 MED ORDER — OLOPATADINE HCL 0.2 % OP SOLN: 1.0000 [drp] | Freq: Every day | OPHTHALMIC | 3 refills | Status: DC | PRN

## 2023-02-21 MED ORDER — LORATADINE 10 MG PO TABS: ORAL_TABLET | ORAL | 3 refills | Status: DC

## 2023-02-21 MED ORDER — BREZTRI AEROSPHERE 160-9-4.8 MCG/ACT IN AERO: 2.0000 | INHALATION_SPRAY | Freq: Two times a day (BID) | RESPIRATORY_TRACT | 3 refills | Status: DC

## 2023-02-21 NOTE — Progress Notes (Signed)
[place] [facility] [place] [facility] [place] [facility] [place] -    Follow-up Note  Referring Provider: Garnette Gunner, MD Primary Provider: Garnette Gunner, MD Date of Office Visit: 02/21/2023  Subjective:   Renee Cross (DOB: 03-18-1976) is a 47 y.o. female who [facility] in re-evaluation of the following:  HPI: Bernadina returns to this clinic in evaluation of asthma, allergic rhinitis, atopic dermatitis, history of nasal polyposis, reflux.  I last saw her in this clinic 13 December 2022.  She still continues to have issues with wheezing / coughing and her bronchodilator requirement is over twice a week while she continues on Trelegy.  Unfortunately, she does have some marijuana smoke exposure about 5 times per day.  She is presently using some systemic steroids for her total hip replacement which occurred 1 week ago.  When she inhales her Trelegy she does get what sounds like some degree of laryngeal spasm.  Her nose is doing pretty well at this point.  Her skin is doing a lot better while using topical mometasone a few times a week.  Her reflux has been active this week because she is using hydrocodone and she has constipation and she just feels as though she is a little backed up.  Allergies as of 02/21/2023   No Known Allergies      Medication List    acetaminophen 500 MG tablet Commonly known as: TYLENOL Take 2 tablets (1,000 mg total) by mouth every 8 (eight) hours for 14 days.   albuterol (2.5 MG/3ML) 0.083% nebulizer solution Commonly known as: PROVENTIL Take 3 mLs (2.5 mg total) by nebulization every 4 (four) hours as needed for wheezing or shortness of breath.   albuterol 108 (90 Base) MCG/ACT inhaler Commonly known as: VENTOLIN HFA 2 puffs every 4-6 hours as needed for cough or wheeze.   aspirin 81 MG chewable tablet Commonly known as: Aspirin Childrens Chew 1 tablet (81 mg total) by mouth 2 (two) times daily. For 4  weeks for DVT prophylaxis after surgery   cetirizine 10 MG tablet Commonly known as: ZYRTEC Take 1 tablet (10 mg total) by mouth daily. 1 po q day prn allergies   cyanocobalamin 1000 MCG tablet Commonly known as: VITAMIN B12 Take 1 tablet (1,000 mcg total) by mouth daily.   DULoxetine 20 MG capsule Commonly known as: Cymbalta Take 1 capsule (20 mg total) by mouth daily.   ferrous sulfate 325 (65 FE) MG EC tablet Take 325 mg by mouth daily.   fluticasone 50 MCG/ACT nasal spray Commonly known as: FLONASE 2 sprays per nostril every day for stuffy nose or drainage.   loratadine 10 MG tablet Commonly known as: Claritin Take 1 tablet  1-2 times per day   meloxicam 15 MG tablet Commonly known as: MOBIC Take 1 tablet (15 mg total) by mouth daily. For 2 weeks for pain and inflammation. Then take as needed   methocarbamol 500 MG tablet Commonly known as: ROBAXIN Take 1 tablet (500 mg total) by mouth every 8 (eight) hours as needed for muscle spasms.   mometasone 0.1 % ointment Commonly known as: ELOCON Apply topically daily. Apply once daily to red itchy areas or until resolved   multivitamin with minerals Tabs tablet Take 1 tablet by mouth daily.   Olopatadine HCl 0.2 % Soln Commonly known as: Pataday Place 1 drop into both eyes daily.   omeprazole 40 MG capsule Commonly known as: PRILOSEC Take 1 capsule (40 mg  total) by mouth in the morning.   ondansetron 4 MG tablet Commonly known as: Zofran Take 1 tablet (4 mg total) by mouth every 8 (eight) hours as needed for up to 7 days for nausea or vomiting.   predniSONE 20 MG tablet Commonly known as: DELTASONE Take 20 mg by mouth in the morning and at bedtime.   sennosides-docusate sodium 8.6-50 MG tablet Commonly known as: SENOKOT-S Take 2 tablets by mouth daily. To help prevent constipation while taking pain medication   Trelegy Ellipta 200-62.5-25 MCG/ACT Aepb Generic drug: Fluticasone-Umeclidin-Vilant Inhale 1 puff  into the lungs in the morning.   Trelegy Ellipta 200-62.5-25 MCG/ACT Aepb Generic drug: Fluticasone-Umeclidin-Vilant Take 1 puff once daily to prevent coughing or wheezing.    Past Medical History:  Diagnosis Date   Anemia    Arthritis    Asthma    Depression    GERD (gastroesophageal reflux disease)    Hiatal hernia with GERD    Hypertension    Pneumonia    PTSD (post-traumatic stress disorder)    Sickle cell trait (HCC)     Past Surgical History:  Procedure Laterality Date   HERNIA REPAIR     SINUS EXPLORATION     STOMACH SURGERY  10/03/2001   Possible fundiplication?   TONSILLECTOMY     TOTAL HIP ARTHROPLASTY Right 02/14/2023   Procedure: TOTAL HIP ARTHROPLASTY ANTERIOR APPROACH;  Surgeon: Sheral Apley, MD;  Location: [facility];  Service: Orthopedics;  Laterality: Right;   TUBAL LIGATION      Review of systems negative except as noted in HPI / PMHx or noted below:  Review of Systems  Constitutional: Negative.   HENT: Negative.    Eyes: Negative.   Respiratory: Negative.    Cardiovascular: Negative.   Gastrointestinal: Negative.   Genitourinary: Negative.   Musculoskeletal: Negative.   Skin: Negative.   Neurological: Negative.   Endo/Heme/Allergies: Negative.   Psychiatric/Behavioral: Negative.       Objective:   Vitals:   02/21/23 1150  BP: 126/70  Pulse: (!) 56  Resp: 18  Temp: 98.6 F (37 C)  SpO2: 98%          Physical Exam Constitutional:      Appearance: She is not diaphoretic.  HENT:     Head: Normocephalic.     Right Ear: Tympanic membrane, ear canal and external ear normal.     Left Ear: Tympanic membrane, ear canal and external ear normal.     Nose: Nose normal. No mucosal edema or rhinorrhea.     Mouth/Throat:     Pharynx: Uvula midline. No oropharyngeal exudate.  Eyes:     Conjunctiva/sclera: Conjunctivae normal.  Neck:     Thyroid: No thyromegaly.     Trachea: Trachea normal. No tracheal tenderness or tracheal deviation.   Cardiovascular:     Rate and Rhythm: Normal rate and regular rhythm.     Heart sounds: Normal heart sounds, S1 normal and S2 normal. No murmur heard. Pulmonary:     Effort: No respiratory distress.     Breath sounds: Normal breath sounds. No stridor. No wheezing or rales.  Lymphadenopathy:     Head:     Right side of head: No tonsillar adenopathy.     Left side of head: No tonsillar adenopathy.     Cervical: No cervical adenopathy.  Skin:    Findings: No erythema or rash.     Nails: There is no clubbing.  Neurological:     Mental Status: She  is alert.     Diagnostics:    Spirometry was performed and demonstrated an FEV1 of 1.56 at 60 % of predicted.  The patient had an Asthma Control Test with the following results: ACT Total Score: 17.    Assessment and Plan:   1. Not well controlled severe persistent asthma   2. Perennial allergic rhinitis   3. Seasonal allergic rhinitis due to pollen   4. Other atopic dermatitis   5. History of nasal polyposis   6. Gastroesophageal reflux disease, unspecified whether esophagitis present     1.  Allergen avoidance measures -dust, mold, pollens  2.  Treat and prevent inflammation:  A. Breztri - 2 inhalations 2 time per day with spacer (empty lungs) B. Flonase - 2 spray each nostril 1 time per day C. Shower followed by mometasone 0.1% ointment daily until resolved D. Will investigate for tezepelumab injections with insurance  3. Treat and prevent reflux:  A. Omeprazole 40 mg - 1 tablet 1 time per day  4. If needed:   A. Albuterol HFA - 2 inhalations every 4-6 hours B. Loratadine 10 mg - 1 tablet 1-2 times per day  5. Return to clinic in 12 weeks or earlier if problem  Shaiann still has evidence of active disease in her airway and will start her on tezepelumab injections once we get insurance approval.  She is intolerant of using the powder formulation of a triple inhaler and we will give her an HFA formulation of a triple  inhaler.  Although her reflux is somewhat active at this point that is probably because of the hydrocodone she has been using and hopefully that will resolve when she stops her hydrocodone next week.  I did have a talk with her today about possibly using a oral form of marijuana rather than smoking marijuana as there is no doubt that she is going to get some degree of respiratory tract irritation from that smoke exposure.  Will see her in this clinic in 12 weeks or earlier if there is a problem.  Laurette Schimke, MD Allergy / Immunology [facility]

## 2023-02-21 NOTE — Patient Instructions (Addendum)
  1.  Allergen avoidance measures -dust, mold, pollens  2.  Treat and prevent inflammation:  A. Breztri - 2 inhalations 2 time per day with spacer (empty lungs) B. Flonase - 2 spray each nostril 1 time per day C. Shower followed by mometasone 0.1% ointment daily until resolved D. Will investigate for tezepelumab injections with insurance  3. Treat and prevent reflux:  A. Omeprazole 40 mg - 1 tablet 1 time per day  4. If needed:   A. Albuterol HFA - 2 inhalations every 4-6 hours B. Loratadine 10 mg - 1 tablet 1-2 times per day  5. Return to clinic in 12 weeks or earlier if problem

## 2023-02-22 ENCOUNTER — Telehealth (HOSPITAL_BASED_OUTPATIENT_CLINIC_OR_DEPARTMENT_OTHER): Payer: 59 | Admitting: Psychiatry

## 2023-02-22 ENCOUNTER — Encounter: Payer: Self-pay | Admitting: Allergy and Immunology

## 2023-02-22 ENCOUNTER — Encounter (HOSPITAL_COMMUNITY): Payer: Self-pay | Admitting: Psychiatry

## 2023-02-22 VITALS — Wt 183.0 lb

## 2023-02-22 DIAGNOSIS — F4312 Post-traumatic stress disorder, chronic: Secondary | ICD-10-CM | POA: Diagnosis not present

## 2023-02-22 DIAGNOSIS — F331 Major depressive disorder, recurrent, moderate: Secondary | ICD-10-CM

## 2023-02-22 DIAGNOSIS — F1010 Alcohol abuse, uncomplicated: Secondary | ICD-10-CM | POA: Diagnosis not present

## 2023-02-22 DIAGNOSIS — F1210 Cannabis abuse, uncomplicated: Secondary | ICD-10-CM

## 2023-02-22 MED ORDER — DULOXETINE HCL 30 MG PO CPEP
30.0000 mg | ORAL_CAPSULE | Freq: Every day | ORAL | 0 refills | Status: DC
Start: 2023-02-22 — End: 2023-06-27
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
## 2023-02-27 ENCOUNTER — Other Ambulatory Visit (HOSPITAL_COMMUNITY): Payer: Self-pay | Admitting: Psychiatry

## 2023-02-27 DIAGNOSIS — F331 Major depressive disorder, recurrent, moderate: Secondary | ICD-10-CM

## 2023-02-27 DIAGNOSIS — F1010 Alcohol abuse, uncomplicated: Secondary | ICD-10-CM

## 2023-02-27 DIAGNOSIS — F4312 Post-traumatic stress disorder, chronic: Secondary | ICD-10-CM

## 2023-02-27 DIAGNOSIS — F1210 Cannabis abuse, uncomplicated: Secondary | ICD-10-CM

## 2023-03-01 ENCOUNTER — Encounter (HOSPITAL_COMMUNITY): Payer: Self-pay | Admitting: Orthopedic Surgery

## 2023-03-01 DIAGNOSIS — M1611 Unilateral primary osteoarthritis, right hip: Secondary | ICD-10-CM | POA: Diagnosis not present

## 2023-03-02 DIAGNOSIS — M1611 Unilateral primary osteoarthritis, right hip: Secondary | ICD-10-CM | POA: Diagnosis not present

## 2023-03-07 DIAGNOSIS — M1611 Unilateral primary osteoarthritis, right hip: Secondary | ICD-10-CM | POA: Diagnosis not present

## 2023-03-09 DIAGNOSIS — M1611 Unilateral primary osteoarthritis, right hip: Secondary | ICD-10-CM | POA: Diagnosis not present

## 2023-03-13 ENCOUNTER — Telehealth: Payer: Self-pay | Admitting: *Deleted

## 2023-03-13 NOTE — Telephone Encounter (Signed)
L/m for patient to contact me to advise Renee Cross is excluded from patient Ins coverage and has to try and fail Nucala first for any other biologic for asthma.

## 2023-03-13 NOTE — Telephone Encounter (Signed)
-----   Message from [name], MD sent at 02/22/2023  6:34 AM EDT ----- Renee Cross for asthma

## 2023-03-14 DIAGNOSIS — M1611 Unilateral primary osteoarthritis, right hip: Secondary | ICD-10-CM | POA: Diagnosis not present

## 2023-03-14 NOTE — Telephone Encounter (Signed)
Patient had called back, I reached back out and l/m

## 2023-03-14 NOTE — Telephone Encounter (Signed)
Spoke to patient and she wants to get injs in clinic will submit and reach out once delivery set to make appt to start Nucala

## 2023-03-16 DIAGNOSIS — M1611 Unilateral primary osteoarthritis, right hip: Secondary | ICD-10-CM | POA: Diagnosis not present

## 2023-03-16 DIAGNOSIS — Z124 Encounter for screening for malignant neoplasm of cervix: Secondary | ICD-10-CM | POA: Diagnosis not present

## 2023-03-16 DIAGNOSIS — Z01419 Encounter for gynecological examination (general) (routine) without abnormal findings: Secondary | ICD-10-CM | POA: Diagnosis not present

## 2023-03-16 DIAGNOSIS — Z1231 Encounter for screening mammogram for malignant neoplasm of breast: Secondary | ICD-10-CM | POA: Diagnosis not present

## 2023-03-16 DIAGNOSIS — D500 Iron deficiency anemia secondary to blood loss (chronic): Secondary | ICD-10-CM | POA: Diagnosis not present

## 2023-03-16 DIAGNOSIS — N920 Excessive and frequent menstruation with regular cycle: Secondary | ICD-10-CM | POA: Diagnosis not present

## 2023-03-22 DIAGNOSIS — M1611 Unilateral primary osteoarthritis, right hip: Secondary | ICD-10-CM | POA: Diagnosis not present

## 2023-03-24 ENCOUNTER — Telehealth (HOSPITAL_BASED_OUTPATIENT_CLINIC_OR_DEPARTMENT_OTHER): Payer: Self-pay | Admitting: Psychiatry

## 2023-03-24 ENCOUNTER — Encounter (HOSPITAL_COMMUNITY): Payer: Self-pay

## 2023-03-24 DIAGNOSIS — Z91199 Patient's noncompliance with other medical treatment and regimen due to unspecified reason: Secondary | ICD-10-CM

## 2023-03-24 NOTE — Progress Notes (Signed)
No show

## 2023-03-27 DIAGNOSIS — M1611 Unilateral primary osteoarthritis, right hip: Secondary | ICD-10-CM | POA: Diagnosis not present

## 2023-03-28 DIAGNOSIS — D509 Iron deficiency anemia, unspecified: Secondary | ICD-10-CM | POA: Diagnosis not present

## 2023-03-28 DIAGNOSIS — K219 Gastro-esophageal reflux disease without esophagitis: Secondary | ICD-10-CM | POA: Diagnosis not present

## 2023-03-28 DIAGNOSIS — Z1211 Encounter for screening for malignant neoplasm of colon: Secondary | ICD-10-CM | POA: Diagnosis not present

## 2023-03-29 DIAGNOSIS — M1611 Unilateral primary osteoarthritis, right hip: Secondary | ICD-10-CM | POA: Diagnosis not present

## 2023-04-03 DIAGNOSIS — N939 Abnormal uterine and vaginal bleeding, unspecified: Secondary | ICD-10-CM | POA: Diagnosis not present

## 2023-04-03 DIAGNOSIS — Z3202 Encounter for pregnancy test, result negative: Secondary | ICD-10-CM | POA: Diagnosis not present

## 2023-04-03 DIAGNOSIS — N858 Other specified noninflammatory disorders of uterus: Secondary | ICD-10-CM | POA: Diagnosis not present

## 2023-04-03 DIAGNOSIS — Z975 Presence of (intrauterine) contraceptive device: Secondary | ICD-10-CM | POA: Diagnosis not present

## 2023-04-03 DIAGNOSIS — Z30430 Encounter for insertion of intrauterine contraceptive device: Secondary | ICD-10-CM | POA: Diagnosis not present

## 2023-04-03 DIAGNOSIS — A599 Trichomoniasis, unspecified: Secondary | ICD-10-CM | POA: Diagnosis not present

## 2023-04-05 ENCOUNTER — Telehealth: Payer: Self-pay | Admitting: *Deleted

## 2023-04-05 NOTE — Telephone Encounter (Signed)
L/m for patient to contact  [place] office for appt to start Nucala due to deliver 7/9

## 2023-04-13 ENCOUNTER — Telehealth: Payer: Self-pay | Admitting: Family Medicine

## 2023-04-13 ENCOUNTER — Ambulatory Visit: Payer: 59 | Admitting: Family Medicine

## 2023-04-13 NOTE — Telephone Encounter (Signed)
Pt was a no show for an OV with Dr Thompson on 04/13/23, I sent a no show letter.  

## 2023-04-26 NOTE — Telephone Encounter (Signed)
1st no show, fee waived, letter sent 

## 2023-05-08 ENCOUNTER — Telehealth (HOSPITAL_COMMUNITY): Payer: Self-pay | Admitting: Psychiatry

## 2023-05-16 ENCOUNTER — Encounter: Payer: Self-pay | Admitting: Family Medicine

## 2023-05-16 ENCOUNTER — Ambulatory Visit (INDEPENDENT_AMBULATORY_CARE_PROVIDER_SITE_OTHER): Payer: 59 | Admitting: Family Medicine

## 2023-05-16 ENCOUNTER — Ambulatory Visit: Payer: 59 | Admitting: Allergy and Immunology

## 2023-05-16 VITALS — BP 146/88 | HR 90 | Temp 97.6°F | Wt 191.8 lb

## 2023-05-16 DIAGNOSIS — R0981 Nasal congestion: Secondary | ICD-10-CM | POA: Diagnosis not present

## 2023-05-16 DIAGNOSIS — F17200 Nicotine dependence, unspecified, uncomplicated: Secondary | ICD-10-CM | POA: Insufficient documentation

## 2023-05-16 DIAGNOSIS — J4551 Severe persistent asthma with (acute) exacerbation: Secondary | ICD-10-CM | POA: Diagnosis not present

## 2023-05-16 DIAGNOSIS — I10 Essential (primary) hypertension: Secondary | ICD-10-CM

## 2023-05-16 DIAGNOSIS — J309 Allergic rhinitis, unspecified: Secondary | ICD-10-CM

## 2023-05-16 DIAGNOSIS — H66002 Acute suppurative otitis media without spontaneous rupture of ear drum, left ear: Secondary | ICD-10-CM | POA: Insufficient documentation

## 2023-05-16 HISTORY — DX: Nicotine dependence, unspecified, uncomplicated: F17.200

## 2023-05-16 HISTORY — DX: Acute suppurative otitis media without spontaneous rupture of ear drum, left ear: H66.002

## 2023-05-16 LAB — POCT INFLUENZA A/B
Influenza A, POC: NEGATIVE
Influenza B, POC: NEGATIVE

## 2023-05-16 LAB — POC COVID19 BINAXNOW: SARS Coronavirus 2 Ag: NEGATIVE

## 2023-05-16 MED ORDER — AMOXICILLIN-POT CLAVULANATE 875-125 MG PO TABS
1.0000 | ORAL_TABLET | Freq: Two times a day (BID) | ORAL | 0 refills | Status: DC
Start: 2023-05-16 — End: 2023-05-30

## 2023-05-16 MED ORDER — PREDNISONE 50 MG PO TABS
ORAL_TABLET | ORAL | 0 refills | Status: DC
Start: 2023-05-16 — End: 2023-05-30

## 2023-05-16 NOTE — Assessment & Plan Note (Signed)
The patient's asthma appears exacerbated likely due to a viral infection. Differential Diagnosis: Viral-induced asthma exacerbation, Bacterial superinfection. Plan: Prescribe Prednisone 50 mg daily for 5 days. Prescribe Augmentin 875 mg twice daily for 10 days for suspected ear infection. Continue using Trelegy daily and albuterol as needed. Advise the patient to reduce smoking (including black and mild) to minimize exacerbation triggers.

## 2023-05-16 NOTE — Assessment & Plan Note (Signed)
The patient has elevated blood pressure readings possibly secondary to current illness.  Plan: Recheck blood pressure home cuff calibration. Monitor blood pressure at home consistently. Postpone adjustments to blood pressure management until resolution of current illness. Follow up in two weeks for re-evaluation of blood pressure.

## 2023-05-16 NOTE — Progress Notes (Signed)
Assessment/Plan:   Problem List Items Addressed This Visit       Cardiovascular and Mediastinum   Primary hypertension    The patient has elevated blood pressure readings possibly secondary to current illness.  Plan: Recheck blood pressure home cuff calibration. Monitor blood pressure at home consistently. Postpone adjustments to blood pressure management until resolution of current illness. Follow up in two weeks for re-evaluation of blood pressure.        Respiratory   Severe persistent asthma with exacerbation    The patient's asthma appears exacerbated likely due to a viral infection. Differential Diagnosis: Viral-induced asthma exacerbation, Bacterial superinfection. Plan: Prescribe Prednisone 50 mg daily for 5 days. Prescribe Augmentin 875 mg twice daily for 10 days for suspected ear infection. Continue using Trelegy daily and albuterol as needed. Advise the patient to reduce smoking (including black and mild) to minimize exacerbation triggers.      Relevant Medications   predniSONE (DELTASONE) 50 MG tablet     Nervous and Auditory   Non-recurrent acute suppurative otitis media of left ear without spontaneous rupture of tympanic membrane   Relevant Medications   amoxicillin-clavulanate (AUGMENTIN) 875-125 MG tablet     Other   Tobacco use disorder   Other Visit Diagnoses     Nasal congestion    -  Primary   Relevant Orders   POC COVID-19 BinaxNow (Completed)   POCT Influenza A/B (Completed)       Medications Discontinued During This Encounter  Medication Reason   BREZTRI AEROSPHERE 160-9-4.8 MCG/ACT AERO    predniSONE (DELTASONE) 20 MG tablet     Return in about 2 weeks (around 05/30/2023) for Blood pressure.    Subjective:   Encounter date: 05/16/2023  Renee Cross is a 47 y.o. female who has Amblyopia of left eye; Anemia; Rheumatoid arthritis (HCC); Abnormal uterine bleeding (AUB); Severe persistent asthma with exacerbation; Hip arthritis;  Gastroesophageal reflux disease; Perennial allergic rhinitis with seasonal variation; Preoperative evaluation to rule out surgical contraindication; Overuse of medication; Primary hypertension; Alcohol use disorder; Financial difficulty; Episode of recurrent major depressive disorder (HCC); B12 deficiency; History of suicide attempt; Non-recurrent acute suppurative otitis media of left ear without spontaneous rupture of tympanic membrane; and Tobacco use disorder on their problem list..   She  has a past medical history of Anemia, Arthritis, Asthma, Depression, GERD (gastroesophageal reflux disease), Hiatal hernia with GERD, Hypertension, Pneumonia, PTSD (post-traumatic stress disorder), and Sickle cell trait (HCC)..   Chief Complaint: Follow-up for cough, congestion, and elevated blood pressure.  History of Present Illness:  Cough and Congestion: Renee Cross presents with complaints of a cough that started approximately two weeks ago and nasal congestion that began a few days ago. The nasal discharge is described as yellow and thick, with congestion noted in both the chest and nose. She reports mild chest pain and shortness of breath, primarily when coughing. The patient denies fever, chills, or recent contact with individuals having COVID or flu. She occasionally uses an albuterol inhaler a couple of times a day. Her history is significant for asthma treated with Trelegy and prior albuterol use.  Blood Pressure: The patient is concerned about her blood pressure management, with a history of elevated readings. At her last visit, recorded blood pressure values were variable, 90-160/80-100 mmHg including home and recent office visit at OB/GYN.  Review of Systems  Constitutional:  Negative for chills and fever.  HENT:  Positive for congestion.   Respiratory:  Positive for cough, shortness of breath and  wheezing.   All other systems reviewed and are negative.   Past Surgical History:  Procedure  Laterality Date   HERNIA REPAIR     SINUS EXPLORATION     STOMACH SURGERY  10/03/2001   Possible fundiplication?   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 75 mL 1   albuterol (VENTOLIN HFA) 108 (90 Base) MCG/ACT inhaler 2 puffs every 4-6 hours as needed for cough or wheeze. 18 g 1   cetirizine (ZYRTEC) 10 MG tablet Take 1 tablet (10 mg total) by mouth daily. 1 po q day prn allergies (Patient taking differently: Take 10 mg by mouth at bedtime.) 30 tablet 0   cyanocobalamin (VITAMIN B12) 1000 MCG tablet Take 1 tablet (1,000 mcg total) by mouth daily. 90 tablet 3   ferrous sulfate 325 (65 FE) MG EC tablet Take 325 mg by mouth daily.     fluticasone (FLONASE) 50 MCG/ACT nasal spray 2 sprays per nostril every day for stuffy nose or drainage. 16 g 3   Fluticasone-Umeclidin-Vilant (TRELEGY ELLIPTA) 200-62.5-25 MCG/ACT AEPB Inhale 1 puff into the lungs in the morning. 60 each 5   Fluticasone-Umeclidin-Vilant (TRELEGY ELLIPTA) 200-62.5-25 MCG/ACT AEPB Take 1 puff once daily to prevent coughing or wheezing. 5 each 1   loratadine (CLARITIN) 10 MG tablet Take 1 tablet  1-2 times per day 60 tablet 3   meloxicam (MOBIC) 15 MG tablet Take 1 tablet (15 mg total) by mouth daily. For 2 weeks for pain and inflammation. Then take as needed 30 tablet 0   methocarbamol (ROBAXIN) 500 MG tablet Take 1 tablet (500 mg total) by mouth every 8 (eight) hours as needed for muscle spasms. 30 tablet 0   mometasone (ELOCON) 0.1 % ointment Apply topically daily. Apply once daily to red itchy areas or  until resolved 45 g 3   Multiple Vitamin (MULTIVITAMIN WITH MINERALS) TABS tablet Take 1 tablet by mouth daily.     Olopatadine HCl (PATADAY) 0.2 % SOLN Place 1 drop into both eyes daily as needed (allergies). 2.5 mL 3   omeprazole (PRILOSEC) 40 MG capsule Take 1 capsule (40 mg total) by mouth in the morning. 90 capsule 1   predniSONE (DELTASONE) 20 MG tablet Take 20 mg by mouth in the morning and at bedtime.     DULoxetine (CYMBALTA) 30 MG capsule Take 1 capsule (30 mg total) by mouth daily. 30 capsule 0   sennosides-docusate sodium (SENOKOT-S) 8.6-50 MG tablet Take 2 tablets by mouth daily. To help prevent constipation while taking pain medication (Patient not taking: Reported on 05/16/2023) 30 tablet 0   BREZTRI AEROSPHERE 160-9-4.8 MCG/ACT AERO Inhale 2 puffs into the lungs in the morning and at bedtime. (Patient not taking: Reported on 05/16/2023) 10.7 g 3   No facility-administered medications prior to visit.    No family history on file.  Social History   Socioeconomic History   Marital status: Single    Spouse name: Not on file   Number of children: Not on file   Years of education: Not on file   Highest  education level: Not on file  Occupational History   Not on file  Tobacco Use   Smoking status: Some Days    Current packs/day: 0.00    Average packs/day: 1 pack/day for 32.0 years (32.0 ttl pk-yrs)    Types: Cigarettes    Start date: 55    Last attempt to quit: 2024    Years since quitting: 0.6    Passive exposure: Never   Smokeless tobacco: Current   Tobacco comments:    Smokes black and mild occassionally  Vaping Use   Vaping status: Never Used  Substance and Sexual Activity   Alcohol use: Yes    Alcohol/week: 2.0 standard drinks of alcohol    Types: 2 Cans of beer per week   Drug use: Yes    Frequency: 4.0 times per week    Types: Marijuana   Sexual activity: Yes    Birth control/protection: Surgical  Other Topics Concern   Not on file  Social History  Narrative   Not on file   Social Determinants of Health   Financial Resource Strain: Not on file  Food Insecurity: Not on file  Transportation Needs: Not on file  Physical Activity: Not on file  Stress: Not on file  Social Connections: Not on file  Intimate Partner Violence: Not on file                                                                                                  Objective:  Physical Exam: BP (!) 146/88 (BP Location: Left Arm, Patient Position: Sitting, Cuff Size: Large)   Pulse 90   Temp 97.6 F (36.4 C) (Temporal)   Wt 191 lb 12.8 oz (87 kg)   SpO2 100%   BMI 30.96 kg/m     Physical Exam Constitutional:      General: She is not in acute distress.    Appearance: Normal appearance. She is not ill-appearing or toxic-appearing.  HENT:     Head: Normocephalic and atraumatic.     Right Ear: Tympanic membrane normal.     Left Ear: A middle ear effusion is present. Tympanic membrane is injected.     Nose: Nose normal. No congestion.     Mouth/Throat:     Mouth: Mucous membranes are moist.     Pharynx: Oropharynx is clear.  Eyes:     General: No scleral icterus.    Extraocular Movements: Extraocular movements intact.  Cardiovascular:     Rate and Rhythm: Normal rate and regular rhythm.     Pulses: Normal pulses.     Heart sounds: Normal heart sounds.  Pulmonary:     Effort: No respiratory distress.     Breath sounds: Decreased breath sounds and wheezing present.  Abdominal:     General: Abdomen is flat. Bowel sounds are normal.     Palpations: Abdomen is soft.  Musculoskeletal:        General: Normal range of motion.  Lymphadenopathy:     Cervical: No cervical adenopathy.  Skin:    General: Skin is warm and dry.     Findings: No  rash.  Neurological:     General: No focal deficit present.     Mental Status: She is alert and oriented to person, place, and time. Mental status is at baseline.  Psychiatric:        Mood and Affect: Mood normal.         Behavior: Behavior normal.        Thought Content: Thought content normal.        Judgment: Judgment normal.     No results found.  Recent Results (from the past 2160 hour(s))  POC COVID-19 BinaxNow     Status: None   Collection Time: 05/16/23 11:11 AM  Result Value Ref Range   SARS Coronavirus 2 Ag Negative Negative  POCT Influenza A/B     Status: None   Collection Time: 05/16/23 11:12 AM  Result Value Ref Range   Influenza A, POC Negative Negative   Influenza B, POC Negative Negative        Garner Nash, MD, MS

## 2023-05-18 DIAGNOSIS — N939 Abnormal uterine and vaginal bleeding, unspecified: Secondary | ICD-10-CM | POA: Diagnosis not present

## 2023-05-18 DIAGNOSIS — Z30431 Encounter for routine checking of intrauterine contraceptive device: Secondary | ICD-10-CM | POA: Diagnosis not present

## 2023-05-30 ENCOUNTER — Encounter: Payer: Self-pay | Admitting: Family Medicine

## 2023-05-30 ENCOUNTER — Ambulatory Visit (INDEPENDENT_AMBULATORY_CARE_PROVIDER_SITE_OTHER): Payer: 59 | Admitting: Family Medicine

## 2023-05-30 VITALS — BP 138/82 | HR 73 | Temp 97.8°F | Wt 203.0 lb

## 2023-05-30 DIAGNOSIS — J339 Nasal polyp, unspecified: Secondary | ICD-10-CM | POA: Diagnosis not present

## 2023-05-30 DIAGNOSIS — J302 Other seasonal allergic rhinitis: Secondary | ICD-10-CM

## 2023-05-30 DIAGNOSIS — J4551 Severe persistent asthma with (acute) exacerbation: Secondary | ICD-10-CM

## 2023-05-30 DIAGNOSIS — J3089 Other allergic rhinitis: Secondary | ICD-10-CM

## 2023-05-30 DIAGNOSIS — K219 Gastro-esophageal reflux disease without esophagitis: Secondary | ICD-10-CM

## 2023-05-30 DIAGNOSIS — I10 Essential (primary) hypertension: Secondary | ICD-10-CM

## 2023-05-30 DIAGNOSIS — R0683 Snoring: Secondary | ICD-10-CM

## 2023-05-30 MED ORDER — IPRATROPIUM-ALBUTEROL 0.5-2.5 (3) MG/3ML IN SOLN
3.0000 mL | RESPIRATORY_TRACT | 0 refills | Status: DC | PRN
Start: 2023-05-30 — End: 2023-06-27

## 2023-05-30 MED ORDER — AMLODIPINE-OLMESARTAN 10-40 MG PO TABS
1.0000 | ORAL_TABLET | Freq: Every day | ORAL | 0 refills | Status: DC
Start: 2023-05-30 — End: 2023-06-27

## 2023-05-30 MED ORDER — ESOMEPRAZOLE MAGNESIUM 40 MG PO CPDR
40.0000 mg | DELAYED_RELEASE_CAPSULE | Freq: Every day | ORAL | 3 refills | Status: DC
Start: 2023-05-30 — End: 2023-09-05

## 2023-05-30 MED ORDER — MONTELUKAST SODIUM 10 MG PO TABS
10.0000 mg | ORAL_TABLET | Freq: Every day | ORAL | 3 refills | Status: DC
Start: 2023-05-30 — End: 2023-11-06

## 2023-05-30 MED ORDER — PROMETHAZINE-DM 6.25-15 MG/5ML PO SYRP
5.0000 mL | ORAL_SOLUTION | Freq: Four times a day (QID) | ORAL | 0 refills | Status: DC | PRN
Start: 2023-05-30 — End: 2023-08-07

## 2023-05-30 NOTE — Patient Instructions (Signed)
Start using DuoNeb (nebulizer) once every 4 hours as needed, especially for asthma symptoms. Hold off on using albuterol inhaler or nebulizer while on DuoNebs. Stop omeprazole begin Nexium (esomeprazole) 40 mg daily to manage GERD symptoms. Start Singulair (Montelukast) as prescribed for asthma and allergies. Use promethazine-dextromethorphan syrup as needed to manage cough. For blood pressure, start taking the combination pill of Amlodipine 10 mg/Olmesartan 40 mg daily. Continue using Flonase and Zyrtec for allergy management. Continue efforts to quit smoking. Avoid inhalation of smoke products, including marijuana. Schedule lab test in 2 weeks to check kidney function. Schedule a follow-up appointment in a month to recheck blood pressure. We are referring for a sleep study to investigate possible sleep apnea due to nasal polyps and snoring. If experiencing severe breathing difficulties, go to the emergency department immediately. Report any significant changes in symptoms or if symptoms worsen before your next scheduled appointments. Follow-up with your appointment with Dr. Lucie Leather next week additional Recommendations: Keep a symptom diary to track changes in throat irritation, coughing, asthma, and GERD symptoms. Follow up with your allergist for continuous allergy care.

## 2023-05-30 NOTE — Progress Notes (Signed)
Assessment/Plan:   Problem List Items Addressed This Visit       Cardiovascular and Mediastinum   Primary hypertension    Uncontrolled, persistent elevation  Plan: Initiate amlodipine 10 mg / olmesartan 40 mg daily to manage blood pressure. Schedule follow-up in 2 weeks for metabolic panel to check kidney function. Schedule follow-up in a month for blood pressure re-evaluation.      Relevant Medications   amLODipine-olmesartan (AZOR) 10-40 MG tablet   Other Relevant Orders   Basic Metabolic Panel (BMET)     Respiratory   Severe persistent asthma with exacerbation - Primary    The patient's persistent cough and wheezing are likely linked to her severe asthma and a possible recent viral infection.   Smoking cessation is recommended. Differential diagnosis includes GERD exacerbation and allergic rhinitis.  Plan: Switch from omeprazole to esomeprazole (Nexium) 40 mg for GERD management. Initiate DuoNebs every 4 hours as needed for asthma. Consider montelukast (Singulair) to manage asthma and allergies. Prescribe promethazine syrup with dextromethorphan for the cough. Schedule sleep study to rule out sleep apnea contributing to nighttime symptoms.      Relevant Medications   promethazine-dextromethorphan (PROMETHAZINE-DM) 6.25-15 MG/5ML syrup   ipratropium-albuterol (DUONEB) 0.5-2.5 (3) MG/3ML SOLN   montelukast (SINGULAIR) 10 MG tablet   Perennial allergic rhinitis with seasonal variation   Relevant Medications   montelukast (SINGULAIR) 10 MG tablet     Digestive   Gastroesophageal reflux disease   Relevant Medications   esomeprazole (NEXIUM) 40 MG capsule   Other Visit Diagnoses     Nasal polyp       Relevant Orders   Ambulatory referral to Sleep Studies   Snoring       Relevant Orders   Ambulatory referral to Sleep Studies       Medications Discontinued During This Encounter  Medication Reason   amoxicillin-clavulanate (AUGMENTIN) 875-125 MG tablet     predniSONE (DELTASONE) 50 MG tablet    omeprazole (PRILOSEC) 40 MG capsule     Return in about 4 weeks (around 06/27/2023) for fasting labs 2 weeks, BP.    Subjective:   Encounter date: 05/30/2023  Renee Cross is a 47 y.o. female who has Amblyopia of left eye; Anemia; Rheumatoid arthritis (HCC); Abnormal uterine bleeding (AUB); Severe persistent asthma with exacerbation; Hip arthritis; Gastroesophageal reflux disease; Perennial allergic rhinitis with seasonal variation; Preoperative evaluation to rule out surgical contraindication; Overuse of medication; Primary hypertension; Alcohol use disorder; Financial difficulty; Episode of recurrent major depressive disorder (HCC); B12 deficiency; History of suicide attempt; Non-recurrent acute suppurative otitis media of left ear without spontaneous rupture of tympanic membrane; and Tobacco use disorder on their problem list..   She  has a past medical history of Anemia, Arthritis, Asthma, Depression, GERD (gastroesophageal reflux disease), Hiatal hernia with GERD, Hypertension, Pneumonia, PTSD (post-traumatic stress disorder), and Sickle cell trait (HCC)..   Chief Complaint: Follow-up for persistent itchy, scratchy throat, coughing, stuffy nose, and high blood pressure.  History of Present Illness:  Throat and Respiratory Symptoms. The patient, Renee Cross, presents for a follow-up of ongoing symptoms including an itchy, scratchy throat, coughing, and a stuffy nose. She reports these symptoms have been intermittently persistent despite previously completing a course of prednisone and Augmentin, which initially provided some relief. However, the symptoms soon returned to their original state post-treatment. The patient mentions that her coughing disrupts her sleep, especially when she is lying on her back, and she experiences increased wheezing and snoring primarily at night. She uses albuterol inhaler  a couple of times a day, particularly when experiencing  shortness of breath and coughing episodes. Renee Cross has severe asthma and allergies.  She follows with Dr. Nena Polio with asthma and allergy and has been treated with Trelegy. She has a history of smoking cigarettes, which she has recently quit, but continues to occasionally smoke marijuana.  High Blood Pressure. Renee Cross's blood pressure remains high.  Likely worsened by recent steroid use.  Gastroesophageal Reflux Disease (GERD). The patient reports ongoing symptoms of reflux, which are episodic. She acknowledges that her reflux could be contributing to her throat irritation, nasal congestion, and cough. Current management includes omeprazole 40 mg daily.  Review of Systems  Constitutional:  Negative for chills and fever.  HENT:  Positive for congestion, ear pain and sinus pain.   Eyes:  Negative for blurred vision.  Respiratory:  Positive for cough, shortness of breath and wheezing.   Cardiovascular:  Negative for chest pain.  Gastrointestinal:  Positive for heartburn.  Neurological:  Positive for headaches. Negative for dizziness and focal weakness.  All other systems reviewed and are negative.   Past Surgical History:  Procedure Laterality Date   HERNIA REPAIR     SINUS EXPLORATION     STOMACH SURGERY  10/03/2001   Possible fundiplication?   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 75 mL 1   albuterol (VENTOLIN HFA) 108 (90 Base) MCG/ACT inhaler 2 puffs every 4-6 hours as needed for cough or wheeze. 18 g 1   cetirizine (ZYRTEC) 10 MG tablet Take 1 tablet (10 mg  total) by mouth daily. 1 po q day prn allergies (Patient taking differently: Take 10 mg by mouth at bedtime.) 30 tablet 0   cyanocobalamin (VITAMIN B12) 1000 MCG tablet Take 1 tablet (1,000 mcg total) by mouth daily. 90 tablet 3   ferrous sulfate 325 (65 FE) MG EC tablet Take 325 mg by mouth daily.     fluticasone (FLONASE) 50 MCG/ACT nasal spray 2 sprays per nostril every day for stuffy nose or drainage. 16 g 3   Fluticasone-Umeclidin-Vilant (TRELEGY ELLIPTA) 200-62.5-25 MCG/ACT AEPB Inhale 1 puff into the lungs in the morning. 60 each 5   Fluticasone-Umeclidin-Vilant (TRELEGY ELLIPTA) 200-62.5-25 MCG/ACT AEPB Take 1 puff once daily to prevent coughing or wheezing. 5 each 1   loratadine (CLARITIN) 10 MG tablet Take 1 tablet  1-2 times per day 60 tablet 3   meloxicam (MOBIC) 15 MG tablet Take 1 tablet (15 mg total) by mouth daily. For 2 weeks for pain and inflammation. Then take as needed 30 tablet 0   methocarbamol (ROBAXIN) 500 MG tablet Take 1 tablet (500 mg total) by mouth every 8 (eight) hours as needed for muscle spasms. 30 tablet 0   mometasone (ELOCON) 0.1 % ointment Apply topically daily. Apply once daily to red itchy areas or until resolved 45 g 3   Multiple Vitamin (MULTIVITAMIN WITH MINERALS) TABS tablet Take 1 tablet by mouth daily.     Olopatadine HCl (PATADAY) 0.2 % SOLN Place 1 drop into both eyes daily as needed (allergies). 2.5 mL  3   sennosides-docusate sodium (SENOKOT-S) 8.6-50 MG tablet Take 2 tablets by mouth daily. To help prevent constipation while taking pain medication 30 tablet 0   amoxicillin-clavulanate (AUGMENTIN) 875-125 MG tablet Take 1 tablet by mouth 2 (two) times daily. 20 tablet 0   omeprazole (PRILOSEC) 40 MG capsule Take 1 capsule (40 mg total) by mouth in the morning. 90 capsule 1   predniSONE (DELTASONE) 50 MG tablet Take 1 tablet daily for 5 days. 5 tablet 0   DULoxetine (CYMBALTA) 30 MG capsule Take 1 capsule (30 mg total) by mouth daily. 30 capsule 0   No  facility-administered medications prior to visit.    No family history on file.  Social History   Socioeconomic History   Marital status: Single    Spouse name: Not on file   Number of children: Not on file   Years of education: Not on file   Highest education level: Not on file  Occupational History   Not on file  Tobacco Use   Smoking status: Some Days    Current packs/day: 0.00    Average packs/day: 1 pack/day for 32.0 years (32.0 ttl pk-yrs)    Types: Cigarettes    Start date: 60    Last attempt to quit: 2024    Years since quitting: 0.6    Passive exposure: Never   Smokeless tobacco: Current   Tobacco comments:    Smokes black and mild occassionally  Vaping Use   Vaping status: Never Used  Substance and Sexual Activity   Alcohol use: Yes    Alcohol/week: 2.0 standard drinks of alcohol    Types: 2 Cans of beer per week   Drug use: Yes    Frequency: 4.0 times per week    Types: Marijuana   Sexual activity: Yes    Birth control/protection: Surgical  Other Topics Concern   Not on file  Social History Narrative   Not on file   Social Determinants of Health   Financial Resource Strain: Not on file  Food Insecurity: Not on file  Transportation Needs: Not on file  Physical Activity: Not on file  Stress: Not on file  Social Connections: Not on file  Intimate Partner Violence: Not on file                                                                                                  Objective:  Physical Exam: BP 138/82 (BP Location: Left Arm, Patient Position: Sitting, Cuff Size: Large)   Pulse 73   Temp 97.8 F (36.6 C) (Temporal)   Wt 203 lb (92.1 kg)   SpO2 99%   BMI 32.77 kg/m     Physical Exam  No results found.  Recent Results (from the past 2160 hour(s))  POC COVID-19 BinaxNow     Status: None   Collection Time: 05/16/23 11:11 AM  Result Value Ref Range   SARS Coronavirus 2 Ag Negative Negative  POCT Influenza A/B     Status: None    Collection Time: 05/16/23 11:12 AM  Result Value Ref Range   Influenza A, POC  Negative Negative   Influenza B, POC Negative Negative        Garner Nash, MD, MS

## 2023-05-30 NOTE — Progress Notes (Addendum)
Office Visit Note  [name]             Date of Birth: March 30, 1976           MRN: [id number]             PCP: Garnette Gunner, MD Referring: Garnette Gunner, MD Visit Date: 05/31/2023 Occupation: Currently none, previous custodial worker  Subjective:  New Patient (Initial Visit) (Patient states she is having pains in both of her legs. Patient states sometimes the pain is in certain areas and then sometimes it shoots down her legs. )   History of Present Illness: Renee Cross is a 47 y.o. female here for evaluation of possible rheumatoid arthritis previous positive rheumatoid factor and elevated sedimentation rate without confirmed inflammatory arthritis at previous rheumatology evaluation with Isabelle Course last year.  She has longstanding joint pain affecting her hips and knees with bone-on-bone osteoarthritis for which she underwent right total hip replacement in May planning for left replacement going forward.  Was not specifically diagnosed with avascular necrosis from chart review but has multiple underlying risk factors including chronic steroid use for severe asthma, history of alcohol abuse, and sickle cell trait.  Knee pain has been bothering her since about 4 years ago she did not recall any acute onset or proceeding with injury.  Pain is problematic after prolonged standing and walking also has a intense aching pain at rest that bothers her trying to sleep at night.  Notices visible knee swelling on the left side on some days others are normal.  Not associated with redness or hot to the touch.  She uses a cane sometimes when walking for stability her knee has locked up on occasions with associated loss of balance.  No major falls or associated injuries.  She still requires intermittent steroids for asthma exacerbations does not notice a particular difference in joint symptoms when taking this versus off of it.  She has tried intra-articular injections with orthopedic  provider a few years ago that never helped much.  Was prescribed meloxicam gets a small benefit when taking NSAIDs.  Today is a pretty good day for her symptoms.  02/2023 HCV neg  12/2022 ESR 46  01/2022 RF 40 CCP neg ESR 40 CRP wnl  Imaging reviewed 08/18/2020 Bilateral Knees  Standing AP lateral and sunrise bilateral knees taken today in the office and reviewed by myself shows mild to moderate patellofemoral degenerative changes right greater than left.  No acute abnormalities.   Activities of Daily Living:  Patient reports morning stiffness for 30 minutes.   Patient Reports nocturnal pain.  Difficulty dressing/grooming: Reports Difficulty climbing stairs: Reports Difficulty getting out of chair: Reports Difficulty using hands for taps, buttons, cutlery, and/or writing: Denies  Review of Systems  Constitutional:  Positive for fatigue.  HENT:  Positive for mouth dryness. Negative for mouth sores.   Eyes:  Positive for dryness.  Respiratory:  Positive for shortness of breath.   Cardiovascular:  Positive for chest pain. Negative for palpitations.  Gastrointestinal:  Negative for blood in stool, constipation and diarrhea.  Endocrine: Negative for increased urination.  Genitourinary:  Positive for involuntary urination.  Musculoskeletal:  Positive for joint pain, gait problem, joint pain, joint swelling, myalgias, muscle weakness, morning stiffness, muscle tenderness and myalgias.  Skin:  Positive for rash. Negative for color change, hair loss and sensitivity to sunlight.  Allergic/Immunologic: Positive for susceptible to infections.  Neurological:  Positive for dizziness and headaches.  Hematological:  Negative  for swollen glands.  Psychiatric/Behavioral:  Positive for depressed mood and sleep disturbance. The patient is nervous/anxious.     PMFS History:  Patient Active Problem List   Diagnosis Date Noted   Non-recurrent acute suppurative otitis media of left ear without  spontaneous rupture of tympanic membrane 05/16/2023   Tobacco use disorder 05/16/2023   Alcohol use disorder 01/14/2023   Financial difficulty 01/14/2023   Episode of recurrent major depressive disorder (HCC) 01/14/2023   B12 deficiency 01/14/2023   History of suicide attempt 01/14/2023   Amblyopia of left eye 12/22/2022   Anemia 12/22/2022   Rheumatoid arthritis (HCC) 12/22/2022   Abnormal uterine bleeding (AUB) 12/22/2022   Severe persistent asthma with exacerbation 12/22/2022   Hip arthritis 12/22/2022   Gastroesophageal reflux disease 12/22/2022   Perennial allergic rhinitis with seasonal variation 12/22/2022   Preoperative evaluation to rule out surgical contraindication 12/22/2022   Overuse of medication 12/22/2022   Primary hypertension 12/22/2022    Past Medical History:  Diagnosis Date   Anemia    Arthritis    Asthma    Depression    GERD (gastroesophageal reflux disease)    Hiatal hernia with GERD    Hypertension    Pneumonia    PTSD (post-traumatic stress disorder)    Sickle cell trait (HCC)     History reviewed. No pertinent family history. Past Surgical History:  Procedure Laterality Date   HERNIA REPAIR     SINUS EXPLORATION     STOMACH SURGERY  10/03/2001   Possible fundiplication?   TONSILLECTOMY     TOTAL HIP ARTHROPLASTY Right 02/14/2023   Procedure: TOTAL HIP ARTHROPLASTY ANTERIOR APPROACH;  Surgeon: Sheral Apley, MD;  Location: [facility];  Service: Orthopedics;  Laterality: Right;   TUBAL LIGATION     Social History   Social History Narrative   Not on file   Immunization History  Administered Date(s) Administered   HPV 9-valent 12/08/2020, 02/06/2023     Objective: Vital Signs: BP (!) 167/103 (BP Location: Right Arm, Patient Position: Sitting, Cuff Size: Normal)   Pulse 65   Resp 12   Ht 5\' 6"  (1.676 m)   Wt 201 lb (91.2 kg)   BMI 32.44 kg/m    Physical Exam Constitutional:      Appearance: She is obese.  HENT:     Mouth/Throat:      Mouth: Mucous membranes are moist.     Pharynx: Oropharynx is clear.  Eyes:     Conjunctiva/sclera: Conjunctivae normal.  Cardiovascular:     Rate and Rhythm: Normal rate and regular rhythm.  Pulmonary:     Effort: Pulmonary effort is normal.     Breath sounds: Normal breath sounds.  Musculoskeletal:     Right lower leg: No edema.     Left lower leg: No edema.  Lymphadenopathy:     Cervical: No cervical adenopathy.  Skin:    General: Skin is warm and dry.     Findings: No rash.  Neurological:     Mental Status: She is alert.  Psychiatric:        Mood and Affect: Mood normal.      Musculoskeletal Exam:  Shoulders full ROM no tenderness or swelling Elbows full ROM no tenderness or swelling Wrists full ROM no tenderness or swelling Fingers full ROM no tenderness or swelling Left hip severely limited internal rotation with groin pain provoked Knees full ROM no swelling, left knee tenderness with valgus pressure otherwise normal Ankles full ROM no tenderness or  swelling Bilateral flat feet   Investigation: No additional findings.  Imaging: XR KNEE 3 VIEW LEFT  Result Date: 06/03/2023 X-ray left knee 3 views Medial lateral compartment joint space appears normal.  Patellofemoral joint appears normal.  Patellar structure ring and or small inferior osteophyte present.  No erosions abnormal calcifications or visible effusions. Impression Normal x-ray  XR KNEE 3 VIEW RIGHT  Result Date: 06/03/2023 X-ray right knee 3 views Some mild appearing osteoarthritis with some narrowing at the medial edge of joint and osteophyte or enthesophyte process at the tibiofibular joint.  Small inferior patellar osteophyte.  May be small joint effusion or other soft tissue swelling noted at medial edge of joint.  No erosions or abnormal calcifications seen. Impression Mild appearing multicompartmental osteoarthritis possible small effusion no acute appearing abnormality   Recent Labs: Lab  Results  Component Value Date   WBC 8.4 02/14/2023   HGB 10.2 (L) 02/14/2023   PLT 190 02/14/2023   NA 136 02/01/2023   K 3.6 02/01/2023   CL 106 02/01/2023   CO2 22 02/01/2023   GLUCOSE 98 02/01/2023   BUN 12 02/01/2023   CREATININE 0.79 02/01/2023   BILITOT 0.4 12/27/2022   ALKPHOS 73 12/27/2022   AST 14 12/27/2022   ALT 11 12/27/2022   PROT 7.1 12/27/2022   ALBUMIN 4.2 12/27/2022   CALCIUM 8.3 (L) 02/01/2023   GFRAA >90 03/28/2014    Speciality Comments: No specialty comments available.  Procedures:  No procedures performed Allergies: Patient has no known allergies.   Assessment / Plan:     Visit Diagnoses: Rheumatoid arthritis involving multiple sites with positive rheumatoid factor (HCC) - Plan: Rheumatoid factor, Sedimentation rate, C-reactive protein, Mutated Citrullinated Vimentin (MCV) Antibody  Joint pain and stiffness at multiple areas with previous elevated rheumatoid factor and sedimentation rate I do not see any peripheral joint synovitis for sure on exam today.  Cannot entirely rule out the possibility of trace knee effusion.  Rechecking the previous elevated rheumatoid factor and for MCV antibody also checking sed rate and CRP for current systemic inflammation.  If lab results are similarly inconclusive or negative now we could also benefit by really trying to bring her in as needed for a flare up for joint aspiration and synovial fluid analysis.  Hip arthritis Severe persistent asthma with exacerbation  History of severe persistent asthma with at times prolonged systemic steroids for treatment.  Suspect probable contributor to early onset of degenerative hip arthritis.  Alcohol use disorder  History of alcohol abuse.  This is also a potential contributor o early onset degenerative hip arthritis.  Chronic pain of left knee - Plan: XR KNEE 3 VIEW RIGHT, XR KNEE 3 VIEW LEFT  Chronic knee pain good range of motion and no swelling on the exam today.  X-ray of  bilateral knees appears to show mild degenerative arthritis of the right knee without problems on the left though this does not really correspond with symptoms.  For osteoarthritis related pain could try for intra-articular steroid injection intra for interested or addition of low-dose Cymbalta for neuropathic pain related to knee OA.  Orders: Orders Placed This Encounter  Procedures   XR KNEE 3 VIEW RIGHT   XR KNEE 3 VIEW LEFT   Rheumatoid factor   Sedimentation rate   C-reactive protein   Mutated Citrullinated Vimentin (MCV) Antibody   No orders of the defined types were placed in this encounter.    Follow-Up Instructions: Return in about 4 weeks (around 06/28/2023) for  New pt ?RA/OA f/u 42mo.   Fuller Plan, MD  Note - This record has been created using [employer].  Chart creation errors have been sought, but may not always  have been located. Such creation errors do not reflect on  the standard of medical care.

## 2023-05-30 NOTE — Assessment & Plan Note (Signed)
The patient's persistent cough and wheezing are likely linked to her severe asthma and a possible recent viral infection.   Smoking cessation is recommended. Differential diagnosis includes GERD exacerbation and allergic rhinitis.  Plan: Switch from omeprazole to esomeprazole (Nexium) 40 mg for GERD management. Initiate DuoNebs every 4 hours as needed for asthma. Consider montelukast (Singulair) to manage asthma and allergies. Prescribe promethazine syrup with dextromethorphan for the cough. Schedule sleep study to rule out sleep apnea contributing to nighttime symptoms.

## 2023-05-30 NOTE — Assessment & Plan Note (Signed)
Uncontrolled, persistent elevation  Plan: Initiate amlodipine 10 mg / olmesartan 40 mg daily to manage blood pressure. Schedule follow-up in 2 weeks for metabolic panel to check kidney function. Schedule follow-up in a month for blood pressure re-evaluation.

## 2023-05-31 ENCOUNTER — Ambulatory Visit: Payer: 59

## 2023-05-31 ENCOUNTER — Encounter: Payer: Self-pay | Admitting: Internal Medicine

## 2023-05-31 ENCOUNTER — Ambulatory Visit: Payer: 59 | Attending: Internal Medicine | Admitting: Internal Medicine

## 2023-05-31 ENCOUNTER — Ambulatory Visit (INDEPENDENT_AMBULATORY_CARE_PROVIDER_SITE_OTHER): Payer: 59

## 2023-05-31 VITALS — BP 167/103 | HR 65 | Resp 12 | Ht 66.0 in | Wt 201.0 lb

## 2023-05-31 DIAGNOSIS — G8929 Other chronic pain: Secondary | ICD-10-CM

## 2023-05-31 DIAGNOSIS — M1610 Unilateral primary osteoarthritis, unspecified hip: Secondary | ICD-10-CM

## 2023-05-31 DIAGNOSIS — M25562 Pain in left knee: Secondary | ICD-10-CM

## 2023-05-31 DIAGNOSIS — M25561 Pain in right knee: Secondary | ICD-10-CM

## 2023-05-31 DIAGNOSIS — M0579 Rheumatoid arthritis with rheumatoid factor of multiple sites without organ or systems involvement: Secondary | ICD-10-CM

## 2023-05-31 DIAGNOSIS — F1090 Alcohol use, unspecified, uncomplicated: Secondary | ICD-10-CM

## 2023-05-31 DIAGNOSIS — J4551 Severe persistent asthma with (acute) exacerbation: Secondary | ICD-10-CM

## 2023-06-06 LAB — MUTATED CITRULLINATED VIMENTIN (MCV) ANTIBODY: MUTATED CITRULLINATED VIMENTIN (MCV) AB: <20 U/mL (ref <20)

## 2023-06-06 LAB — RHEUMATOID FACTOR: Rheumatoid fact SerPl-aCnc: 39 [IU]/mL — ABNORMAL HIGH (ref <14)

## 2023-06-06 LAB — SEDIMENTATION RATE: Sed Rate: 6 mm/h (ref 0–20)

## 2023-06-06 LAB — C-REACTIVE PROTEIN: CRP: 4 mg/L (ref <8.0)

## 2023-06-08 DIAGNOSIS — Z23 Encounter for immunization: Secondary | ICD-10-CM | POA: Diagnosis not present

## 2023-06-13 ENCOUNTER — Other Ambulatory Visit (INDEPENDENT_AMBULATORY_CARE_PROVIDER_SITE_OTHER): Payer: 59

## 2023-06-13 ENCOUNTER — Encounter: Payer: Self-pay | Admitting: Allergy and Immunology

## 2023-06-13 ENCOUNTER — Ambulatory Visit (INDEPENDENT_AMBULATORY_CARE_PROVIDER_SITE_OTHER): Payer: 59 | Admitting: Allergy and Immunology

## 2023-06-13 ENCOUNTER — Other Ambulatory Visit: Payer: Self-pay

## 2023-06-13 VITALS — BP 136/78 | HR 85 | Temp 97.8°F | Resp 18 | Ht 66.0 in | Wt 198.2 lb

## 2023-06-13 DIAGNOSIS — I10 Essential (primary) hypertension: Secondary | ICD-10-CM | POA: Diagnosis not present

## 2023-06-13 DIAGNOSIS — Z8709 Personal history of other diseases of the respiratory system: Secondary | ICD-10-CM

## 2023-06-13 DIAGNOSIS — J4550 Severe persistent asthma, uncomplicated: Secondary | ICD-10-CM | POA: Diagnosis not present

## 2023-06-13 DIAGNOSIS — K219 Gastro-esophageal reflux disease without esophagitis: Secondary | ICD-10-CM | POA: Diagnosis not present

## 2023-06-13 DIAGNOSIS — L2089 Other atopic dermatitis: Secondary | ICD-10-CM | POA: Diagnosis not present

## 2023-06-13 DIAGNOSIS — J3089 Other allergic rhinitis: Secondary | ICD-10-CM | POA: Diagnosis not present

## 2023-06-13 DIAGNOSIS — J301 Allergic rhinitis due to pollen: Secondary | ICD-10-CM | POA: Diagnosis not present

## 2023-06-13 LAB — BASIC METABOLIC PANEL
BUN: 13 mg/dL (ref 6–23)
CO2: 23 meq/L (ref 19–32)
Calcium: 8.9 mg/dL (ref 8.4–10.5)
Chloride: 108 meq/L (ref 96–112)
Creatinine, Ser: 0.56 mg/dL (ref 0.40–1.20)
GFR: 108.91 mL/min (ref >60.00)
Glucose, Bld: 100 mg/dL — ABNORMAL HIGH (ref 70–99)
Potassium: 4.1 meq/L (ref 3.5–5.1)
Sodium: 139 meq/L (ref 135–145)

## 2023-06-13 MED ORDER — LORATADINE 10 MG PO TABS: 10.0000 mg | ORAL_TABLET | Freq: Two times a day (BID) | ORAL | 1 refills | Status: DC

## 2023-06-13 MED ORDER — OMEPRAZOLE 40 MG PO CPDR: 40.0000 mg | DELAYED_RELEASE_CAPSULE | Freq: Every morning | ORAL | 1 refills | Status: DC

## 2023-06-13 MED ORDER — FLUTICASONE PROPIONATE 50 MCG/ACT NA SUSP: 2.0000 | Freq: Every morning | NASAL | 1 refills | Status: DC

## 2023-06-13 MED ORDER — TRELEGY ELLIPTA 200-62.5-25 MCG/ACT IN AEPB: 1.0000 | INHALATION_SPRAY | Freq: Every morning | RESPIRATORY_TRACT | 1 refills | Status: DC

## 2023-06-13 MED ORDER — MOMETASONE FUROATE 0.1 % EX OINT: TOPICAL_OINTMENT | Freq: Every day | CUTANEOUS | 1 refills | Status: DC

## 2023-06-13 NOTE — Progress Notes (Unsigned)
[place] - [place] - [place] - [place] - [place]   Follow-up Note  Referring Provider: Garnette Gunner, MD Primary Provider: Garnette Gunner, MD Date of Office Visit: 06/13/2023  Subjective:   Renee Cross (DOB: [birth year]) is a 47 y.o. female who [facility] in re-evaluation of the following:  HPI: Renee Cross returns to this clinic in evaluation of asthma, allergic rhinitis, atopic dermatitis, history of nasal polyposis, history of reflux.  I last saw her in this clinic 21 Feb 2023.  During her last visit she appeared to have ongoing respiratory tract inflammation and we started her on tezepelumab but because of a logistical issue she has not received this medication.  She is to have her tezepelumab administration at the [facility] clinic.  She continues to have problems with recurrent wheezing and coughing and she uses a bronchodilator few times per week.  She was having difficulty using Trelegy inhaler and we changed her to [place] but her insurance would not approve the use of this medicine and she is back on Trelegy.  Her problem that she had with Trelegy in the past was laryngeal spasm but fortunately that has not redeveloped since she has restarted her Trelegy.  She believes that her upper airway is doing relatively well.  She has recently required an antibiotic and prednisone because she became "sick" in August 2024 with nasal congestion and coughing and runny nose.  She also had some problems with her hypertension around that point in time she has been placed on a new antihypertensive medicine.  She still has some stuffiness and some coughing and phlegm and some ear popping but she is much better than she was a few weeks ago.  She does not have any anosmia or ugly nasal discharge or fever or constitutional symptoms.  She believes that her reflux is under very good control at this point in time.  She rarely uses any topical  mometasone for her atopic dermatitis.  Allergies as of 06/13/2023   No Known Allergies      Medication List    albuterol (2.5 MG/3ML) 0.083% nebulizer solution Commonly known as: PROVENTIL Take 3 mLs (2.5 mg total) by nebulization every 4 (four) hours as needed for wheezing or shortness of breath.   albuterol 108 (90 Base) MCG/ACT inhaler Commonly known as: VENTOLIN HFA 2 puffs every 4-6 hours as needed for cough or wheeze.   amLODipine-olmesartan 10-40 MG tablet Commonly known as: AZOR Take 1 tablet by mouth daily.   cetirizine 10 MG tablet Commonly known as: ZYRTEC Take 1 tablet (10 mg total) by mouth daily. 1 po q day prn allergies   cyanocobalamin 1000 MCG tablet Commonly known as: VITAMIN B12 Take 1 tablet (1,000 mcg total) by mouth daily.   DULoxetine 30 MG capsule Commonly known as: Cymbalta Take 1 capsule (30 mg total) by mouth daily.   esomeprazole 40 MG capsule Commonly known as: NexIUM Take 1 capsule (40 mg total) by mouth daily.   ferrous sulfate 325 (65 FE) MG EC tablet Take 325 mg by mouth daily.   fluticasone 50 MCG/ACT nasal spray Commonly known as: FLONASE 2 sprays per nostril every day for stuffy nose or drainage.   ipratropium-albuterol 0.5-2.5 (3) MG/3ML Soln Commonly known as: DUONEB Take 3 mLs by nebulization every 4 (four) hours as needed.   loratadine 10 MG tablet Commonly known as: Claritin Take 1 tablet  1-2 times per day   medroxyPROGESTERone 10 MG  tablet Commonly known as: PROVERA Take 1 tablet by mouth daily.   meloxicam 15 MG tablet Commonly known as: MOBIC Take 1 tablet (15 mg total) by mouth daily. For 2 weeks for pain and inflammation. Then take as needed   methocarbamol 500 MG tablet Commonly known as: ROBAXIN Take 1 tablet (500 mg total) by mouth every 8 (eight) hours as needed for muscle spasms.   mometasone 0.1 % ointment Commonly known as: ELOCON Apply topically daily. Apply once daily to red itchy areas or until  resolved   montelukast 10 MG tablet Commonly known as: SINGULAIR Take 1 tablet (10 mg total) by mouth at bedtime.   multivitamin with minerals Tabs tablet Take 1 tablet by mouth daily.   Olopatadine HCl 0.2 % Soln Commonly known as: Pataday Place 1 drop into both eyes daily as needed (allergies).   promethazine-dextromethorphan 6.25-15 MG/5ML syrup Commonly known as: PROMETHAZINE-DM Take 5 mLs by mouth 4 (four) times daily as needed.   sennosides-docusate sodium 8.6-50 MG tablet Commonly known as: SENOKOT-S Take 2 tablets by mouth daily. To help prevent constipation while taking pain medication   Trelegy Ellipta 200-62.5-25 MCG/ACT Aepb Generic drug: Fluticasone-Umeclidin-Vilant Inhale 1 puff into the lungs in the morning.    Past Medical History:  Diagnosis Date   Anemia    Arthritis    Asthma    Depression    GERD (gastroesophageal reflux disease)    Hiatal hernia with GERD    Hypertension    Pneumonia    PTSD (post-traumatic stress disorder)    Sickle cell trait (HCC)     Past Surgical History:  Procedure Laterality Date   HERNIA REPAIR     SINUS EXPLORATION     STOMACH SURGERY  10/03/2001   Possible fundiplication?   TONSILLECTOMY     TOTAL HIP ARTHROPLASTY Right 02/14/2023   Procedure: TOTAL HIP ARTHROPLASTY ANTERIOR APPROACH;  Surgeon: Sheral Apley, MD;  Location: [facility];  Service: Orthopedics;  Laterality: Right;   TUBAL LIGATION      Review of systems negative except as noted in HPI / PMHx or noted below:  Review of Systems  Constitutional: Negative.   HENT: Negative.    Eyes: Negative.   Respiratory: Negative.    Cardiovascular: Negative.   Gastrointestinal: Negative.   Genitourinary: Negative.   Musculoskeletal: Negative.   Skin: Negative.   Neurological: Negative.   Endo/Heme/Allergies: Negative.   Psychiatric/Behavioral: Negative.       Objective:   Vitals:   06/13/23 1142  BP: 136/78  Pulse: 85  Resp: 18  Temp: 97.8 F  (36.6 C)  SpO2: 99%   Height: 5\' 6"  (167.6 cm)  Weight: 198 lb 3.2 oz (89.9 kg)   Physical Exam Constitutional:      Appearance: She is not diaphoretic.  HENT:     Head: Normocephalic.     Right Ear: Tympanic membrane, ear canal and external ear normal.     Left Ear: Tympanic membrane, ear canal and external ear normal.     Nose: Nose normal. No mucosal edema or rhinorrhea.     Mouth/Throat:     Pharynx: Uvula midline. No oropharyngeal exudate.  Eyes:     Conjunctiva/sclera: Conjunctivae normal.  Neck:     Thyroid: No thyromegaly.     Trachea: Trachea normal. No tracheal tenderness or tracheal deviation.  Cardiovascular:     Rate and Rhythm: Normal rate and regular rhythm.     Heart sounds: Normal heart sounds, S1 normal and S2  normal. No murmur heard. Pulmonary:     Effort: No respiratory distress.     Breath sounds: Normal breath sounds. No stridor. No wheezing or rales.  Lymphadenopathy:     Head:     Right side of head: No tonsillar adenopathy.     Left side of head: No tonsillar adenopathy.     Cervical: No cervical adenopathy.  Skin:    Findings: No erythema or rash.     Nails: There is no clubbing.  Neurological:     Mental Status: She is alert.     Diagnostics: Spirometry was performed and demonstrated an FEV1 of 1.71 at 66 % of predicted.  Assessment and Plan:   1. Not well controlled severe persistent asthma   2. Perennial allergic rhinitis   3. Seasonal allergic rhinitis due to pollen   4. Other atopic dermatitis   5. History of nasal polyposis   6. Gastroesophageal reflux disease, unspecified whether esophagitis present    1.  Allergen avoidance measures -dust, mold, pollens  2.  Treat and prevent inflammation:  A. Trelegy 200 - 1 inhalation 1 time per day (empty lungs) B. Flonase - 2 spray each nostril 1 time per day C. Obtain tezepelumab injections in [place]  3. Treat and prevent reflux:  A. Omeprazole 40 mg - 1 tablet 1 time per  day  4. If needed:   A. Albuterol HFA - 2 inhalations every 4-6 hours B. Loratadine 10 mg - 1 tablet 1-2 times per day C. Shower followed by mometasone 0.1% ointment daily   5. Return to clinic in 12 weeks or earlier if problem  6. Obtain fall flu vaccine  Renee Cross needs to start her tezepelumab and I have asked her to contact the [facility] office and arrange to have that injection administered as soon as possible.  She can certainly remain on anti-inflammatory agents for her upper and lower airway but she needs that biologic agent to get her respiratory tract inflammatory state under control.  If she does well with this plan I will see her back in this clinic in 12 weeks or earlier if there is a problem.  Renee Schimke, MD Allergy / Immunology  [facility]

## 2023-06-13 NOTE — Patient Instructions (Signed)
  1.  Allergen avoidance measures -dust, mold, pollens  2.  Treat and prevent inflammation:  A. Trelegy 200 - 1 inhalation 1 time per day (empty lungs) B. Flonase - 2 spray each nostril 1 time per day C. Obtain tezepelumab injections in [place]  3. Treat and prevent reflux:  A. Omeprazole 40 mg - 1 tablet 1 time per day  4. If needed:   A. Albuterol HFA - 2 inhalations every 4-6 hours B. Loratadine 10 mg - 1 tablet 1-2 times per day C. Shower followed by mometasone 0.1% ointment daily   5. Return to clinic in 12 weeks or earlier if problem  6. Obtain fall flu vaccine

## 2023-06-14 ENCOUNTER — Encounter: Payer: Self-pay | Admitting: Allergy and Immunology

## 2023-06-27 ENCOUNTER — Encounter (HOSPITAL_COMMUNITY): Payer: Self-pay | Admitting: Psychiatry

## 2023-06-27 ENCOUNTER — Encounter: Payer: Self-pay | Admitting: Family Medicine

## 2023-06-27 ENCOUNTER — Other Ambulatory Visit: Payer: Self-pay | Admitting: Family Medicine

## 2023-06-27 ENCOUNTER — Ambulatory Visit (INDEPENDENT_AMBULATORY_CARE_PROVIDER_SITE_OTHER): Payer: 59 | Admitting: Family Medicine

## 2023-06-27 ENCOUNTER — Telehealth (HOSPITAL_BASED_OUTPATIENT_CLINIC_OR_DEPARTMENT_OTHER): Payer: 59 | Admitting: Psychiatry

## 2023-06-27 VITALS — BP 139/79 | HR 79 | Temp 97.4°F | Wt 203.2 lb

## 2023-06-27 VITALS — Wt 203.0 lb

## 2023-06-27 DIAGNOSIS — I10 Essential (primary) hypertension: Secondary | ICD-10-CM

## 2023-06-27 DIAGNOSIS — F4312 Post-traumatic stress disorder, chronic: Secondary | ICD-10-CM

## 2023-06-27 DIAGNOSIS — F1210 Cannabis abuse, uncomplicated: Secondary | ICD-10-CM

## 2023-06-27 DIAGNOSIS — F17200 Nicotine dependence, unspecified, uncomplicated: Secondary | ICD-10-CM | POA: Diagnosis not present

## 2023-06-27 DIAGNOSIS — J4551 Severe persistent asthma with (acute) exacerbation: Secondary | ICD-10-CM

## 2023-06-27 DIAGNOSIS — G4700 Insomnia, unspecified: Secondary | ICD-10-CM | POA: Diagnosis not present

## 2023-06-27 DIAGNOSIS — M0579 Rheumatoid arthritis with rheumatoid factor of multiple sites without organ or systems involvement: Secondary | ICD-10-CM

## 2023-06-27 DIAGNOSIS — F1010 Alcohol abuse, uncomplicated: Secondary | ICD-10-CM | POA: Diagnosis not present

## 2023-06-27 DIAGNOSIS — F339 Major depressive disorder, recurrent, unspecified: Secondary | ICD-10-CM

## 2023-06-27 DIAGNOSIS — F331 Major depressive disorder, recurrent, moderate: Secondary | ICD-10-CM

## 2023-06-27 MED ORDER — OLMESARTAN-AMLODIPINE-HCTZ 40-10-25 MG PO TABS
1.0000 | ORAL_TABLET | Freq: Every day | ORAL | 3 refills | Status: AC
Start: 2023-06-27 — End: 2024-06-21

## 2023-06-27 MED ORDER — QUETIAPINE FUMARATE 25 MG PO TABS
25.0000 mg | ORAL_TABLET | Freq: Every day | ORAL | 1 refills | Status: DC
Start: 2023-06-27 — End: 2023-08-08

## 2023-06-27 MED ORDER — DULOXETINE HCL 30 MG PO CPEP
30.0000 mg | ORAL_CAPSULE | Freq: Every day | ORAL | 1 refills | Status: DC
Start: 2023-06-27 — End: 2023-08-08

## 2023-06-27 NOTE — Progress Notes (Signed)
Office Visit Note  [name]             Date of Birth: March 07, 1976           MRN: [id number]             PCP: Garnette Gunner, MD Referring: Garnette Gunner, MD Visit Date: 06/28/2023   Subjective:  Follow-up (Patient states she has to use her cane Monday and some yesterday because her legs were swollen and her right knee was in pain when she was trying to use it or put pressure on it. Patient states she is aching and when she walks she is in pain. )   History of Present Illness: Renee Cross is a 47 y.o. female here for follow up for joint pain with recurrent right knee swelling and abnormal labs evaluation at our initial visit still showed a positive rheumatoid factor but otherwise negative markers for inflammation and x-ray just showing mild osteoarthritis.  Was having active pain this week using a cane to partially offload this.  She had an appointment with her behavioral health clinic yesterday recommending addition of Cymbalta for anxiety and depression symptoms but has not started this.   Previous HPI 05/31/23 Renee Cross is a 47 y.o. female here for evaluation of possible rheumatoid arthritis previous positive rheumatoid factor and elevated sedimentation rate without confirmed inflammatory arthritis at previous rheumatology evaluation with Isabelle Course last year.  She has longstanding joint pain affecting her hips and knees with bone-on-bone osteoarthritis for which she underwent right total hip replacement in May planning for left replacement going forward.  Was not specifically diagnosed with avascular necrosis from chart review but has multiple underlying risk factors including chronic steroid use for severe asthma, history of alcohol abuse, and sickle cell trait.  Knee pain has been bothering her since about 4 years ago she did not recall any acute onset or proceeding with injury.  Pain is problematic after prolonged standing and walking also has a intense aching  pain at rest that bothers her trying to sleep at night.  Notices visible knee swelling on the left side on some days others are normal.  Not associated with redness or hot to the touch.  She uses a cane sometimes when walking for stability her knee has locked up on occasions with associated loss of balance.  No major falls or associated injuries.  She still requires intermittent steroids for asthma exacerbations does not notice a particular difference in joint symptoms when taking this versus off of it.  She has tried intra-articular injections with orthopedic provider a few years ago that never helped much.  Was prescribed meloxicam gets a small benefit when taking NSAIDs.  Today is a pretty good day for her symptoms.   02/2023 HCV neg   12/2022 ESR 46   01/2022 RF 40 CCP neg ESR 40 CRP wnl   Imaging reviewed 08/18/2020 Bilateral Knees Standing AP lateral and sunrise bilateral knees taken today in the office and reviewed by myself shows mild to moderate patellofemoral degenerative changes right greater than left.  No acute abnormalities.       Review of Systems  Constitutional:  Positive for fatigue.  HENT:  Positive for mouth dryness. Negative for mouth sores.   Eyes:  Negative for dryness.  Respiratory:  Positive for shortness of breath.   Cardiovascular:  Negative for chest pain and palpitations.  Gastrointestinal:  Negative for blood in stool, constipation and diarrhea.  Endocrine: Negative for increased  urination.  Genitourinary:  Positive for involuntary urination.  Musculoskeletal:  Positive for joint pain, gait problem, joint pain, joint swelling, myalgias, muscle weakness, morning stiffness, muscle tenderness and myalgias.  Skin:  Positive for color change. Negative for rash, hair loss and sensitivity to sunlight.  Allergic/Immunologic: Positive for susceptible to infections.  Neurological:  Positive for dizziness and headaches.  Hematological:  Negative for swollen glands.   Psychiatric/Behavioral:  Positive for depressed mood and sleep disturbance. The patient is not nervous/anxious.     PMFS History:  Patient Active Problem List   Diagnosis Date Noted   Osteoarthritis of right knee 07/02/2023   Bilateral knee pain 06/28/2023   Non-recurrent acute suppurative otitis media of left ear without spontaneous rupture of tympanic membrane 05/16/2023   Tobacco use disorder 05/16/2023   Alcohol use disorder 01/14/2023   Financial difficulty 01/14/2023   Episode of recurrent major depressive disorder (HCC) 01/14/2023   B12 deficiency 01/14/2023   History of suicide attempt 01/14/2023   Amblyopia of left eye 12/22/2022   Anemia 12/22/2022   Rheumatoid arthritis (HCC) 12/22/2022   Abnormal uterine bleeding (AUB) 12/22/2022   Severe persistent asthma with exacerbation 12/22/2022   Hip arthritis 12/22/2022   Gastroesophageal reflux disease 12/22/2022   Perennial allergic rhinitis with seasonal variation 12/22/2022   Preoperative evaluation to rule out surgical contraindication 12/22/2022   Overuse of medication 12/22/2022   Primary hypertension 12/22/2022    Past Medical History:  Diagnosis Date   Anemia    Arthritis    Asthma    Depression    GERD (gastroesophageal reflux disease)    Hiatal hernia with GERD    Hypertension    Pneumonia    PTSD (post-traumatic stress disorder)    Sickle cell trait (HCC)     History reviewed. No pertinent family history. Past Surgical History:  Procedure Laterality Date   HERNIA REPAIR     SINUS EXPLORATION     STOMACH SURGERY  10/03/2001   Possible fundiplication?   TONSILLECTOMY     TOTAL HIP ARTHROPLASTY Right 02/14/2023   Procedure: TOTAL HIP ARTHROPLASTY ANTERIOR APPROACH;  Surgeon: Sheral Apley, MD;  Location: [facility];  Service: Orthopedics;  Laterality: Right;   TUBAL LIGATION     Social History   Social History Narrative   Not on file   Immunization History  Administered Date(s) Administered    HPV 9-valent 12/08/2020, 02/06/2023, 06/08/2023     Objective: Vital Signs: BP 134/84 (BP Location: Left Arm, Patient Position: Sitting, Cuff Size: Normal)   Pulse 86   Resp 14   Ht 5\' 6"  (1.676 m)   Wt 203 lb (92.1 kg)   BMI 32.77 kg/m    Physical Exam Cardiovascular:     Rate and Rhythm: Normal rate and regular rhythm.  Pulmonary:     Effort: Pulmonary effort is normal.     Breath sounds: Normal breath sounds.  Skin:    General: Skin is warm and dry.  Neurological:     Mental Status: She is alert.      Musculoskeletal Exam:  Shoulders full ROM no tenderness or swelling Fingers full ROM no tenderness or swelling Left hip severely limited internal rotation with pain Knees full ROM, no focal tenderness, no obvious palpable effusion Ankles full ROM no tenderness or swelling  Limited musculoskeletal ultrasound exam of the right knee there is a very trace increase in joint fluid and consistent with mild degenerative changes  Investigation: No additional findings.  Imaging: No results found.  Recent Labs: Lab Results  Component Value Date   WBC 8.4 02/14/2023   HGB 10.2 (L) 02/14/2023   PLT 190 02/14/2023   NA 139 06/13/2023   K 4.1 06/13/2023   CL 108 06/13/2023   CO2 23 06/13/2023   GLUCOSE 100 (H) 06/13/2023   BUN 13 06/13/2023   CREATININE 0.56 06/13/2023   BILITOT 0.4 12/27/2022   ALKPHOS 73 12/27/2022   AST 14 12/27/2022   ALT 11 12/27/2022   PROT 7.1 12/27/2022   ALBUMIN 4.2 12/27/2022   CALCIUM 8.9 06/13/2023   GFRAA >90 03/28/2014    Speciality Comments: No specialty comments available.  Procedures:  No procedures performed Allergies: Patient has no known allergies.   Assessment / Plan:     Visit Diagnoses: Rheumatoid arthritis involving multiple sites with positive rheumatoid factor (HCC) Chronic pain of both knees  Positive rheumatoid factor there is a trace joint effusion but with normal inflammatory labs and also has degenerative  arthritis that could account for her symptoms.  So I recommend symptomatic treatment for arthritis and additional workup rather than initiating any long-term RA specific DMARD.  Does not have any large effusion amenable for aspiration today this would be very helpful if she sees an increase in swelling to confirm an inflammatory synovial fluid.  She is not interested in trial of intra-articular injection today.  I recommended addition of duloxetine along with NSAIDs for knee pain turns out this was already recommended to begin low-dose from her psychiatrist, so would have dual indications for pain and depression symptoms.  With the recurring knee synovitis despite mild degenerative changes for this longer duration I recommend we get a right knee MRI that could also reveal if there is more underlying structural damage or if this looks consistent with chronic synovitis.   Orders: No orders of the defined types were placed in this encounter.  No orders of the defined types were placed in this encounter.    Follow-Up Instructions: No follow-ups on file.   Fuller Plan, MD  Note - This record has been created using [employer].  Chart creation errors have been sought, but may not always  have been located. Such creation errors do not reflect on  the standard of medical care.
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
## 2023-06-27 NOTE — Progress Notes (Unsigned)
Assessment/Plan:   Problem List Items Addressed This Visit   None   There are no discontinued medications.  No follow-ups on file.    Subjective:   Encounter date: 06/27/2023  Renee Cross is a 47 y.o. female who has Amblyopia of left eye; Anemia; Rheumatoid arthritis (HCC); Abnormal uterine bleeding (AUB); Severe persistent asthma with exacerbation; Hip arthritis; Gastroesophageal reflux disease; Perennial allergic rhinitis with seasonal variation; Preoperative evaluation to rule out surgical contraindication; Overuse of medication; Primary hypertension; Alcohol use disorder; Financial difficulty; Episode of recurrent major depressive disorder (HCC); B12 deficiency; History of suicide attempt; Non-recurrent acute suppurative otitis media of left ear without spontaneous rupture of tympanic membrane; and Tobacco use disorder on their problem list..   She  has a past medical history of Anemia, Arthritis, Asthma, Depression, GERD (gastroesophageal reflux disease), Hiatal hernia with GERD, Hypertension, Pneumonia, PTSD (post-traumatic stress disorder), and Sickle cell trait (HCC)..   She presents with chief complaint of Medical Management of Chronic Issues (Fasting. 4 weeks (around 06/27/2023) for fasting labs 2 weeks, BP.) .     05/16/2023   11:54 AM 01/31/2023    1:41 PM 12/22/2022    3:32 PM  Depression screen PHQ 2/9  Decreased Interest 2  2  Down, Depressed, Hopeless 1  1  PHQ - 2 Score 3  3  Altered sleeping 2  2  Tired, decreased energy 2  2  Change in appetite 2  1  Feeling bad or failure about yourself  2  2  Trouble concentrating 0  1  Moving slowly or fidgety/restless 3  3  Suicidal thoughts 0  0  PHQ-9 Score 14  14  Difficult doing work/chores Very difficult  Very difficult     Information is confidential and restricted. Go to Review Flowsheets to unlock data.      05/16/2023   11:54 AM 01/31/2023    1:42 PM 12/22/2022    3:32 PM  GAD 7 : Generalized Anxiety Score   Nervous, Anxious, on Edge 0  0  Control/stop worrying 2  2  Worry too much - different things 2  2  Trouble relaxing 3  2  Restless 2  0  Easily annoyed or irritable 1  1  Afraid - awful might happen 3  0  Total GAD 7 Score 13  7  Anxiety Difficulty Somewhat difficult  Very difficult     Information is confidential and restricted. Go to Review Flowsheets to unlock data.     HPI:   ROS  Past Surgical History:  Procedure Laterality Date   HERNIA REPAIR     SINUS EXPLORATION     STOMACH SURGERY  10/03/2001   Possible fundiplication?   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 75 mL 1   albuterol (VENTOLIN HFA) 108 (90 Base) MCG/ACT inhaler 2 puffs every 4-6 hours as needed for cough or wheeze. 18 g 1   amLODipine-olmesartan (AZOR) 10-40 MG tablet Take 1 tablet by mouth daily. 90 tablet 0   cetirizine (ZYRTEC) 10 MG tablet Take 1 tablet (  10 mg total) by mouth daily. 1 po q day prn allergies (Patient taking differently: Take 10 mg by mouth at bedtime.) 30 tablet 0   cyanocobalamin (VITAMIN B12) 1000 MCG tablet Take 1 tablet (1,000 mcg total) by mouth daily. 90 tablet 3   esomeprazole (NEXIUM) 40 MG capsule Take 1 capsule (40 mg total) by mouth daily. 30 capsule 3   ferrous sulfate 325 (65 FE) MG EC tablet Take 325 mg by mouth daily.     fluticasone (FLONASE) 50 MCG/ACT nasal spray Place 2 sprays into both nostrils every morning. 2 sprays per nostril every day for stuffy nose or drainage. 48 g 1   Fluticasone-Umeclidin-Vilant (TRELEGY ELLIPTA) 200-62.5-25  MCG/ACT AEPB Inhale 1 puff into the lungs in the morning. 180 each 1   ipratropium-albuterol (DUONEB) 0.5-2.5 (3) MG/3ML SOLN TAKE 3 MLS BY NEBULIZATION EVERY 4 (FOUR) HOURS AS NEEDED. 360 mL 0   loratadine (CLARITIN) 10 MG tablet Take 1 tablet (10 mg total) by mouth 2 (two) times daily. 180 tablet 1   medroxyPROGESTERone (PROVERA) 10 MG tablet Take 1 tablet by mouth daily.     meloxicam (MOBIC) 15 MG tablet Take 1 tablet (15 mg total) by mouth daily. For 2 weeks for pain and inflammation. Then take as needed 30 tablet 0   methocarbamol (ROBAXIN) 500 MG tablet Take 1 tablet (500 mg total) by mouth every 8 (eight) hours as needed for muscle spasms. 30 tablet 0   mometasone (ELOCON) 0.1 % ointment Apply topically daily. Apply once daily to red itchy areas or until resolved 135 g 1   montelukast (SINGULAIR) 10 MG tablet Take 1 tablet (10 mg total) by mouth at bedtime. 30 tablet 3   Multiple Vitamin (MULTIVITAMIN WITH MINERALS) TABS tablet Take 1 tablet by mouth daily.     Olopatadine HCl (PATADAY) 0.2 % SOLN Place 1 drop into both eyes daily as needed (allergies). 2.5 mL 3   omeprazole (PRILOSEC) 40 MG capsule Take 1 capsule (40 mg total) by mouth every morning. 90 capsule 1   promethazine-dextromethorphan (PROMETHAZINE-DM) 6.25-15 MG/5ML syrup Take 5 mLs by mouth 4 (four) times daily as needed. 118 mL 0   sennosides-docusate sodium (SENOKOT-S) 8.6-50 MG tablet Take 2 tablets by mouth daily. To help prevent constipation while taking pain medication 30 tablet 0   DULoxetine (CYMBALTA) 30 MG capsule Take 1 capsule (30 mg total) by mouth daily. 30 capsule 0   No facility-administered medications prior to visit.    No family history on file.  Social History   Socioeconomic History   Marital status: Single    Spouse name: Not on file   Number of children: Not on file   Years of education: Not on file   Highest education level: Not on file  Occupational History   Not on file  Tobacco Use    Smoking status: Former    Current packs/day: 0.00    Average packs/day: 1 pack/day for 32.0 years (32.0 ttl pk-yrs)    Types: Cigarettes    Start date: 51    Quit date: 2024    Years since quitting: 0.7    Passive exposure: Never   Smokeless tobacco: Never   Tobacco comments:    Smokes black and mild occassionally  Vaping Use   Vaping status: Never Used  Substance and Sexual Activity   Alcohol use: Yes    Alcohol/week: 2.0 standard drinks of alcohol    Types: 2 Cans of beer per week   Drug use:  Yes    Frequency: 4.0 times per week    Types: Marijuana   Sexual activity: Yes    Birth control/protection: Surgical  Other Topics Concern   Not on file  Social History Narrative   Not on file   Social Determinants of Health   Financial Resource Strain: Not on file  Food Insecurity: Not on file  Transportation Needs: Not on file  Physical Activity: Not on file  Stress: Not on file  Social Connections: Not on file  Intimate Partner Violence: Not on file                                                                                                  Objective:  Physical Exam: BP (!) 142/84 (BP Location: Left Arm, Patient Position: Sitting, Cuff Size: Large)   Pulse 79   Temp (!) 97.4 F (36.3 C) (Temporal)   Wt 203 lb 3.2 oz (92.2 kg)   SpO2 99%   BMI 32.80 kg/m     Physical Exam  XR KNEE 3 VIEW LEFT  Result Date: 06/03/2023 X-ray left knee 3 views Medial lateral compartment joint space appears normal.  Patellofemoral joint appears normal.  Patellar structure ring and or small inferior osteophyte present.  No erosions abnormal calcifications or visible effusions. Impression Normal x-ray  XR KNEE 3 VIEW RIGHT  Result Date: 06/03/2023 X-ray right knee 3 views Some mild appearing osteoarthritis with some narrowing at the medial edge of joint and osteophyte or enthesophyte process at the tibiofibular joint.  Small inferior patellar osteophyte.  May be small joint  effusion or other soft tissue swelling noted at medial edge of joint.  No erosions or abnormal calcifications seen. Impression Mild appearing multicompartmental osteoarthritis possible small effusion no acute appearing abnormality   Recent Results (from the past 2160 hour(s))  POC COVID-19 BinaxNow     Status: None   Collection Time: 05/16/23 11:11 AM  Result Value Ref Range   SARS Coronavirus 2 Ag Negative Negative  POCT Influenza A/B     Status: None   Collection Time: 05/16/23 11:12 AM  Result Value Ref Range   Influenza A, POC Negative Negative   Influenza B, POC Negative Negative  Rheumatoid factor     Status: Abnormal   Collection Time: 05/31/23  9:08 AM  Result Value Ref Range   Rheumatoid fact SerPl-aCnc 39 (H) <14 IU/mL  Sedimentation rate     Status: None   Collection Time: 05/31/23  9:08 AM  Result Value Ref Range   Sed Rate 6 0 - 20 mm/h  C-reactive protein     Status: None   Collection Time: 05/31/23  9:08 AM  Result Value Ref Range   CRP 4.0 <8.0 mg/L  Mutated Citrullinated Vimentin (MCV) Antibody     Status: None   Collection Time: 05/31/23  9:08 AM  Result Value Ref Range   MUTATED CITRULLINATED VIMENTIN (MCV) AB <20 <20 U/mL    Comment: . Anti-mutated citrullinated vimentin antibody may be used as a second-line marker of rheumatoid arthritis, in addition to rheumatoid factor and anti-cyclic citrullinated peptide (CCP). Marland Kitchen  Basic Metabolic Panel (BMET)     Status: Abnormal   Collection Time: 06/13/23 10:55 AM  Result Value Ref Range   Sodium 139 135 - 145 mEq/L   Potassium 4.1 3.5 - 5.1 mEq/L   Chloride 108 96 - 112 mEq/L   CO2 23 19 - 32 mEq/L   Glucose, Bld 100 (H) 70 - 99 mg/dL   BUN 13 6 - 23 mg/dL   Creatinine, Ser 9.14 0.40 - 1.20 mg/dL   GFR 782.95 >62.13 mL/min    Comment: Calculated using the CKD-EPI Creatinine Equation (2021)   Calcium 8.9 8.4 - 10.5 mg/dL        Garner Nash, MD, MS

## 2023-06-28 ENCOUNTER — Encounter: Payer: Self-pay | Admitting: Internal Medicine

## 2023-06-28 ENCOUNTER — Ambulatory Visit: Payer: 59 | Attending: Internal Medicine | Admitting: Internal Medicine

## 2023-06-28 VITALS — BP 134/84 | HR 86 | Resp 14 | Ht 66.0 in | Wt 203.0 lb

## 2023-06-28 DIAGNOSIS — M25562 Pain in left knee: Secondary | ICD-10-CM

## 2023-06-28 DIAGNOSIS — M0579 Rheumatoid arthritis with rheumatoid factor of multiple sites without organ or systems involvement: Secondary | ICD-10-CM | POA: Diagnosis not present

## 2023-06-28 DIAGNOSIS — G8929 Other chronic pain: Secondary | ICD-10-CM

## 2023-06-28 DIAGNOSIS — M25561 Pain in right knee: Secondary | ICD-10-CM

## 2023-06-28 DIAGNOSIS — M1711 Unilateral primary osteoarthritis, right knee: Secondary | ICD-10-CM

## 2023-06-28 HISTORY — DX: Pain in right knee: M25.561

## 2023-06-28 NOTE — Assessment & Plan Note (Signed)
Chronic joint pain and stiffness, especially with elevated rheumatoid factor. Plan: Continue follow-up with rheumatology for ongoing management.

## 2023-06-28 NOTE — Assessment & Plan Note (Signed)
Ongoing management of depression. Plan: Continue Cymbalta and follow up with Dr. Lolly Mustache.

## 2023-06-28 NOTE — Assessment & Plan Note (Addendum)
Asthma and allergy symptoms appear stable with the current medication regimen. Plan: Continue current medications Promethazine with dextromethorphan for cough management.

## 2023-06-28 NOTE — Assessment & Plan Note (Signed)
The patient has shown some improvement in blood pressure but still experiences elevations.  Plan: Add hydrochlorothiazide to the current regimen to enhance blood pressure control. Continue monitoring blood pressure at home. Reevaluate in 3 months.

## 2023-06-28 NOTE — Assessment & Plan Note (Signed)
The patient has previously quit cigarettes but reports occasional smoking. Plan: Reinforce the importance of smoking cessation and explore additional support if needed.

## 2023-07-02 DIAGNOSIS — M1711 Unilateral primary osteoarthritis, right knee: Secondary | ICD-10-CM | POA: Insufficient documentation

## 2023-07-02 HISTORY — DX: Unilateral primary osteoarthritis, right knee: M17.11

## 2023-07-03 ENCOUNTER — Other Ambulatory Visit: Payer: Self-pay | Admitting: *Deleted

## 2023-07-03 DIAGNOSIS — M1711 Unilateral primary osteoarthritis, right knee: Secondary | ICD-10-CM

## 2023-07-03 DIAGNOSIS — G8929 Other chronic pain: Secondary | ICD-10-CM

## 2023-07-04 DIAGNOSIS — N939 Abnormal uterine and vaginal bleeding, unspecified: Secondary | ICD-10-CM | POA: Diagnosis not present

## 2023-07-04 DIAGNOSIS — D500 Iron deficiency anemia secondary to blood loss (chronic): Secondary | ICD-10-CM | POA: Diagnosis not present

## 2023-07-04 DIAGNOSIS — N920 Excessive and frequent menstruation with regular cycle: Secondary | ICD-10-CM | POA: Diagnosis not present

## 2023-07-04 DIAGNOSIS — I10 Essential (primary) hypertension: Secondary | ICD-10-CM | POA: Diagnosis not present

## 2023-07-04 DIAGNOSIS — Z30431 Encounter for routine checking of intrauterine contraceptive device: Secondary | ICD-10-CM | POA: Diagnosis not present

## 2023-07-11 ENCOUNTER — Ambulatory Visit: Payer: 59 | Admitting: Neurology

## 2023-07-11 ENCOUNTER — Encounter: Payer: Self-pay | Admitting: Neurology

## 2023-07-11 VITALS — BP 128/84 | HR 91 | Ht 66.0 in | Wt 197.2 lb

## 2023-07-11 DIAGNOSIS — Z9189 Other specified personal risk factors, not elsewhere classified: Secondary | ICD-10-CM | POA: Diagnosis not present

## 2023-07-11 DIAGNOSIS — R0683 Snoring: Secondary | ICD-10-CM

## 2023-07-11 DIAGNOSIS — R351 Nocturia: Secondary | ICD-10-CM

## 2023-07-11 DIAGNOSIS — G4719 Other hypersomnia: Secondary | ICD-10-CM

## 2023-07-11 DIAGNOSIS — E66811 Obesity, class 1: Secondary | ICD-10-CM | POA: Diagnosis not present

## 2023-07-11 DIAGNOSIS — R0681 Apnea, not elsewhere classified: Secondary | ICD-10-CM

## 2023-07-11 DIAGNOSIS — R519 Headache, unspecified: Secondary | ICD-10-CM

## 2023-07-11 NOTE — Progress Notes (Signed)
Subjective:    Patient ID: Renee Cross is a 47 y.o. female.  HPI    Huston Foley, MD, PhD [facility] [address] [address] [place], [place] [address]  Dear Dr. Janee Morn,  I saw your patient, Renee Cross, upon your kind request in my sleep clinic today for initial consultation of her sleep disorder, in particular, concern for underlying obstructive sleep apnea.  The patient is unaccompanied today.  As you know, Renee Cross is a 47 year old female with an underlying medical history of hypertension, reflux disease, allergic rhinitis, smoking, asthma, anemia, sickle cell trait, PTSD (by chart review) and mild obesity, who reports snoring and excessive daytime somnolence, as well as witnessed apneic pauses per daughter's report.  Her Epworth sleepiness score is 15 out of 24, fatigue severity score is 53 out of 63. I reviewed your office note from 05/30/2023.  She has a couple of cousins with sleep apnea.  She currently lives with her daughter who is helping her after her regular placement.  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  She has a variable bedtime and rise time.  She is typically in bed before midnight, rise time can be anywhere between 5 AM and 8 AM.  She currently does not work.  They have 1 dog in the household.  She has a TV on in her bedroom typically but tries to turn it off or put it on a sleep timer.  She has some difficulty falling asleep and maintaining sleep, this has been going on for many years, about 5 to 10 years.  She was recently placed on Seroquel.  She quit smoking cigarettes but smokes cigars daily, about 2/day.  She drinks caffeine in the form of tea and soda, 1 or 2/week, not daily.  She does not currently drink any alcohol.  She has occasional morning headaches and has nocturia about twice per average night.    Her Past Medical History Is Significant For: Past Medical  History:  Diagnosis Date   Anemia    Arthritis    Asthma    Depression    GERD (gastroesophageal reflux disease)    Hiatal hernia with GERD    Hypertension    Pneumonia    PTSD (post-traumatic stress disorder)    Sickle cell trait (HCC)     Her Past Surgical History Is Significant For: Past Surgical History:  Procedure Laterality Date   HERNIA REPAIR     SINUS EXPLORATION     STOMACH SURGERY  10/03/2001   Possible fundiplication?   TONSILLECTOMY     TOTAL HIP ARTHROPLASTY Right 02/14/2023   Procedure: TOTAL HIP ARTHROPLASTY ANTERIOR APPROACH;  Surgeon: Sheral Apley, MD;  Location: [facility];  Service: Orthopedics;  Laterality: Right;   TUBAL LIGATION      Her Family History Is Significant For: No family history on file.  Her Social History Is Significant For: Social History   Socioeconomic History   Marital status: Single    Spouse name: Not on file   Number of children: Not on file   Years of education: Not on file   Highest education level: Not on file  Occupational History   Not on file  Tobacco Use   Smoking status: Former    Types: Cigars    Passive exposure: Never   Smokeless tobacco: Never   Tobacco comments:    Smokes black and mild  occassionally  Vaping Use   Vaping status: Never Used  Substance and Sexual Activity   Alcohol use: Yes    Alcohol/week: 2.0 standard drinks of alcohol    Types: 2 Cans of beer per week   Drug use: Yes    Frequency: 4.0 times per week    Types: Marijuana    Comment: edibles   Sexual activity: Yes    Birth control/protection: Surgical  Other Topics Concern   Not on file  Social History Narrative   Caffiene 2 days / week 2 cans soda   Works not working.    Social Determinants of Health   Financial Resource Strain: Not on file  Food Insecurity: Not on file  Transportation Needs: Not on file  Physical Activity: Not on file  Stress: Not on file  Social Connections: Not on file    Her Allergies Are:  No Known  Allergies:   Her Current Medications Are:  Outpatient Encounter Medications as of 07/11/2023  Medication Sig   albuterol (PROVENTIL) (2.5 MG/3ML) 0.083% nebulizer solution Take 3 mLs (2.5 mg total) by nebulization every 4 (four) hours as needed for wheezing or shortness of breath.   albuterol (VENTOLIN HFA) 108 (90 Base) MCG/ACT inhaler 2 puffs every 4-6 hours as needed for cough or wheeze.   cetirizine (ZYRTEC) 10 MG tablet Take 1 tablet (10 mg total) by mouth daily. 1 po q day prn allergies (Patient taking differently: Take 10 mg by mouth at bedtime.)   cyanocobalamin (VITAMIN B12) 1000 MCG tablet Take 1 tablet (1,000 mcg total) by mouth daily.   DULoxetine (CYMBALTA) 30 MG capsule Take 1 capsule (30 mg total) by mouth daily.   esomeprazole (NEXIUM) 40 MG capsule Take 1 capsule (40 mg total) by mouth daily.   ferrous sulfate 325 (65 FE) MG EC tablet Take 325 mg by mouth daily.   fluticasone (FLONASE) 50 MCG/ACT nasal spray Place 2 sprays into both nostrils every morning. 2 sprays per nostril every day for stuffy nose or drainage.   Fluticasone-Umeclidin-Vilant (TRELEGY ELLIPTA) 200-62.5-25 MCG/ACT AEPB Inhale 1 puff into the lungs in the morning.   ipratropium-albuterol (DUONEB) 0.5-2.5 (3) MG/3ML SOLN TAKE 3 MLS BY NEBULIZATION EVERY 4 (FOUR) HOURS AS NEEDED.   IRON, FERROUS GLUCONATE, PO Take by mouth.   loratadine (CLARITIN) 10 MG tablet Take 1 tablet (10 mg total) by mouth 2 (two) times daily.   medroxyPROGESTERone (PROVERA) 10 MG tablet Take 1 tablet by mouth daily.   meloxicam (MOBIC) 15 MG tablet Take 1 tablet (15 mg total) by mouth daily. For 2 weeks for pain and inflammation. Then take as needed   methocarbamol (ROBAXIN) 500 MG tablet Take 1 tablet (500 mg total) by mouth every 8 (eight) hours as needed for muscle spasms.   mometasone (ELOCON) 0.1 % ointment Apply topically daily. Apply once daily to red itchy areas or until resolved   montelukast (SINGULAIR) 10 MG tablet Take 1 tablet  (10 mg total) by mouth at bedtime.   Multiple Vitamin (MULTIVITAMIN WITH MINERALS) TABS tablet Take 1 tablet by mouth daily.   Olmesartan-amLODIPine-HCTZ 40-10-25 MG TABS Take 1 tablet by mouth daily.   Olopatadine HCl (PATADAY) 0.2 % SOLN Place 1 drop into both eyes daily as needed (allergies).   omeprazole (PRILOSEC) 40 MG capsule Take 1 capsule (40 mg total) by mouth every morning.   promethazine-dextromethorphan (PROMETHAZINE-DM) 6.25-15 MG/5ML syrup Take 5 mLs by mouth 4 (four) times daily as needed.   QUEtiapine (SEROQUEL) 25 MG tablet Take  1 tablet (25 mg total) by mouth at bedtime.   sennosides-docusate sodium (SENOKOT-S) 8.6-50 MG tablet Take 2 tablets by mouth daily. To help prevent constipation while taking pain medication   No facility-administered encounter medications on file as of 07/11/2023.  :   Review of Systems:  Out of a complete 14 point review of systems, all are reviewed and negative with the exception of these symptoms as listed below:  Review of Systems  Neurological:        NP / Internal / snoring, obesity, problems falling alseep, wakes gasping,  monring headaches.  OSA eval / THOMPSON, AARON B - No prior SS.  ESS  15   FSS  53    Objective:  Neurological Exam  Physical Exam Physical Examination:   Vitals:   07/11/23 1409  BP: 128/84  Pulse: 91    General Examination: The patient is a very pleasant 47 y.o. female in no acute distress. She appears well-developed and well-nourished and well groomed.   HEENT: Normocephalic, atraumatic, pupils are equal, round and reactive to light, extraocular tracking is good without limitation to gaze excursion or nystagmus noted. Hearing is grossly intact. Face is symmetric with normal facial animation. Speech is clear with no dysarthria noted. There is no hypophonia. There is no lip, neck/head, jaw or voice tremor. Neck is supple with full range of passive and active motion. There are no carotid bruits on auscultation.  Oropharynx exam reveals: mild mouth dryness, adequate dental hygiene and mild airway crowding, due to small airway entry, tonsils absent, larger tongue, Mallampati class III.  Neck circumference 14-1/2 inches.  Minimal overbite noted.  Tongue protrudes centrally and palate elevates symmetrically.    Chest: Clear to auscultation without wheezing, rhonchi or crackles noted.  Heart: S1+S2+0, regular and normal without murmurs, rubs or gallops noted.   Abdomen: Soft, non-tender and non-distended.  Extremities: There is no pitting edema in the distal lower extremities bilaterally.   Skin: Warm and dry without trophic changes noted.   Musculoskeletal: exam reveals no obvious joint deformities.   Neurologically:  Mental status: The patient is awake, alert and oriented in all 4 spheres. Her immediate and remote memory, attention, language skills and fund of knowledge are appropriate. There is no evidence of aphasia, agnosia, apraxia or anomia. Speech is clear with normal prosody and enunciation. Thought process is linear. Mood is normal and affect is normal.  Cranial nerves II - XII are as described above under HEENT exam.  Motor exam: Normal bulk, strength and tone is noted. There is no obvious action or resting tremor.  Fine motor skills and coordination: grossly intact.  Cerebellar testing: No dysmetria or intention tremor. There is no truncal or gait ataxia.  Sensory exam: intact to light touch in the upper and lower extremities.  Gait, station and balance: She stands slightly slowly and with mild difficulty, she has a slight limp on the left.  No walking aids, no shuffling noted, preserved arm swing.  Assessment and Plan:  In summary, Seleta Hovland is a very pleasant 47 y.o.-year old female with an underlying medical history of hypertension, reflux disease, allergic rhinitis, prior smoking, asthma, anemia, sickle cell trait, arthritis with status post right total knee replacement in May 2024,  PTSD (by chart review) and mild obesity, whose history and physical exam are concerning for sleep disordered breathing, particularlys obstructive sleep apnea (OSA).  A laboratory attended sleep study is typically considered "gold standard" for evaluation of sleep disordered breathing.   I  had a long chat with the patient about my findings and the diagnosis of sleep apnea, particularly OSA, its prognosis and treatment options. We talked about medical/conservative treatments, surgical interventions and non-pharmacological approaches for symptom control. I explained, in particular, the risks and ramifications of untreated moderate to severe OSA, especially with respect to developing cardiovascular disease down the road, including congestive heart failure (CHF), difficult to treat hypertension, cardiac arrhythmias (particularly A-fib), neurovascular complications including TIA, stroke and dementia. Even type 2 diabetes has, in part, been linked to untreated OSA. Symptoms of untreated OSA may include (but may not be limited to) daytime sleepiness, nocturia (i.e. frequent nighttime urination), memory problems, mood irritability and suboptimally controlled or worsening mood disorder such as depression and/or anxiety, lack of energy, lack of motivation, physical discomfort, as well as recurrent headaches, especially morning or nocturnal headaches. We talked about the importance of maintaining a healthy lifestyle and striving for healthy weight.  The importance of complete smoking cessation was also addressed.  In addition, we talked about the importance of striving for and maintaining good sleep hygiene.  She is encouraged to keep a schedule with her sleep and wake routine. I recommended a sleep study at this time. I outlined the differences between a laboratory attended sleep study which is considered more comprehensive and accurate over the option of a home sleep test (HST); the latter may lead to underestimation of  sleep disordered breathing in some instances and does not help with diagnosing upper airway resistance syndrome and is not accurate enough to diagnose primary central sleep apnea typically. I outlined possible surgical and non-surgical treatment options of OSA, including the use of a positive airway pressure (PAP) device (i.e. CPAP, AutoPAP/APAP or BiPAP in certain circumstances), a custom-made dental device (aka oral appliance, which would require a referral to a specialist dentist or orthodontist typically, and is generally speaking not considered for patients with full dentures or edentulous state), upper airway surgical options, such as traditional UPPP (which is not considered a first-line treatment) or the Inspire device (hypoglossal nerve stimulator, which would involve a referral for consultation with an ENT surgeon, after careful selection, following inclusion criteria - also not first-line treatment). I explained the PAP treatment option to the patient in detail, as this is generally considered first-line treatment.  The patient indicated that she would be willing to try PAP therapy, if the need arises. I explained the importance of being compliant with PAP treatment, not only for insurance purposes but primarily to improve patient's symptoms symptoms, and for the patient's long term health benefit, including to reduce Her cardiovascular risks longer-term.    We will pick up our discussion about the next steps and treatment options after testing.  We will keep her posted as to the test results by phone call and/or MyChart messaging where possible.  We will plan to follow-up in sleep clinic accordingly as well.  I answered all her questions today and the patient was in agreement.   I encouraged her to call with any interim questions, concerns, problems or updates or email [place] through MyChart.  Generally speaking, sleep test authorizations may take up to 2 weeks, sometimes less, sometimes longer, the  patient is encouraged to get in touch with [place] if they do not hear back from the sleep lab staff directly within the next 2 weeks.  Thank you very much for allowing me to participate in the care of this nice patient. If I can be of any further assistance to you  please do not hesitate to call me at [phone].  Sincerely,   Huston Foley, MD, PhD

## 2023-07-11 NOTE — Patient Instructions (Signed)

## 2023-07-13 DIAGNOSIS — D509 Iron deficiency anemia, unspecified: Secondary | ICD-10-CM | POA: Diagnosis not present

## 2023-07-13 DIAGNOSIS — K219 Gastro-esophageal reflux disease without esophagitis: Secondary | ICD-10-CM | POA: Diagnosis not present

## 2023-07-18 ENCOUNTER — Telehealth: Payer: Self-pay | Admitting: Neurology

## 2023-07-18 NOTE — Telephone Encounter (Signed)
NPSG- [employer] pending uploaded notes.  HST- [employer] no auth req.

## 2023-07-20 NOTE — Telephone Encounter (Signed)
Checked status on the portal it is still pending.  

## 2023-07-24 NOTE — Telephone Encounter (Signed)
Checked status it is still pending.  

## 2023-07-26 ENCOUNTER — Telehealth: Payer: Self-pay

## 2023-07-26 ENCOUNTER — Telehealth: Payer: Self-pay | Admitting: Internal Medicine

## 2023-07-26 NOTE — Telephone Encounter (Signed)
Pt called stating she missed a phone call earlier this morning but there was no voice mail left.

## 2023-07-26 NOTE — Telephone Encounter (Signed)
Refer to previous note.

## 2023-07-26 NOTE — Telephone Encounter (Signed)
Attempted to contact the patient and left message for patient advising we left a message this morning to advise her to contact [facility] to schedule her appointment for her MRI. Provided patient with number.

## 2023-07-26 NOTE — Telephone Encounter (Signed)
Ok thank you. I will reach out to Tammy to have her PA for the Tezepelumab.

## 2023-07-26 NOTE — Telephone Encounter (Signed)
Patient stating you wanted  her to start back on her biologic. You saw her back in May and said you would look into Tezepelumab. Her last Fasenra injection was May/23. We do have a fasenra here in [facility]. Which biologic do you want her on? The Tezepelumab or ok to give her the Harrington Challenger?

## 2023-07-26 NOTE — Telephone Encounter (Signed)
Analise, Domin saw Dr. Lucie Leather in May and wanted her to restart her back on her biologic. She was on [place] but her last injection of that was 01/2022. Dr.Kozlow wants to be on Tezepelumab [employer]) once approved please schedule [place]. Will let patient know you will be working on this. Thank you.

## 2023-07-31 NOTE — Telephone Encounter (Signed)
Checked the status on the portal it is still pending.  

## 2023-07-31 NOTE — Telephone Encounter (Signed)
Spoke to Dr Lucie Leather and advised of message from July patient was denied Tezspire due to plan exclusion but approved and delivered for Nucala. Appt made to start therapy and she advised new [employer] ins but will need to work through d/c current coverage and advise [employer] in order to try and get same through [employer] so she will start Nucala we have on hand.

## 2023-08-02 NOTE — Telephone Encounter (Signed)
Checked status it is still pending.  

## 2023-08-04 ENCOUNTER — Ambulatory Visit (INDEPENDENT_AMBULATORY_CARE_PROVIDER_SITE_OTHER): Payer: 59

## 2023-08-04 DIAGNOSIS — Z419 Encounter for procedure for purposes other than remedying health state, unspecified: Secondary | ICD-10-CM | POA: Diagnosis not present

## 2023-08-04 DIAGNOSIS — J4550 Severe persistent asthma, uncomplicated: Secondary | ICD-10-CM | POA: Diagnosis not present

## 2023-08-04 MED ORDER — MEPOLIZUMAB 100 MG/ML ~~LOC~~ SOSY
100.0000 mg | PREFILLED_SYRINGE | SUBCUTANEOUS | Status: DC
Start: 2023-08-04 — End: 2023-10-23
  Administered 2023-08-04 – 2023-09-08 (×2): 100 mg via SUBCUTANEOUS

## 2023-08-04 NOTE — Progress Notes (Signed)
Immunotherapy   Patient Details  Name: Nickol Collister MRN: [id number] Date of Birth: 03-21-1976  08/04/2023  [name] started injections for  Nucala  Frequency: Q 4 weeks Consent signed and patient instructions given. No problems after 30 min wait.   Kalenna Millett J Raziyah Vanvleck 08/04/2023, 3:17 PM

## 2023-08-05 ENCOUNTER — Other Ambulatory Visit: Payer: Self-pay | Admitting: Family Medicine

## 2023-08-05 DIAGNOSIS — J4551 Severe persistent asthma with (acute) exacerbation: Secondary | ICD-10-CM

## 2023-08-07 ENCOUNTER — Ambulatory Visit (INDEPENDENT_AMBULATORY_CARE_PROVIDER_SITE_OTHER): Payer: 59 | Admitting: Family Medicine

## 2023-08-07 ENCOUNTER — Encounter: Payer: Self-pay | Admitting: Family Medicine

## 2023-08-07 VITALS — BP 122/80 | HR 94 | Temp 97.9°F | Wt 202.0 lb

## 2023-08-07 DIAGNOSIS — J4551 Severe persistent asthma with (acute) exacerbation: Secondary | ICD-10-CM | POA: Diagnosis not present

## 2023-08-07 DIAGNOSIS — H538 Other visual disturbances: Secondary | ICD-10-CM | POA: Diagnosis not present

## 2023-08-07 DIAGNOSIS — F17200 Nicotine dependence, unspecified, uncomplicated: Secondary | ICD-10-CM

## 2023-08-07 DIAGNOSIS — H53002 Unspecified amblyopia, left eye: Secondary | ICD-10-CM

## 2023-08-07 LAB — POCT INFLUENZA A/B
Influenza A, POC: NEGATIVE
Influenza B, POC: NEGATIVE

## 2023-08-07 LAB — POC COVID19 BINAXNOW: SARS Coronavirus 2 Ag: NEGATIVE

## 2023-08-07 MED ORDER — PREDNISONE 20 MG PO TABS
40.0000 mg | ORAL_TABLET | Freq: Every day | ORAL | 0 refills | Status: AC
Start: 2023-08-07 — End: 2023-08-12

## 2023-08-07 MED ORDER — PROMETHAZINE-DM 6.25-15 MG/5ML PO SYRP
5.0000 mL | ORAL_SOLUTION | Freq: Four times a day (QID) | ORAL | 0 refills | Status: DC | PRN
Start: 2023-08-07 — End: 2023-08-25

## 2023-08-07 MED ORDER — KETOROLAC TROMETHAMINE 60 MG/2ML IM SOLN
60.0000 mg | Freq: Once | INTRAMUSCULAR | Status: AC
  Administered 2023-08-07: 60 mg via INTRAMUSCULAR

## 2023-08-07 MED ORDER — BENZONATATE 200 MG PO CAPS: 200.0000 mg | ORAL_CAPSULE | Freq: Two times a day (BID) | ORAL | 0 refills | Status: DC | PRN

## 2023-08-07 NOTE — Progress Notes (Signed)
Assessment/Plan:   Problem List Items Addressed This Visit       Respiratory   Severe persistent asthma with exacerbation - Primary    Chronic Condition Status: Worsening. Likely viral induced Plan: Promethazine with Dextromethorphan syrup Benzonatate (Tessalon Perles) for additional cough relief. Prednisone 40 mg for 5 days  Continue using Albuterol inhaler as needed. Encouraged to avoid smoking to improve respiratory health. Torodol 60 mg IM x1 for headache Follow up as needed      Relevant Medications   promethazine-dextromethorphan (PROMETHAZINE-DM) 6.25-15 MG/5ML syrup   predniSONE (DELTASONE) 20 MG tablet   benzonatate (TESSALON) 200 MG capsule   Other Relevant Orders   POC COVID-19 BinaxNow (Completed)   POCT Influenza A/B (Completed)     Other   Amblyopia of left eye    Chronic. With blurred vision. Referral to ophthalmology       Relevant Orders   Ambulatory referral to Ophthalmology   Tobacco use disorder   Other Visit Diagnoses     Blurry vision, bilateral       Relevant Orders   Ambulatory referral to Ophthalmology       Medications Discontinued During This Encounter  Medication Reason   promethazine-dextromethorphan (PROMETHAZINE-DM) 6.25-15 MG/5ML syrup Reorder    Return if symptoms worsen or fail to improve.    Subjective:   Encounter date: 08/07/2023  Renee Cross is a 47 y.o. female who has Amblyopia of left eye; Anemia; Rheumatoid arthritis (HCC); Abnormal uterine bleeding (AUB); Severe persistent asthma with exacerbation; Hip arthritis; Gastroesophageal reflux disease; Perennial allergic rhinitis with seasonal variation; Preoperative evaluation to rule out surgical contraindication; Overuse of medication; Primary hypertension; Alcohol use disorder; Financial difficulty; Episode of recurrent major depressive disorder (HCC); B12 deficiency; History of suicide attempt; Non-recurrent acute suppurative otitis media of left ear without  spontaneous rupture of tympanic membrane; Tobacco use disorder; Bilateral knee pain; and Osteoarthritis of right knee on their problem list..   She  has a past medical history of Anemia, Arthritis, Asthma, Depression, GERD (gastroesophageal reflux disease), Hiatal hernia with GERD, Hypertension, Pneumonia, PTSD (post-traumatic stress disorder), and Sickle cell trait (HCC)..   Chief Complaint: Cough, headache, chills, wheezing, and chest pain, mainly at night. History of Present Illness: Symptoms started last Thursday and have worsened over the past 4 days. Patient has a history of severe asthma and is currently on immunotherapy for asthma, Trelegy, and allergy management.  Reports increased use of albuterol inhaler. She hass occasional chest pain at night associated with coughing and wheezing.  Blurry vision.  Chronic which the patient attributes to aging and the presence of left amblyopia OCCASIONAL headache managed with over-the-counter reading glasses.  Tobacco abuse.  Has been reducing smoking, admits to occasional use.    Past Surgical History:  Procedure Laterality Date   HERNIA REPAIR     SINUS EXPLORATION     STOMACH SURGERY  10/03/2001   Possible fundiplication?   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 75 mL 1   albuterol (VENTOLIN HFA) 108 (90 Base) MCG/ACT inhaler 2 puffs every 4-6  hours as needed for cough or wheeze. 18 g 1   cetirizine (ZYRTEC) 10 MG tablet Take 1 tablet (10 mg total) by mouth daily. 1 po q day prn allergies (Patient taking  differently: Take 10 mg by mouth at bedtime.) 30 tablet 0   cyanocobalamin (VITAMIN B12) 1000 MCG tablet Take 1 tablet (1,000 mcg total) by mouth daily. 90 tablet 3   DULoxetine (CYMBALTA) 30 MG capsule Take 1 capsule (30 mg total) by mouth daily. 30 capsule 1   esomeprazole (NEXIUM) 40 MG capsule Take 1 capsule (40 mg total) by mouth daily. 30 capsule 3   ferrous sulfate 325 (65 FE) MG EC tablet Take 325 mg by mouth daily.     fluticasone (FLONASE) 50 MCG/ACT nasal spray Place 2 sprays into both nostrils every morning. 2 sprays per nostril every day for stuffy nose or drainage. 48 g 1   Fluticasone-Umeclidin-Vilant (TRELEGY ELLIPTA) 200-62.5-25 MCG/ACT AEPB Inhale 1 puff into the lungs in the morning. 180 each 1   ipratropium-albuterol (DUONEB) 0.5-2.5 (3) MG/3ML SOLN TAKE 3 MLS BY NEBULIZATION EVERY 4 (FOUR) HOURS AS NEEDED. 360 mL 0   IRON, FERROUS GLUCONATE, PO Take by mouth.     loratadine (CLARITIN) 10 MG tablet Take 1 tablet (10 mg total) by mouth 2 (two) times daily. 180 tablet 1   medroxyPROGESTERone (PROVERA) 10 MG tablet Take 1 tablet by mouth daily.     meloxicam (MOBIC) 15 MG tablet Take 1 tablet (15 mg total) by mouth daily. For 2 weeks for pain and inflammation. Then take as needed 30 tablet 0   methocarbamol (ROBAXIN) 500 MG tablet Take 1 tablet (500 mg total) by mouth every 8 (eight) hours as needed for muscle spasms. 30 tablet 0   mometasone (ELOCON) 0.1 % ointment Apply topically daily. Apply once daily to red itchy areas or until resolved 135 g 1   montelukast (SINGULAIR) 10 MG tablet Take 1 tablet (10 mg total) by mouth at bedtime. 30 tablet 3   Multiple Vitamin (MULTIVITAMIN WITH MINERALS) TABS tablet Take 1 tablet by mouth daily.     Olmesartan-amLODIPine-HCTZ 40-10-25 MG TABS Take 1 tablet by mouth daily. 90 tablet 3   Olopatadine HCl (PATADAY) 0.2 % SOLN Place 1 drop into both eyes daily as needed (allergies). 2.5 mL 3   omeprazole (PRILOSEC) 40 MG capsule Take 1 capsule  (40 mg total) by mouth every morning. 90 capsule 1   QUEtiapine (SEROQUEL) 25 MG tablet Take 1 tablet (25 mg total) by mouth at bedtime. 30 tablet 1   sennosides-docusate sodium (SENOKOT-S) 8.6-50 MG tablet Take 2 tablets by mouth daily. To help prevent constipation while taking pain medication 30 tablet 0   promethazine-dextromethorphan (PROMETHAZINE-DM) 6.25-15 MG/5ML syrup Take 5 mLs by mouth 4 (four) times daily as needed. (Patient not taking: Reported on 08/07/2023) 118 mL 0   Facility-Administered Medications Prior to Visit  Medication Dose Route Frequency Provider Last Rate Last Admin   mepolizumab (NUCALA) SOSY 100 mg  100 mg Subcutaneous Q28 days [name], MD   100 mg at 08/04/23 1507    No family history on file.  Social History   Socioeconomic History   Marital status: Single    Spouse name: Not on file   Number of children: Not on file   Years of education: Not on file   Highest education level: Not on file  Occupational History   Not on file  Tobacco Use   Smoking status: Former  Types: Cigars    Passive exposure: Never   Smokeless tobacco: Never   Tobacco comments:    Smokes black and mild occassionally  Vaping Use   Vaping status: Never Used  Substance and Sexual Activity   Alcohol use: Yes    Alcohol/week: 2.0 standard drinks of alcohol    Types: 2 Cans of beer per week   Drug use: Yes    Frequency: 4.0 times per week    Types: Marijuana    Comment: edibles   Sexual activity: Yes    Birth control/protection: Surgical  Other Topics Concern   Not on file  Social History Narrative   Caffiene 2 days / week 2 cans soda   Works not working.    Social Determinants of Health   Financial Resource Strain: Not on file  Food Insecurity: Not on file  Transportation Needs: Not on file  Physical Activity: Not on file  Stress: Not on file  Social Connections: Not on file  Intimate Partner Violence: Not on file                                                                                                   Objective:  Physical Exam: BP 122/80 (BP Location: Left Arm, Patient Position: Sitting, Cuff Size: Large)   Pulse 94   Temp 97.9 F (36.6 C) (Temporal)   Wt 202 lb (91.6 kg)   SpO2 99%   BMI 32.60 kg/m     Physical Exam Constitutional:      General: She is not in acute distress.    Appearance: Normal appearance. She is not ill-appearing or toxic-appearing.  HENT:     Head: Normocephalic and atraumatic.     Nose: Congestion present.  Eyes:     General: No scleral icterus.    Extraocular Movements: Extraocular movements intact.  Cardiovascular:     Rate and Rhythm: Normal rate and regular rhythm.     Pulses: Normal pulses.     Heart sounds: Normal heart sounds.  Pulmonary:     Effort: Pulmonary effort is normal. No respiratory distress.     Breath sounds: Normal breath sounds.  Abdominal:     General: Abdomen is flat. Bowel sounds are normal.     Palpations: Abdomen is soft.  Musculoskeletal:        General: Normal range of motion.  Lymphadenopathy:     Cervical: No cervical adenopathy.  Skin:    General: Skin is warm and dry.     Findings: No rash.  Neurological:     General: No focal deficit present.     Mental Status: She is alert and oriented to person, place, and time. Mental status is at baseline.  Psychiatric:        Mood and Affect: Mood normal.        Behavior: Behavior normal.        Thought Content: Thought content normal.        Judgment: Judgment normal.     XR KNEE 3 VIEW LEFT  Result Date: 06/03/2023 X-ray left knee 3 views Medial lateral compartment  joint space appears normal.  Patellofemoral joint appears normal.  Patellar structure ring and or small inferior osteophyte present.  No erosions abnormal calcifications or visible effusions. Impression Normal x-ray  XR KNEE 3 VIEW RIGHT  Result Date: 06/03/2023 X-ray right knee 3 views Some mild appearing osteoarthritis with some narrowing at  the medial edge of joint and osteophyte or enthesophyte process at the tibiofibular joint.  Small inferior patellar osteophyte.  May be small joint effusion or other soft tissue swelling noted at medial edge of joint.  No erosions or abnormal calcifications seen. Impression Mild appearing multicompartmental osteoarthritis possible small effusion no acute appearing abnormality   Recent Results (from the past 2160 hour(s))  POC COVID-19 BinaxNow     Status: None   Collection Time: 05/16/23 11:11 AM  Result Value Ref Range   SARS Coronavirus 2 Ag Negative Negative  POCT Influenza A/B     Status: None   Collection Time: 05/16/23 11:12 AM  Result Value Ref Range   Influenza A, POC Negative Negative   Influenza B, POC Negative Negative  Rheumatoid factor     Status: Abnormal   Collection Time: 05/31/23  9:08 AM  Result Value Ref Range   Rheumatoid fact SerPl-aCnc 39 (H) <14 IU/mL  Sedimentation rate     Status: None   Collection Time: 05/31/23  9:08 AM  Result Value Ref Range   Sed Rate 6 0 - 20 mm/h  C-reactive protein     Status: None   Collection Time: 05/31/23  9:08 AM  Result Value Ref Range   CRP 4.0 <8.0 mg/L  Mutated Citrullinated Vimentin (MCV) Antibody     Status: None   Collection Time: 05/31/23  9:08 AM  Result Value Ref Range   MUTATED CITRULLINATED VIMENTIN (MCV) AB <20 <20 U/mL    Comment: . Anti-mutated citrullinated vimentin antibody may be used as a second-line marker of rheumatoid arthritis, in addition to rheumatoid factor and anti-cyclic citrullinated peptide (CCP). .   Basic Metabolic Panel (BMET)     Status: Abnormal   Collection Time: 06/13/23 10:55 AM  Result Value Ref Range   Sodium 139 135 - 145 mEq/L   Potassium 4.1 3.5 - 5.1 mEq/L   Chloride 108 96 - 112 mEq/L   CO2 23 19 - 32 mEq/L   Glucose, Bld 100 (H) 70 - 99 mg/dL   BUN 13 6 - 23 mg/dL   Creatinine, Ser 4.09 0.40 - 1.20 mg/dL   GFR 811.91 >47.82 mL/min    Comment: Calculated using the CKD-EPI  Creatinine Equation (2021)   Calcium 8.9 8.4 - 10.5 mg/dL  POC NFAOZ-30 BinaxNow     Status: None   Collection Time: 08/07/23  4:43 PM  Result Value Ref Range   SARS Coronavirus 2 Ag Negative Negative  POCT Influenza A/B     Status: None   Collection Time: 08/07/23  4:43 PM  Result Value Ref Range   Influenza A, POC Negative Negative   Influenza B, POC Negative Negative        Garner Nash, MD, MS

## 2023-08-07 NOTE — Assessment & Plan Note (Signed)
Chronic. With blurred vision. Referral to ophthalmology

## 2023-08-07 NOTE — Patient Instructions (Addendum)
Use your albuterol inhaler as needed to manage your symptoms. Take the prescribed prednisone 40mg  for 5 days if your symptoms worsen. Use the promethazine with dextromethorphan syrup and Tessalon Perles to help manage your cough. Schedule an appointment with an ophthalmologist for an eye examination. Follow up with the sleep study : contact the Dr. Frances Furbish with [facility]  at [phone] .

## 2023-08-07 NOTE — Assessment & Plan Note (Addendum)
Chronic Condition Status: Worsening. Likely viral induced Plan: Promethazine with Dextromethorphan syrup Benzonatate (Tessalon Perles) for additional cough relief. Prednisone 40 mg for 5 days  Continue using Albuterol inhaler as needed. Encouraged to avoid smoking to improve respiratory health. Torodol 60 mg IM x1 for headache Follow up as needed

## 2023-08-08 ENCOUNTER — Encounter (HOSPITAL_COMMUNITY): Payer: Self-pay | Admitting: Psychiatry

## 2023-08-08 ENCOUNTER — Encounter: Payer: Self-pay | Admitting: Neurology

## 2023-08-08 ENCOUNTER — Telehealth (HOSPITAL_BASED_OUTPATIENT_CLINIC_OR_DEPARTMENT_OTHER): Payer: 59 | Admitting: Psychiatry

## 2023-08-08 VITALS — Wt 202.0 lb

## 2023-08-08 DIAGNOSIS — F331 Major depressive disorder, recurrent, moderate: Secondary | ICD-10-CM | POA: Diagnosis not present

## 2023-08-08 DIAGNOSIS — F1210 Cannabis abuse, uncomplicated: Secondary | ICD-10-CM | POA: Diagnosis not present

## 2023-08-08 DIAGNOSIS — F4312 Post-traumatic stress disorder, chronic: Secondary | ICD-10-CM | POA: Diagnosis not present

## 2023-08-08 DIAGNOSIS — F1010 Alcohol abuse, uncomplicated: Secondary | ICD-10-CM | POA: Diagnosis not present

## 2023-08-08 MED ORDER — DULOXETINE HCL 30 MG PO CPEP: 30.0000 mg | ORAL_CAPSULE | Freq: Every day | ORAL | 2 refills | Status: DC

## 2023-08-08 MED ORDER — QUETIAPINE FUMARATE 50 MG PO TABS: 50.0000 mg | ORAL_TABLET | Freq: Every day | ORAL | 2 refills | Status: DC
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
## 2023-08-11 ENCOUNTER — Ambulatory Visit (INDEPENDENT_AMBULATORY_CARE_PROVIDER_SITE_OTHER): Payer: 59 | Admitting: Family

## 2023-08-11 ENCOUNTER — Telehealth: Payer: Self-pay | Admitting: Family Medicine

## 2023-08-11 ENCOUNTER — Ambulatory Visit: Payer: 59

## 2023-08-11 ENCOUNTER — Ambulatory Visit (HOSPITAL_BASED_OUTPATIENT_CLINIC_OR_DEPARTMENT_OTHER)
Admission: RE | Admit: 2023-08-11 | Discharge: 2023-08-11 | Disposition: A | Discharge status: Home / Self Care | Payer: 59 | Source: Ambulatory Visit | Attending: Family | Admitting: Family

## 2023-08-11 VITALS — BP 146/84 | HR 85 | Ht 66.0 in | Wt 207.0 lb

## 2023-08-11 DIAGNOSIS — J4551 Severe persistent asthma with (acute) exacerbation: Secondary | ICD-10-CM

## 2023-08-11 DIAGNOSIS — R042 Hemoptysis: Secondary | ICD-10-CM | POA: Diagnosis not present

## 2023-08-11 DIAGNOSIS — R059 Cough, unspecified: Secondary | ICD-10-CM | POA: Diagnosis not present

## 2023-08-11 MED ORDER — AMOXICILLIN-POT CLAVULANATE 875-125 MG PO TABS: 1.0000 | ORAL_TABLET | Freq: Two times a day (BID) | ORAL | 0 refills | Status: AC

## 2023-08-11 MED ORDER — PREDNISONE 10 MG PO TABS: ORAL_TABLET | ORAL | 0 refills | Status: DC

## 2023-08-11 NOTE — Telephone Encounter (Signed)
Pt was seen on Monday 08/07/23 by Dr Renee Cross for coughing, wheezing and chills. She called in stating she had been coughing up blood several times this morning 08/11/23. She sounded very lethargic and she was coughing. I transferred her over to nurse triage.

## 2023-08-11 NOTE — Progress Notes (Signed)
Renee Cross is a 47 y.o. female with the following history as recorded in Epic[employer]:  Patient Active Problem List   Diagnosis Date Noted   Osteoarthritis of right knee 07/02/2023   Bilateral knee pain 06/28/2023   Non-recurrent acute suppurative otitis media of left ear without spontaneous rupture of tympanic membrane 05/16/2023   Tobacco use disorder 05/16/2023   Alcohol use disorder 01/14/2023   Financial difficulty 01/14/2023   Episode of recurrent major depressive disorder (HCC) 01/14/2023   B12 deficiency 01/14/2023   History of suicide attempt 01/14/2023   Amblyopia of left eye 12/22/2022   Anemia 12/22/2022   Rheumatoid arthritis (HCC) 12/22/2022   Abnormal uterine bleeding (AUB) 12/22/2022   Severe persistent asthma with exacerbation 12/22/2022   Hip arthritis 12/22/2022   Gastroesophageal reflux disease 12/22/2022   Perennial allergic rhinitis with seasonal variation 12/22/2022   Preoperative evaluation to rule out surgical contraindication 12/22/2022   Overuse of medication 12/22/2022   Primary hypertension 12/22/2022    Current Outpatient Medications  Medication Sig Dispense Refill   albuterol (PROVENTIL) (2.5 MG/3ML) 0.083% nebulizer solution Take 3 mLs (2.5 mg total) by nebulization every 4 (four) hours as needed for wheezing or shortness of breath. 75 mL 1   albuterol (VENTOLIN HFA) 108 (90 Base) MCG/ACT inhaler 2 puffs every 4-6 hours as needed for cough or wheeze. 18 g 1   amoxicillin-clavulanate (AUGMENTIN) 875-125 MG tablet Take 1 tablet by mouth 2 (two) times daily for 10 days. 20 tablet 0   benzonatate (TESSALON) 200 MG capsule Take 1 capsule (200 mg total) by mouth 2 (two) times daily as needed for cough. 20 capsule 0   cetirizine (ZYRTEC) 10 MG tablet Take 1 tablet (10 mg total) by mouth daily. 1 po q day prn allergies (Patient taking differently: Take 10 mg by mouth at bedtime.) 30 tablet 0   cyanocobalamin (VITAMIN B12) 1000 MCG tablet Take 1 tablet (1,000  mcg total) by mouth daily. 90 tablet 3   DULoxetine (CYMBALTA) 30 MG capsule Take 1 capsule (30 mg total) by mouth daily. 30 capsule 2   esomeprazole (NEXIUM) 40 MG capsule Take 1 capsule (40 mg total) by mouth daily. 30 capsule 3   ferrous sulfate 325 (65 FE) MG EC tablet Take 325 mg by mouth daily.     fluticasone (FLONASE) 50 MCG/ACT nasal spray Place 2 sprays into both nostrils every morning. 2 sprays per nostril every day for stuffy nose or drainage. 48 g 1   Fluticasone-Umeclidin-Vilant (TRELEGY ELLIPTA) 200-62.5-25 MCG/ACT AEPB Inhale 1 puff into the lungs in the morning. 180 each 1   ipratropium-albuterol (DUONEB) 0.5-2.5 (3) MG/3ML SOLN TAKE 3 MLS BY NEBULIZATION EVERY 4 (FOUR) HOURS AS NEEDED. 360 mL 0   IRON, FERROUS GLUCONATE, PO Take by mouth.     loratadine (CLARITIN) 10 MG tablet Take 1 tablet (10 mg total) by mouth 2 (two) times daily. 180 tablet 1   medroxyPROGESTERone (PROVERA) 10 MG tablet Take 1 tablet by mouth daily.     meloxicam (MOBIC) 15 MG tablet Take 1 tablet (15 mg total) by mouth daily. For 2 weeks for pain and inflammation. Then take as needed 30 tablet 0   methocarbamol (ROBAXIN) 500 MG tablet Take 1 tablet (500 mg total) by mouth every 8 (eight) hours as needed for muscle spasms. 30 tablet 0   mometasone (ELOCON) 0.1 % ointment Apply topically daily. Apply once daily to red itchy areas or until resolved 135 g 1   montelukast (SINGULAIR) 10 MG  tablet Take 1 tablet (10 mg total) by mouth at bedtime. 30 tablet 3   Multiple Vitamin (MULTIVITAMIN WITH MINERALS) TABS tablet Take 1 tablet by mouth daily.     Olmesartan-amLODIPine-HCTZ 40-10-25 MG TABS Take 1 tablet by mouth daily. 90 tablet 3   Olopatadine HCl (PATADAY) 0.2 % SOLN Place 1 drop into both eyes daily as needed (allergies). 2.5 mL 3   omeprazole (PRILOSEC) 40 MG capsule Take 1 capsule (40 mg total) by mouth every morning. 90 capsule 1   predniSONE (DELTASONE) 10 MG tablet Take 5 tablets x 1 day, then 4 tablets  x 1 day, then 3 tablets x 1 day, then 2 tablets x 1 day 14 tablet 0   predniSONE (DELTASONE) 20 MG tablet Take 2 tablets (40 mg total) by mouth daily with breakfast for 5 days. 10 tablet 0   promethazine-dextromethorphan (PROMETHAZINE-DM) 6.25-15 MG/5ML syrup Take 5 mLs by mouth 4 (four) times daily as needed. 118 mL 0   QUEtiapine (SEROQUEL) 50 MG tablet Take 1 tablet (50 mg total) by mouth at bedtime. 30 tablet 2   sennosides-docusate sodium (SENOKOT-S) 8.6-50 MG tablet Take 2 tablets by mouth daily. To help prevent constipation while taking pain medication 30 tablet 0   Current Facility-Administered Medications  Medication Dose Route Frequency Provider Last Rate Last Admin   mepolizumab (NUCALA) SOSY 100 mg  100 mg Subcutaneous Q28 days [name], MD   100 mg at 08/04/23 1507    Allergies: Patient has no known allergies.  Past Medical History:  Diagnosis Date   Anemia    Arthritis    Asthma    Depression    GERD (gastroesophageal reflux disease)    Hiatal hernia with GERD    Hypertension    Pneumonia    PTSD (post-traumatic stress disorder)    Sickle cell trait (HCC)     Past Surgical History:  Procedure Laterality Date   HERNIA REPAIR     SINUS EXPLORATION     STOMACH SURGERY  10/03/2001   Possible fundiplication?   TONSILLECTOMY     TOTAL HIP ARTHROPLASTY Right 02/14/2023   Procedure: TOTAL HIP ARTHROPLASTY ANTERIOR APPROACH;  Surgeon: Sheral Apley, MD;  Location: [facility];  Service: Orthopedics;  Laterality: Right;   TUBAL LIGATION      No family history on file.  Social History   Tobacco Use   Smoking status: Former    Types: Cigars    Passive exposure: Never   Smokeless tobacco: Never   Tobacco comments:    Smokes black and mild occassionally  Substance Use Topics   Alcohol use: Yes    Alcohol/week: 2.0 standard drinks of alcohol    Types: 2 Cans of beer per week    Subjective:   Presents with concerns for persisting asthma exacerbation; saw her PCP  earlier this week with similar symptoms- was given Rx for prednisone 40 mg every day x 5 days; notes that she had blood tinged sputum after doing breathing treatment today; "can feel the cold in my chest."  Unfortunately not able to take Trelegy as prescribed- per patient, "she feels like it is too strong." She did receive Nucala injection on November 1 with her asthma specialist;   Objective:  Vitals:   08/11/23 1335  BP: (!) 146/84  Pulse: 85  SpO2: 97%  Weight: 207 lb (93.9 kg)  Height: 5\' 6"  (1.676 m)    General: Well developed, well nourished, in no acute distress  Skin : Warm  and dry.  Head: Normocephalic and atraumatic  Eyes: Sclera and conjunctiva clear; pupils round and reactive to light; extraocular movements intact  Ears: External normal; canals clear; tympanic membranes normal  Oropharynx: Pink, supple. No suspicious lesions  Neck: Supple without thyromegaly, adenopathy  Lungs: Respirations unlabored; coarse breath sounds in all 4 lobes; CVS exam: normal rate and regular rhythm.  Neurologic: Alert and oriented; speech intact; face symmetrical; moves all extremities well; CNII-XII intact without focal deficit   Assessment:  1. Cough, unspecified type   2. Severe persistent asthma with exacerbation     Plan:  Update CXR today; Rx for Augmentin 875 mg bid x 10 days; will extend steroid taper- 60 mg today, 50 mg tomorrow, 40 mg, then 30 mg, then 20 mg as directed; continue rescue breathing treatments as directed; Strict ER precautions discussed for upcoming weekend; patient is encouraged to schedule follow up with her PCP and asthma specialist as well- she needs to let provider know she cannot tolerate the Trelegy.   No follow-ups on file.  Orders Placed This Encounter  Procedures   DG Chest 2 View    Standing Status:   Future    Number of Occurrences:   1    Standing Expiration Date:   08/10/2024    Order Specific Question:   Reason for Exam (SYMPTOM  OR DIAGNOSIS  REQUIRED)    Answer:   cough/ hemoptysis    Order Specific Question:   Is patient pregnant?    Answer:   No    Order Specific Question:   Preferred imaging location?    Answer:   [facility]    Requested Prescriptions   Signed Prescriptions Disp Refills   amoxicillin-clavulanate (AUGMENTIN) 875-125 MG tablet 20 tablet 0    Sig: Take 1 tablet by mouth 2 (two) times daily for 10 days.   predniSONE (DELTASONE) 10 MG tablet 14 tablet 0    Sig: Take 5 tablets x 1 day, then 4 tablets x 1 day, then 3 tablets x 1 day, then 2 tablets x 1 day

## 2023-08-11 NOTE — Patient Instructions (Signed)
Please take 2 more prednisone tablets today for a total of 60 mg tomorrow; start the taper tomorrow as discussed.   Please follow up with your PCP next week for re-check.  Please go to the ER this weekend if you feel like your symptoms are worsening.

## 2023-08-15 ENCOUNTER — Ambulatory Visit: Payer: 59 | Admitting: Internal Medicine

## 2023-08-15 ENCOUNTER — Encounter: Payer: Self-pay | Admitting: Internal Medicine

## 2023-08-18 ENCOUNTER — Ambulatory Visit
Admission: RE | Admit: 2023-08-18 | Discharge: 2023-08-18 | Disposition: A | Discharge status: Home / Self Care | Payer: Medicaid Other | Source: Ambulatory Visit | Attending: Internal Medicine | Admitting: Internal Medicine

## 2023-08-18 DIAGNOSIS — M1711 Unilateral primary osteoarthritis, right knee: Secondary | ICD-10-CM

## 2023-08-18 DIAGNOSIS — G8929 Other chronic pain: Secondary | ICD-10-CM | POA: Diagnosis not present

## 2023-08-18 DIAGNOSIS — M25561 Pain in right knee: Secondary | ICD-10-CM | POA: Diagnosis not present

## 2023-08-18 DIAGNOSIS — M23331 Other meniscus derangements, other medial meniscus, right knee: Secondary | ICD-10-CM | POA: Diagnosis not present

## 2023-08-23 ENCOUNTER — Encounter (HOSPITAL_COMMUNITY): Payer: Self-pay

## 2023-08-23 ENCOUNTER — Ambulatory Visit (INDEPENDENT_AMBULATORY_CARE_PROVIDER_SITE_OTHER): Payer: 59 | Admitting: Licensed Clinical Social Worker

## 2023-08-23 DIAGNOSIS — F331 Major depressive disorder, recurrent, moderate: Secondary | ICD-10-CM

## 2023-08-23 HISTORY — DX: Major depressive disorder, recurrent, moderate: F33.1
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
## 2023-08-25 ENCOUNTER — Ambulatory Visit (INDEPENDENT_AMBULATORY_CARE_PROVIDER_SITE_OTHER): Payer: 59 | Admitting: Family Medicine

## 2023-08-25 ENCOUNTER — Telehealth: Payer: Self-pay

## 2023-08-25 ENCOUNTER — Encounter: Payer: Self-pay | Admitting: Family Medicine

## 2023-08-25 VITALS — BP 128/84 | HR 85 | Temp 97.6°F | Wt 207.2 lb

## 2023-08-25 DIAGNOSIS — R0602 Shortness of breath: Secondary | ICD-10-CM | POA: Diagnosis not present

## 2023-08-25 DIAGNOSIS — E66811 Obesity, class 1: Secondary | ICD-10-CM | POA: Diagnosis not present

## 2023-08-25 DIAGNOSIS — R042 Hemoptysis: Secondary | ICD-10-CM | POA: Diagnosis not present

## 2023-08-25 DIAGNOSIS — R079 Chest pain, unspecified: Secondary | ICD-10-CM

## 2023-08-25 DIAGNOSIS — E661 Drug-induced obesity: Secondary | ICD-10-CM | POA: Diagnosis not present

## 2023-08-25 DIAGNOSIS — M0579 Rheumatoid arthritis with rheumatoid factor of multiple sites without organ or systems involvement: Secondary | ICD-10-CM | POA: Diagnosis not present

## 2023-08-25 DIAGNOSIS — Z0271 Encounter for disability determination: Secondary | ICD-10-CM | POA: Diagnosis not present

## 2023-08-25 DIAGNOSIS — Z7952 Long term (current) use of systemic steroids: Secondary | ICD-10-CM | POA: Diagnosis not present

## 2023-08-25 DIAGNOSIS — J4551 Severe persistent asthma with (acute) exacerbation: Secondary | ICD-10-CM

## 2023-08-25 DIAGNOSIS — D509 Iron deficiency anemia, unspecified: Secondary | ICD-10-CM

## 2023-08-25 DIAGNOSIS — Z6833 Body mass index (BMI) 33.0-33.9, adult: Secondary | ICD-10-CM

## 2023-08-25 DIAGNOSIS — Z1211 Encounter for screening for malignant neoplasm of colon: Secondary | ICD-10-CM

## 2023-08-25 DIAGNOSIS — K219 Gastro-esophageal reflux disease without esophagitis: Secondary | ICD-10-CM

## 2023-08-25 LAB — COMPREHENSIVE METABOLIC PANEL
ALT: 14 U/L (ref 0–35)
AST: 16 U/L (ref 0–37)
Albumin: 4.2 g/dL (ref 3.5–5.2)
Alkaline Phosphatase: 68 U/L (ref 39–117)
BUN: 9 mg/dL (ref 6–23)
CO2: 25 meq/L (ref 19–32)
Calcium: 8.8 mg/dL (ref 8.4–10.5)
Chloride: 108 meq/L (ref 96–112)
Creatinine, Ser: 0.62 mg/dL (ref 0.40–1.20)
GFR: 106.12 mL/min (ref >60.00)
Glucose, Bld: 93 mg/dL (ref 70–99)
Potassium: 3.9 meq/L (ref 3.5–5.1)
Sodium: 138 meq/L (ref 135–145)
Total Bilirubin: 0.4 mg/dL (ref 0.2–1.2)
Total Protein: 6.8 g/dL (ref 6.0–8.3)

## 2023-08-25 LAB — CBC WITH DIFFERENTIAL/PLATELET
Basophils Absolute: 0.1 10*3/uL (ref 0.0–0.1)
Basophils Relative: 1.7 % (ref 0.0–3.0)
Eosinophils Absolute: 0 10*3/uL (ref 0.0–0.7)
Eosinophils Relative: 0.6 % (ref 0.0–5.0)
HCT: 40.9 % (ref 36.0–46.0)
Hemoglobin: 13.3 g/dL (ref 12.0–15.0)
Lymphocytes Relative: 20.6 % (ref 12.0–46.0)
Lymphs Abs: 1.6 10*3/uL (ref 0.7–4.0)
MCHC: 32.5 g/dL (ref 30.0–36.0)
MCV: 87.9 fL (ref 78.0–100.0)
Monocytes Absolute: 0.5 10*3/uL (ref 0.1–1.0)
Monocytes Relative: 6.5 % (ref 3.0–12.0)
Neutro Abs: 5.5 10*3/uL (ref 1.4–7.7)
Neutrophils Relative %: 70.6 % (ref 43.0–77.0)
Platelets: 205 10*3/uL (ref 150.0–400.0)
RBC: 4.66 Mil/uL (ref 3.87–5.11)
RDW: 16 % — ABNORMAL HIGH (ref 11.5–15.5)
WBC: 7.7 10*3/uL (ref 4.0–10.5)

## 2023-08-25 MED ORDER — PROMETHAZINE-DM 6.25-15 MG/5ML PO SYRP: 5.0000 mL | ORAL_SOLUTION | Freq: Four times a day (QID) | ORAL | 0 refills | Status: DC | PRN

## 2023-08-25 MED ORDER — METHOCARBAMOL 500 MG PO TABS: 500.0000 mg | ORAL_TABLET | Freq: Three times a day (TID) | ORAL | 0 refills | Status: DC | PRN

## 2023-08-25 MED ORDER — FAMOTIDINE 40 MG PO TABS: 40.0000 mg | ORAL_TABLET | Freq: Every day | ORAL | 0 refills | Status: DC

## 2023-08-25 NOTE — Telephone Encounter (Signed)
Can you change the location to [facility] for CT Angio Chest Pulmonary Embolism (PE) W or WO Contrast Per Leona Carry.

## 2023-08-25 NOTE — Patient Instructions (Signed)
Asthma Management:  Use your nebulizer treatments more frequently as needed for symptom relief. Take your rescue inhaler (albuterol) as prescribed. Refill and take promethazine with dextromethorphan syrup as directed for cough.  Medication Guidance:  Start taking famotidine as prescribed to help with stomach discomfort. Take esomeprazole (Nexium) once daily, at least 1 hour before a meal, preferably in the morning on an empty stomach. Refill and take methocarbamol as directed to help with muscle pain. Continue taking meloxicam as prescribed for inflammation and pain.  Monitor Symptoms:  Watch for any increase in shortness of breath, difficulty breathing, wheezing, chest pain, coughing up blood, or leg swelling. If you experience worsening symptoms or new symptoms, go to the emergency room or call 911.  Diagnostic Tests:  We are obtaining baseline lab workup to assess metabolic and blood counts. Schedule and complete the CT scan of your chest as soon as possible to check for pulmonary embolism (a blood clot to the lungs).  Referrals and Appointments:  Expect a call from the pain management clinic to schedule an appointment. Schedule your colonoscopy when contacted to evaluate your gastrointestinal symptoms.  Handicap Placard:  Completed handicap placard renewal form to the DMV.

## 2023-08-25 NOTE — Progress Notes (Unsigned)
Assessment/Plan:   Problem List Items Addressed This Visit   None   There are no discontinued medications.  No follow-ups on file.    Subjective:   Encounter date: 08/25/2023  Renee Cross is a 47 y.o. female who has Amblyopia of left eye; Anemia; Rheumatoid arthritis (HCC); Abnormal uterine bleeding (AUB); Severe persistent asthma with exacerbation; Hip arthritis; Gastroesophageal reflux disease; Perennial allergic rhinitis with seasonal variation; Preoperative evaluation to rule out surgical contraindication; Overuse of medication; Primary hypertension; Alcohol use disorder; Financial difficulty; Episode of recurrent major depressive disorder (HCC); B12 deficiency; History of suicide attempt; Non-recurrent acute suppurative otitis media of left ear without spontaneous rupture of tympanic membrane; Tobacco use disorder; Bilateral knee pain; Osteoarthritis of right knee; and MDD (major depressive disorder), recurrent episode, moderate (HCC) on their problem list..   She  has a past medical history of Anemia, Arthritis, Asthma, Depression, GERD (gastroesophageal reflux disease), Hiatal hernia with GERD, Hypertension, Pneumonia, PTSD (post-traumatic stress disorder), and Sickle cell trait (HCC)..   She presents with chief complaint of Medical Management of Chronic Issues (Cough is still ongoing at night.  Demetrius Charity management. Discuss handicap placcard renewal. Pain management for legs) .   HPI:   ROS  Past Surgical History:  Procedure Laterality Date   HERNIA REPAIR     SINUS EXPLORATION     STOMACH SURGERY  10/03/2001   Possible fundiplication?   TONSILLECTOMY     TOTAL HIP ARTHROPLASTY Right 02/14/2023   Procedure: TOTAL HIP ARTHROPLASTY ANTERIOR APPROACH;  Surgeon: Sheral Apley, MD;  Location: [facility];  Service: Orthopedics;  Laterality: Right;   TUBAL LIGATION      Outpatient Medications Prior to Visit  Medication Sig Dispense Refill   albuterol (PROVENTIL) (2.5  MG/3ML) 0.083% nebulizer solution Take 3 mLs (2.5 mg total) by nebulization every 4 (four) hours as needed for wheezing or shortness of breath. 75 mL 1   albuterol (VENTOLIN HFA) 108 (90 Base) MCG/ACT inhaler 2 puffs every 4-6 hours as needed for cough or wheeze. 18 g 1   benzonatate (TESSALON) 200 MG capsule Take 1 capsule (200 mg total) by mouth 2 (two) times daily as needed for cough. 20 capsule 0   cyanocobalamin (VITAMIN B12) 1000 MCG tablet Take 1 tablet (1,000 mcg total) by mouth daily. 90 tablet 3   DULoxetine (CYMBALTA) 30 MG capsule Take 1 capsule (30 mg total) by mouth daily. 30 capsule 2   esomeprazole (NEXIUM) 40 MG capsule Take 1 capsule (40 mg total) by mouth daily. 30 capsule 3   ferrous sulfate 325 (65 FE) MG EC tablet Take 325 mg by mouth daily.     fluticasone (FLONASE) 50 MCG/ACT nasal spray Place 2 sprays into both nostrils every morning. 2 sprays per nostril every day for stuffy nose or drainage. 48 g 1   Fluticasone-Umeclidin-Vilant (TRELEGY ELLIPTA) 200-62.5-25 MCG/ACT AEPB Inhale 1 puff into the lungs in the morning. 180 each 1   ipratropium-albuterol (DUONEB) 0.5-2.5 (3) MG/3ML SOLN TAKE 3 MLS BY NEBULIZATION EVERY 4 (FOUR) HOURS AS NEEDED. 360 mL 0   IRON, FERROUS GLUCONATE, PO Take by mouth.     loratadine (CLARITIN) 10 MG tablet Take 1 tablet (10 mg total) by mouth 2 (two) times daily. 180 tablet 1   medroxyPROGESTERone (PROVERA) 10 MG tablet Take 1 tablet by mouth daily.     meloxicam (MOBIC) 15 MG tablet Take 1 tablet (15 mg total) by mouth daily. For 2 weeks for pain and inflammation. Then take as needed  30 tablet 0   methocarbamol (ROBAXIN) 500 MG tablet Take 1 tablet (500 mg total) by mouth every 8 (eight) hours as needed for muscle spasms. 30 tablet 0   mometasone (ELOCON) 0.1 % ointment Apply topically daily. Apply once daily to red itchy areas or until resolved 135 g 1   montelukast (SINGULAIR) 10 MG tablet Take 1 tablet (10 mg total) by mouth at bedtime. 30  tablet 3   Multiple Vitamin (MULTIVITAMIN WITH MINERALS) TABS tablet Take 1 tablet by mouth daily.     Olmesartan-amLODIPine-HCTZ 40-10-25 MG TABS Take 1 tablet by mouth daily. 90 tablet 3   Olopatadine HCl (PATADAY) 0.2 % SOLN Place 1 drop into both eyes daily as needed (allergies). 2.5 mL 3   omeprazole (PRILOSEC) 40 MG capsule Take 1 capsule (40 mg total) by mouth every morning. 90 capsule 1   predniSONE (DELTASONE) 10 MG tablet Take 5 tablets x 1 day, then 4 tablets x 1 day, then 3 tablets x 1 day, then 2 tablets x 1 day 14 tablet 0   promethazine-dextromethorphan (PROMETHAZINE-DM) 6.25-15 MG/5ML syrup Take 5 mLs by mouth 4 (four) times daily as needed. 118 mL 0   QUEtiapine (SEROQUEL) 50 MG tablet Take 1 tablet (50 mg total) by mouth at bedtime. 30 tablet 2   sennosides-docusate sodium (SENOKOT-S) 8.6-50 MG tablet Take 2 tablets by mouth daily. To help prevent constipation while taking pain medication 30 tablet 0   cetirizine (ZYRTEC) 10 MG tablet Take 1 tablet (10 mg total) by mouth daily. 1 po q day prn allergies (Patient not taking: Reported on 08/25/2023) 30 tablet 0   Facility-Administered Medications Prior to Visit  Medication Dose Route Frequency Provider Last Rate Last Admin   mepolizumab (NUCALA) SOSY 100 mg  100 mg Subcutaneous Q28 days [name], MD   100 mg at 08/04/23 1507    No family history on file.  Social History   Socioeconomic History   Marital status: Single    Spouse name: Not on file   Number of children: Not on file   Years of education: Not on file   Highest education level: Not on file  Occupational History   Not on file  Tobacco Use   Smoking status: Former    Types: Cigars    Passive exposure: Never   Smokeless tobacco: Never   Tobacco comments:    Smokes black and mild occassionally  Vaping Use   Vaping status: Never Used  Substance and Sexual Activity   Alcohol use: Yes    Alcohol/week: 2.0 standard drinks of alcohol    Types: 2 Cans of  beer per week   Drug use: Yes    Frequency: 4.0 times per week    Types: Marijuana    Comment: edibles   Sexual activity: Yes    Birth control/protection: Surgical  Other Topics Concern   Not on file  Social History Narrative   Caffiene 2 days / week 2 cans soda   Works not working.    Social Determinants of Health   Financial Resource Strain: Not on file  Food Insecurity: Not on file  Transportation Needs: Not on file  Physical Activity: Not on file  Stress: Not on file  Social Connections: Not on file  Intimate Partner Violence: Not on file  Objective:  Physical Exam: BP 128/84 (BP Location: Left Arm, Patient Position: Sitting, Cuff Size: Large)   Pulse 85   Temp 97.6 F (36.4 C) (Temporal)   Wt 207 lb 3.2 oz (94 kg)   LMP 08/03/2023   SpO2 99%   BMI 33.44 kg/m   Wt Readings from Last 3 Encounters:  08/25/23 207 lb 3.2 oz (94 kg)  08/11/23 207 lb (93.9 kg)  08/07/23 202 lb (91.6 kg)     Physical Exam  DG Chest 2 View  Result Date: 08/11/2023 CLINICAL DATA:  Cough and hemoptysis EXAM: CHEST - 2 VIEW COMPARISON:  X-ray 07/31/2018. FINDINGS: The heart size and mediastinal contours are within normal limits. Both lungs are clear. Hyperinflation. No consolidation, pneumothorax, effusion or edema. The visualized skeletal structures are unremarkable. IMPRESSION: No acute cardiopulmonary disease. Electronically Signed   By: Karen Kays M.D.   On: 08/11/2023 16:56   XR KNEE 3 VIEW LEFT  Result Date: 06/03/2023 X-ray left knee 3 views Medial lateral compartment joint space appears normal.  Patellofemoral joint appears normal.  Patellar structure ring and or small inferior osteophyte present.  No erosions abnormal calcifications or visible effusions. Impression Normal x-ray  XR KNEE 3 VIEW RIGHT  Result Date: 06/03/2023 X-ray right knee 3 views Some mild appearing  osteoarthritis with some narrowing at the medial edge of joint and osteophyte or enthesophyte process at the tibiofibular joint.  Small inferior patellar osteophyte.  May be small joint effusion or other soft tissue swelling noted at medial edge of joint.  No erosions or abnormal calcifications seen. Impression Mild appearing multicompartmental osteoarthritis possible small effusion no acute appearing abnormality   Recent Results (from the past 2160 hour(s))  Rheumatoid factor     Status: Abnormal   Collection Time: 05/31/23  9:08 AM  Result Value Ref Range   Rheumatoid fact SerPl-aCnc 39 (H) <14 IU/mL  Sedimentation rate     Status: None   Collection Time: 05/31/23  9:08 AM  Result Value Ref Range   Sed Rate 6 0 - 20 mm/h  C-reactive protein     Status: None   Collection Time: 05/31/23  9:08 AM  Result Value Ref Range   CRP 4.0 <8.0 mg/L  Mutated Citrullinated Vimentin (MCV) Antibody     Status: None   Collection Time: 05/31/23  9:08 AM  Result Value Ref Range   MUTATED CITRULLINATED VIMENTIN (MCV) AB <20 <20 U/mL    Comment: . Anti-mutated citrullinated vimentin antibody may be used as a second-line marker of rheumatoid arthritis, in addition to rheumatoid factor and anti-cyclic citrullinated peptide (CCP). .   Basic Metabolic Panel (BMET)     Status: Abnormal   Collection Time: 06/13/23 10:55 AM  Result Value Ref Range   Sodium 139 135 - 145 mEq/L   Potassium 4.1 3.5 - 5.1 mEq/L   Chloride 108 96 - 112 mEq/L   CO2 23 19 - 32 mEq/L   Glucose, Bld 100 (H) 70 - 99 mg/dL   BUN 13 6 - 23 mg/dL   Creatinine, Ser 1.61 0.40 - 1.20 mg/dL   GFR 096.04 >54.09 mL/min    Comment: Calculated using the CKD-EPI Creatinine Equation (2021)   Calcium 8.9 8.4 - 10.5 mg/dL  POC [id number] BinaxNow     Status: None   Collection Time: 08/07/23  4:43 PM  Result Value Ref Range   SARS Coronavirus 2 Ag Negative Negative  POCT Influenza A/B     Status: None   Collection Time:  08/07/23  4:43 PM   Result Value Ref Range   Influenza A, POC Negative Negative   Influenza B, POC Negative Negative        Garner Nash, MD, MS

## 2023-08-26 DIAGNOSIS — Z1211 Encounter for screening for malignant neoplasm of colon: Secondary | ICD-10-CM

## 2023-08-26 DIAGNOSIS — E66811 Obesity, class 1: Secondary | ICD-10-CM

## 2023-08-26 DIAGNOSIS — E661 Drug-induced obesity: Secondary | ICD-10-CM | POA: Insufficient documentation

## 2023-08-26 HISTORY — DX: Encounter for screening for malignant neoplasm of colon: Z12.11

## 2023-08-26 HISTORY — DX: Obesity, class 1: E66.811

## 2023-08-26 LAB — PRO B NATRIURETIC PEPTIDE: NT-Pro BNP: 69 pg/mL (ref 0–249)

## 2023-08-26 NOTE — Assessment & Plan Note (Signed)
Needs to reschedule colonoscopy; Plan: Refer to gastroenterology for colonoscopy scheduling.

## 2023-08-26 NOTE — Assessment & Plan Note (Signed)
Provided completed DMV handicap placard renewal form.

## 2023-08-26 NOTE — Assessment & Plan Note (Signed)
Burning stomach and vomiting related to acid reflux, likely from steroid use and inconsistent Esomeprazole dosing. Plan: Reinforce proper Esomeprazole administration: take once daily, one hour before meals. Prescribe Famotidine for additional acid suppression. Monitor for GI bleeding. Follow-Up: Reassess GI symptoms at next visit.

## 2023-08-26 NOTE — Assessment & Plan Note (Signed)
Worsening leg pain and edema from rheumatoid arthritis; awaiting surgery. Plan: Refer to pain management. Refill Methocarbamol. Continue Meloxicam.

## 2023-08-26 NOTE — Assessment & Plan Note (Signed)
Weight gain attributed to steroid use; patient is concerned about health impact. Plan: Minimize steroid use when possible. Discuss weight management strategies in future visits. Follow-Up: Reassess weight at next visit.

## 2023-08-26 NOTE — Assessment & Plan Note (Signed)
Persistent asthma exacerbation despite corticosteroids, with cough, chest pain, increased albuterol use, and hemoptysis. Pulmonary embolism is a concern.  Plan: Order stat chest CT angiogram to rule out pulmonary embolism. EKG performed: normal sinus rhythm, no ST changes. Labs ordered: CBC, BMP, BNP, LFTs. Increase nebulizer treatments as needed. Refill Promethazine with Dextromethorphan syrup and Benzonatate for cough relief. Instruct on proper use of Esomeprazole (Nexium). Prescribe Famotidine for GI symptoms from steroid use. Continue Albuterol inhaler as needed. Follow-Up: After CT results; return if symptoms worsen.

## 2023-08-28 ENCOUNTER — Ambulatory Visit (INDEPENDENT_AMBULATORY_CARE_PROVIDER_SITE_OTHER): Payer: 59

## 2023-08-28 DIAGNOSIS — K449 Diaphragmatic hernia without obstruction or gangrene: Secondary | ICD-10-CM | POA: Diagnosis not present

## 2023-08-28 DIAGNOSIS — Z7952 Long term (current) use of systemic steroids: Secondary | ICD-10-CM

## 2023-08-28 DIAGNOSIS — R079 Chest pain, unspecified: Secondary | ICD-10-CM | POA: Diagnosis not present

## 2023-08-28 DIAGNOSIS — R0602 Shortness of breath: Secondary | ICD-10-CM

## 2023-08-28 DIAGNOSIS — J189 Pneumonia, unspecified organism: Secondary | ICD-10-CM

## 2023-08-28 DIAGNOSIS — I2510 Atherosclerotic heart disease of native coronary artery without angina pectoris: Secondary | ICD-10-CM

## 2023-08-28 DIAGNOSIS — R0789 Other chest pain: Secondary | ICD-10-CM | POA: Diagnosis not present

## 2023-08-28 DIAGNOSIS — R042 Hemoptysis: Secondary | ICD-10-CM

## 2023-08-28 DIAGNOSIS — R059 Cough, unspecified: Secondary | ICD-10-CM | POA: Diagnosis not present

## 2023-08-28 MED ORDER — IOHEXOL 350 MG/ML SOLN
100.0000 mL | Freq: Once | INTRAVENOUS | Status: AC | PRN
  Administered 2023-08-28: 80 mL via INTRAVENOUS

## 2023-08-28 MED ORDER — DOXYCYCLINE HYCLATE 100 MG PO TABS: 100.0000 mg | ORAL_TABLET | Freq: Two times a day (BID) | ORAL | 0 refills | Status: AC

## 2023-08-28 NOTE — Telephone Encounter (Signed)
[employer]PS[employer]- MCD Abbie Sons: [id number] (exp. 08/15/23 to 11/13/23)   Patient is scheduled at [facility] for 10/16/23 at 8 pm.  Also mailed packet to the patient.

## 2023-08-30 ENCOUNTER — Telehealth: Payer: Self-pay | Admitting: Family Medicine

## 2023-08-30 NOTE — Telephone Encounter (Signed)
Contacted patient and provided results. Patient verbalized understanding.   

## 2023-08-30 NOTE — Telephone Encounter (Signed)
Pt returned your call you were with a pt.

## 2023-08-31 DIAGNOSIS — H5213 Myopia, bilateral: Secondary | ICD-10-CM | POA: Diagnosis not present

## 2023-09-03 DIAGNOSIS — Z419 Encounter for procedure for purposes other than remedying health state, unspecified: Secondary | ICD-10-CM | POA: Diagnosis not present

## 2023-09-05 ENCOUNTER — Other Ambulatory Visit: Payer: Self-pay

## 2023-09-05 ENCOUNTER — Other Ambulatory Visit: Payer: Self-pay | Admitting: Allergy and Immunology

## 2023-09-05 ENCOUNTER — Encounter: Payer: Self-pay | Admitting: Allergy and Immunology

## 2023-09-05 ENCOUNTER — Ambulatory Visit (INDEPENDENT_AMBULATORY_CARE_PROVIDER_SITE_OTHER): Payer: 59 | Admitting: Allergy and Immunology

## 2023-09-05 ENCOUNTER — Other Ambulatory Visit (HOSPITAL_COMMUNITY): Payer: Self-pay | Admitting: Psychiatry

## 2023-09-05 ENCOUNTER — Telehealth: Payer: Self-pay | Admitting: Family Medicine

## 2023-09-05 VITALS — BP 126/84 | HR 82 | Temp 98.6°F | Resp 16 | Ht 66.0 in | Wt 202.3 lb

## 2023-09-05 DIAGNOSIS — F1010 Alcohol abuse, uncomplicated: Secondary | ICD-10-CM

## 2023-09-05 DIAGNOSIS — R430 Anosmia: Secondary | ICD-10-CM | POA: Diagnosis not present

## 2023-09-05 DIAGNOSIS — K219 Gastro-esophageal reflux disease without esophagitis: Secondary | ICD-10-CM | POA: Diagnosis not present

## 2023-09-05 DIAGNOSIS — J4550 Severe persistent asthma, uncomplicated: Secondary | ICD-10-CM

## 2023-09-05 DIAGNOSIS — J301 Allergic rhinitis due to pollen: Secondary | ICD-10-CM

## 2023-09-05 DIAGNOSIS — H6993 Unspecified Eustachian tube disorder, bilateral: Secondary | ICD-10-CM | POA: Diagnosis not present

## 2023-09-05 DIAGNOSIS — Z8709 Personal history of other diseases of the respiratory system: Secondary | ICD-10-CM | POA: Diagnosis not present

## 2023-09-05 DIAGNOSIS — J3089 Other allergic rhinitis: Secondary | ICD-10-CM

## 2023-09-05 DIAGNOSIS — L2089 Other atopic dermatitis: Secondary | ICD-10-CM

## 2023-09-05 DIAGNOSIS — F1210 Cannabis abuse, uncomplicated: Secondary | ICD-10-CM

## 2023-09-05 DIAGNOSIS — F4312 Post-traumatic stress disorder, chronic: Secondary | ICD-10-CM

## 2023-09-05 DIAGNOSIS — F331 Major depressive disorder, recurrent, moderate: Secondary | ICD-10-CM

## 2023-09-05 MED ORDER — FAMOTIDINE 40 MG PO TABS: 40.0000 mg | ORAL_TABLET | Freq: Every day | ORAL | 5 refills | Status: DC

## 2023-09-05 MED ORDER — ALBUTEROL SULFATE HFA 108 (90 BASE) MCG/ACT IN AERS: INHALATION_SPRAY | RESPIRATORY_TRACT | 1 refills | Status: DC

## 2023-09-05 MED ORDER — MOMETASONE FURO-FORMOTEROL FUM 200-5 MCG/ACT IN AERO: 2.0000 | INHALATION_SPRAY | Freq: Two times a day (BID) | RESPIRATORY_TRACT | 5 refills | Status: DC

## 2023-09-05 MED ORDER — MOMETASONE FUROATE 0.1 % EX OINT: TOPICAL_OINTMENT | Freq: Every day | CUTANEOUS | 1 refills | Status: DC

## 2023-09-05 MED ORDER — ESOMEPRAZOLE MAGNESIUM 40 MG PO CPDR: 40.0000 mg | DELAYED_RELEASE_CAPSULE | Freq: Every morning | ORAL | 5 refills | Status: DC

## 2023-09-05 MED ORDER — LORATADINE 10 MG PO TABS: 10.0000 mg | ORAL_TABLET | Freq: Two times a day (BID) | ORAL | 1 refills | Status: DC

## 2023-09-05 MED ORDER — FLUTICASONE PROPIONATE 50 MCG/ACT NA SUSP: 2.0000 | Freq: Every morning | NASAL | 5 refills | Status: DC

## 2023-09-05 NOTE — Progress Notes (Unsigned)
[place] - [place] - [place] - [place] - [place]   Follow-up Note  Referring Provider: Garnette Gunner, MD Primary Provider: Garnette Gunner, MD Date of Office Visit: 09/05/2023  Subjective:   Renee Cross (DOB: 1975-11-24) is a 47 y.o. female who [facility] in re-evaluation of the following:  HPI: Dontia returns to this clinic in evaluation of asthma, allergic rhinitis, atopic dermatitis, history of nasal polyposis, history of reflux.  I last saw her in this clinic 13 June 2023.  She has apparently been diagnosed with a "pneumonia" and required a CT scan of her chest and is now on doxycycline.  She had an event with constant cough and congestion of her chest and just felt bad in general for about a month.  She is feeling a lot better at this point in time.  She has had 3 days of doxycycline.  She is having difficulty using her triple inhaler because it is a powder device and it gives her instantaneous laryngeal spasm and stridor and were not really sure exactly how much of this medication she is actually receiving.  She uses a bronchodilator on a pretty extensive basis.  She does not really exercise.  Her nose is congested and she cannot really smell that well which has been a persistent issue for a while now.  Her last visit to ENT was about 2 years ago and her last sinus CT scan was many years prior to that point in time.  Her reflux is still intermittently active especially if she eats the wrong food while using Nexium 40 mg once a day.  She will have mid chest and sometimes hide chest regurgitation and on a rare occasion all the way up into her throat.  Her skin is doing pretty well while using a topical mometasone about every day usually to her back and sometimes her arms.  She still has secondhand tobacco smoke exposure on an intermittent basis.  Allergies as of 09/05/2023   No Known Allergies      Medication  List    albuterol (2.5 MG/3ML) 0.083% nebulizer solution Commonly known as: PROVENTIL Take 3 mLs (2.5 mg total) by nebulization every 4 (four) hours as needed for wheezing or shortness of breath.   albuterol 108 (90 Base) MCG/ACT inhaler Commonly known as: VENTOLIN HFA 2 puffs every 4-6 hours as needed for cough or wheeze.   benzonatate 200 MG capsule Commonly known as: TESSALON Take 1 capsule (200 mg total) by mouth 2 (two) times daily as needed for cough.   cetirizine 10 MG tablet Commonly known as: ZYRTEC Take 1 tablet (10 mg total) by mouth daily. 1 po q day prn allergies   cyanocobalamin 1000 MCG tablet Commonly known as: VITAMIN B12 Take 1 tablet (1,000 mcg total) by mouth daily.   DULoxetine 30 MG capsule Commonly known as: Cymbalta Take 1 capsule (30 mg total) by mouth daily.   esomeprazole 40 MG capsule Commonly known as: NexIUM Take 1 capsule (40 mg total) by mouth daily.   famotidine 40 MG tablet Commonly known as: PEPCID Take 1 tablet (40 mg total) by mouth at bedtime.   ferrous sulfate 325 (65 FE) MG EC tablet Take 325 mg by mouth daily.   fluticasone 50 MCG/ACT nasal spray Commonly known as: FLONASE Place 2 sprays into both nostrils every morning. 2 sprays per nostril every day for stuffy nose or drainage.   ipratropium-albuterol 0.5-2.5 (3) MG/3ML Soln Commonly  known as: DUONEB TAKE 3 MLS BY NEBULIZATION EVERY 4 (FOUR) HOURS AS NEEDED.   IRON (FERROUS GLUCONATE) PO Take by mouth.   loratadine 10 MG tablet Commonly known as: Claritin Take 1 tablet (10 mg total) by mouth 2 (two) times daily.   meloxicam 15 MG tablet Commonly known as: MOBIC Take 1 tablet (15 mg total) by mouth daily. For 2 weeks for pain and inflammation. Then take as needed   methocarbamol 500 MG tablet Commonly known as: ROBAXIN Take 1 tablet (500 mg total) by mouth every 8 (eight) hours as needed for muscle spasms.   mometasone 0.1 % ointment Commonly known as:  ELOCON Apply topically daily. Apply once daily to red itchy areas or until resolved   montelukast 10 MG tablet Commonly known as: SINGULAIR Take 1 tablet (10 mg total) by mouth at bedtime.   multivitamin with minerals Tabs tablet Take 1 tablet by mouth daily.   Olmesartan-amLODIPine-HCTZ 40-10-25 MG Tabs Take 1 tablet by mouth daily.   Olopatadine HCl 0.2 % Soln Commonly known as: Pataday Place 1 drop into both eyes daily as needed (allergies).   promethazine-dextromethorphan 6.25-15 MG/5ML syrup Commonly known as: PROMETHAZINE-DM Take 5 mLs by mouth 4 (four) times daily as needed.   QUEtiapine 50 MG tablet Commonly known as: SEROQUEL Take 1 tablet (50 mg total) by mouth at bedtime.   sennosides-docusate sodium 8.6-50 MG tablet Commonly known as: SENOKOT-S Take 2 tablets by mouth daily. To help prevent constipation while taking pain medication   Trelegy Ellipta 200-62.5-25 MCG/ACT Aepb Generic drug: Fluticasone-Umeclidin-Vilant Inhale 1 puff into the lungs in the morning.    Past Medical History:  Diagnosis Date   Anemia    Arthritis    Asthma    Depression    GERD (gastroesophageal reflux disease)    Hiatal hernia with GERD    Hypertension    Pneumonia    PTSD (post-traumatic stress disorder)    Sickle cell trait (HCC)     Past Surgical History:  Procedure Laterality Date   HERNIA REPAIR     SINUS EXPLORATION     STOMACH SURGERY  10/03/2001   Possible fundiplication?   TONSILLECTOMY     TOTAL HIP ARTHROPLASTY Right 02/14/2023   Procedure: TOTAL HIP ARTHROPLASTY ANTERIOR APPROACH;  Surgeon: Sheral Apley, MD;  Location: [facility];  Service: Orthopedics;  Laterality: Right;   TUBAL LIGATION      Review of systems negative except as noted in HPI / PMHx or noted below:  Review of Systems  Constitutional: Negative.   HENT: Negative.    Eyes: Negative.   Respiratory: Negative.    Cardiovascular: Negative.   Gastrointestinal: Negative.   Genitourinary:  Negative.   Musculoskeletal: Negative.   Skin: Negative.   Neurological: Negative.   Endo/Heme/Allergies: Negative.   Psychiatric/Behavioral: Negative.       Objective:   Vitals:   09/05/23 1156  BP: 126/84  Pulse: 82  Resp: 16  Temp: 98.6 F (37 C)  SpO2: 99%   Height: 5\' 6"  (167.6 cm)  Weight: 202 lb 4.8 oz (91.8 kg)   Physical Exam Constitutional:      Appearance: She is not diaphoretic.  HENT:     Head: Normocephalic.     Right Ear: Ear canal and external ear normal. A middle ear effusion is present.     Left Ear: Ear canal and external ear normal. A middle ear effusion is present.     Nose: Nose normal. No mucosal edema or  rhinorrhea.     Mouth/Throat:     Pharynx: Uvula midline. No oropharyngeal exudate.  Eyes:     Conjunctiva/sclera: Conjunctivae normal.  Neck:     Thyroid: No thyromegaly.     Trachea: Trachea normal. No tracheal tenderness or tracheal deviation.  Cardiovascular:     Rate and Rhythm: Normal rate and regular rhythm.     Heart sounds: Normal heart sounds, S1 normal and S2 normal. No murmur heard. Pulmonary:     Effort: No respiratory distress.     Breath sounds: Normal breath sounds. No stridor. No wheezing or rales.  Lymphadenopathy:     Head:     Right side of head: No tonsillar adenopathy.     Left side of head: No tonsillar adenopathy.     Cervical: No cervical adenopathy.  Skin:    Findings: No erythema or rash.     Nails: There is no clubbing.  Neurological:     Mental Status: She is alert.     Diagnostics:    Spirometry was performed and demonstrated an FEV1 of 2.05 at 76 % of predicted.  Results of a chest CT angio obtained 28 August 2023 identifies the following:  Cardiovascular: Adequate contrast bolus timing in the pulmonary arterial tree. No pulmonary artery filling defect identified. Minimal respiratory motion.   Heart size is normal. No pericardial effusion. Negative visible aorta. Questionable calcified left  coronary artery plaque on series 10, image 166.   Mediastinum/Nodes: No mediastinal mass or lymphadenopathy. Small chronic gastric hiatal hernia appears stable since 2015.   Lungs/Pleura: Major airways are patent with minimal retained secretions in the trachea. Mild bronchial wall thickening affecting segmental and subsegmental airways. Subtle tree-in-bud nodular opacity in the upper lobes. No consolidation. No pleural effusion. No suspicious lung nodule.  Results of blood tests obtained 25 August 2023 identified WBC 7.7, absolute eosinophil 0, absolute basophil 100, absolute lymphocyte 1600, hemoglobin 13.3, platelet 205.  Assessment and Plan:   1. Not well controlled severe persistent asthma   2. Perennial allergic rhinitis   3. Seasonal allergic rhinitis due to pollen   4. Other atopic dermatitis   5. History of nasal polyposis   6. Anosmia   7. Dysfunction of both eustachian tubes   8. Gastroesophageal reflux disease without esophagitis    1.  Allergen avoidance measures -dust, mold, pollens  2.  Treat and prevent inflammation:  A. Dulera 200 HFA - 2 inhalation 2 time per day (empty lungs) B. Flonase - 2 spray each nostril 1 time per day C. Continue mepolizumab injections in [place] D. Tammy will arrange for Tezepelumab injections in 2025  3. Treat and prevent reflux:  A. Nexium 40 mg - 1 tablet in AM B. Famotidine 40 mg - 1 tablet in PM  4. If needed:   A. Albuterol HFA - 2 inhalations every 4-6 hours B. Loratadine 10 mg - 1 tablet 1-2 times per day C. Shower followed by mometasone 0.1% ointment daily   5. Return to clinic in 12 weeks or earlier if problem  6. Visit with ENT about ears and smelling issue - Dr. Christell Constant in [facility]  7. Finish doxycycline  I believe that Alia would do much better using tezepelumab to control her multiorgan mucosal inflammatory condition but her insurance company has blocked the use of this medication.  In January 2025 she  will only have Medicaid and we will reapply to [facility] for obtaining tezepelumab.  She cannot tolerate the powdered triple inhaler and  we will give her Dulera.  Her reflux is active so she will use famotidine in addition to Nexium.  We will have her reevaluated by ENT about her eustachian tube dysfunction and her anosmia.  I will see her back in this clinic in 12 weeks.  Renee Schimke, MD Allergy / Immunology [facility]

## 2023-09-05 NOTE — Patient Instructions (Addendum)
  1.  Allergen avoidance measures -dust, mold, pollens  2.  Treat and prevent inflammation:  A. Dulera 200 HFA - 2 inhalation 2 time per day (empty lungs) B. Flonase - 2 spray each nostril 1 time per day C. Continue mepolizumab injections in [place] D. Tammy will arrange for Tezepelumab injections in 2025  3. Treat and prevent reflux:  A. Nexium 40 mg - 1 tablet in AM B. Famotidine 40 mg - 1 tablet in PM  4. If needed:   A. Albuterol HFA - 2 inhalations every 4-6 hours B. Loratadine 10 mg - 1 tablet 1-2 times per day C. Shower followed by mometasone 0.1% ointment daily   5. Return to clinic in 12 weeks or earlier if problem  6. Visit with ENT about ears and smelling issue - Dr. Christell Constant in [facility]  7. Finish doxycycline

## 2023-09-05 NOTE — Telephone Encounter (Signed)
Pt called wanting a call back at [phone] from CMA to let her know when she should be seen next.

## 2023-09-06 ENCOUNTER — Other Ambulatory Visit: Payer: Self-pay | Admitting: Family Medicine

## 2023-09-06 ENCOUNTER — Encounter: Payer: Self-pay | Admitting: Allergy and Immunology

## 2023-09-06 ENCOUNTER — Other Ambulatory Visit: Payer: Self-pay | Admitting: Allergy and Immunology

## 2023-09-06 ENCOUNTER — Other Ambulatory Visit (HOSPITAL_COMMUNITY): Payer: Self-pay

## 2023-09-06 ENCOUNTER — Telehealth: Payer: Self-pay | Admitting: Internal Medicine

## 2023-09-06 DIAGNOSIS — J4551 Severe persistent asthma with (acute) exacerbation: Secondary | ICD-10-CM

## 2023-09-06 NOTE — Telephone Encounter (Signed)
Spoke with pt and told her to call the office to schedule an appt if she feels the need to be seen.

## 2023-09-06 NOTE — Telephone Encounter (Signed)
Patient called stating Dr. Dimple Casey referred her for an MRI of her knee last month and hasn't received a call to discuss the results.

## 2023-09-06 NOTE — Telephone Encounter (Signed)
INSURANCE DOES NOT COVER DULERA prefer wixela advise to change

## 2023-09-06 NOTE — Telephone Encounter (Signed)
Benefits investigation shows that patient has 2 active plans. Medication is covered under one of these plans for a $4.00 copay. This is the plan information that covers medication with no PA needed

## 2023-09-06 NOTE — Telephone Encounter (Signed)
Patient had an MRI on the right knee on 08/18/2023. Please advise.

## 2023-09-06 NOTE — Telephone Encounter (Signed)
According to formulay breo, pulmicort teisthaler, and breztri are covered all others are non formulary

## 2023-09-07 ENCOUNTER — Ambulatory Visit (INDEPENDENT_AMBULATORY_CARE_PROVIDER_SITE_OTHER): Payer: 59 | Admitting: Licensed Clinical Social Worker

## 2023-09-07 DIAGNOSIS — F331 Major depressive disorder, recurrent, moderate: Secondary | ICD-10-CM | POA: Diagnosis not present

## 2023-09-07 NOTE — Telephone Encounter (Signed)
 Now addressed in result note

## 2023-09-07 NOTE — Progress Notes (Signed)
I spoke with Ms. 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  Based on this result I think she would benefit to follow-up in orthopedic surgery clinic likely needs arthroscopic debridement.  She is already established but states it will be a while to get back in since she needs to pay a bill.  Based on this report I do not see evidence that rheumatoid arthritis is a current cause of her knee pain and swelling so I do not think we need to follow-up here unless there are new changes in symptoms.

## 2023-09-07 NOTE — Progress Notes (Signed)
THERAPIST PROGRESS NOTE   Session Date: 09/07/2023  Session Time: 1112 - 1156  Participation Level: Active  Behavioral Response: Casual, Neat, and Well GroomedAlertDepressed and Irritable  Type of Therapy: Individual Therapy  Treatment Goals addressed:  - LTG: Reduce frequency, intensity, and duration of depression symptoms so that daily functioning is improved (OP Depression) - LTG: Increase coping skills to manage depression and improve ability to perform daily activities (OP Depression) - STG: Renee Cross will identify cognitive patterns and beliefs that support depression (OP Depression) - STG: Renee Cross will reduce frequency of avoidant behaviors by 50% as evidenced by self-report in therapy sessions (OP Depression) - LTG: "Improve my attitude and be able to go out and enjoy things and people" (OP Depression)  ProgressTowards Goals: Progressing  Interventions: CBT, Motivational Interviewing, Solution Focused, and Supportive  Summary: Renee Cross is a 47 y.o. female with past psych history of MDD, presenting for follow-up therapy session in efforts to improve management of depressive symptoms. Patient actively engaged in session, participating in completion of PHQ9 and GAD7 measures, in exploration of presentation of sxs and severity. Processed variances in severity of reported sxs, noting significant reduction in anxious sxs and minor increased in depressive sxs. Further engaged in exploration of recent events, providing detailed recounts of past 2 weeks since previous session, sharing of tasks and events patient has engaged in surrounding the holidays and overall holiday experience. Patient further detailed experiencing increased depressive sxs over the holiday due to Thanksgiving being a holiday patient found special to both she and her mother, who passed in 2020.  Patient further shared of limited interaction with her children over the holidays due to estranged relationship with her oldest  daughter and youngest daughter spending time with boyfriend's family.  Patient further engaged in exploration of recently learning as having walking pneumonia since October, expressing frustrations surrounding the duration of sickness, and additional stress surrounding further medical complications found in recent CT scan.  Patient actively engaged in processing environmental factors that proved to be unhealthy and supporting continued depressive state.  Patient responded well to interventions. Patient continues to meet criteria for MDD. Patient will continue to benefit from engagement in outpatient therapy due to being the least restrictive service to meet presenting needs.      09/07/2023   11:15 AM 08/23/2023    1:13 PM 05/16/2023   11:54 AM 01/31/2023    1:42 PM  GAD 7 : Generalized Anxiety Score  Nervous, Anxious, on Edge 0 1 0 1  Control/stop worrying 1 3 2 2   Worry too much - different things 1 1 2 1   Trouble relaxing 1 1 3  0  Restless 0 0 2 1  Easily annoyed or irritable 1 3 1 1   Afraid - awful might happen 3 3 3 1   Total GAD 7 Score 7 12 13 7   Anxiety Difficulty Somewhat difficult Somewhat difficult Somewhat difficult Somewhat difficult      09/07/2023   11:18 AM 08/23/2023    1:17 PM 05/16/2023   11:54 AM 01/31/2023    1:41 PM 12/22/2022    3:32 PM  Depression screen PHQ 2/9  Decreased Interest 3 3 2 2 2   Down, Depressed, Hopeless 1 1 1 2 1   PHQ - 2 Score 4 4 3 4 3   Altered sleeping 3 3 2 1 2   Tired, decreased energy 3 3 2 2 2   Change in appetite 3 1 2 1 1   Feeling bad or failure about yourself  1 1 2  0  2  Trouble concentrating 1 1 0 1 1  Moving slowly or fidgety/restless 1 1 3 1 3   Suicidal thoughts 0 0 0 0 0  PHQ-9 Score 16 14 14 10 14   Difficult doing work/chores Somewhat difficult Somewhat difficult Very difficult Somewhat difficult Very difficult     Suicidal/Homicidal: No  Therapist Response: Clinician utilized CBT, MI, Solution Focused, and Supportive reflection  techniques to address and support pt in navigating presenting MH sxs.  Actively engage patient in readministering of PHQ-9 and GAD-7, further exploring patient's awareness of symptoms and thoughts and feelings surrounding reductions in anxious symptoms severity and minor increase in severity of depressive symptoms.  Further processed factors that impacted changes in presenting symptoms and stressors, reflecting on holidays, current and historical medical complications, and patient's overall behaviors that are proving to be unhealthy/maladaptive in the management of depression.  Assigned patient homework to begin tracking moods, building into nighttime routine, tracking morning, afternoon, and evening moods as well as factors and/or contributing stressors that impact mood variances. Therapist provided support and empathy to patient during session.  Plan: Return again in 2 weeks.  Diagnosis:  Encounter Diagnosis  Name Primary?   MDD (major depressive disorder), recurrent episode, moderate (HCC) Yes    Collaboration of Care: Other None necessary at this time.  Patient/Guardian was advised Release of Information must be obtained prior to any record release in order to collaborate their care with an outside provider. Patient/Guardian was advised if they have not already done so to contact the registration department to sign all necessary forms in order for [place] to release information regarding their care.   Consent: Patient/Guardian gives verbal consent for treatment and assignment of benefits for services provided during this visit. Patient/Guardian expressed understanding and agreed to proceed.   Leisa Lenz, MSW, LCSW 09/07/2023,  11:25 AM

## 2023-09-08 ENCOUNTER — Ambulatory Visit (INDEPENDENT_AMBULATORY_CARE_PROVIDER_SITE_OTHER): Payer: 59

## 2023-09-08 DIAGNOSIS — J4550 Severe persistent asthma, uncomplicated: Secondary | ICD-10-CM | POA: Diagnosis not present

## 2023-09-08 DIAGNOSIS — J339 Nasal polyp, unspecified: Secondary | ICD-10-CM | POA: Diagnosis not present

## 2023-09-08 NOTE — Telephone Encounter (Signed)
Left patient a detailed voice message to return call to office.  Mychart message being sent as well.

## 2023-09-12 ENCOUNTER — Other Ambulatory Visit: Payer: Self-pay

## 2023-09-12 DIAGNOSIS — J45909 Unspecified asthma, uncomplicated: Secondary | ICD-10-CM | POA: Insufficient documentation

## 2023-09-12 DIAGNOSIS — J189 Pneumonia, unspecified organism: Secondary | ICD-10-CM | POA: Insufficient documentation

## 2023-09-12 DIAGNOSIS — F4310 Post-traumatic stress disorder, unspecified: Secondary | ICD-10-CM | POA: Insufficient documentation

## 2023-09-12 DIAGNOSIS — F32A Depression, unspecified: Secondary | ICD-10-CM | POA: Insufficient documentation

## 2023-09-12 DIAGNOSIS — Z889 Allergy status to unspecified drugs, medicaments and biological substances status: Secondary | ICD-10-CM | POA: Insufficient documentation

## 2023-09-12 DIAGNOSIS — K449 Diaphragmatic hernia without obstruction or gangrene: Secondary | ICD-10-CM | POA: Insufficient documentation

## 2023-09-12 DIAGNOSIS — D573 Sickle-cell trait: Secondary | ICD-10-CM | POA: Insufficient documentation

## 2023-09-12 DIAGNOSIS — M1990 Unspecified osteoarthritis, unspecified site: Secondary | ICD-10-CM | POA: Insufficient documentation

## 2023-09-12 DIAGNOSIS — I10 Essential (primary) hypertension: Secondary | ICD-10-CM | POA: Insufficient documentation

## 2023-09-12 HISTORY — DX: Allergy status to unspecified drugs, medicaments and biological substances: Z88.9

## 2023-09-12 HISTORY — DX: Unspecified asthma, uncomplicated: J45.909

## 2023-09-13 ENCOUNTER — Encounter: Payer: Self-pay | Admitting: Cardiology

## 2023-09-13 ENCOUNTER — Ambulatory Visit: Payer: 59 | Attending: Cardiology | Admitting: Cardiology

## 2023-09-13 VITALS — BP 146/76 | HR 81 | Ht 66.0 in | Wt 205.0 lb

## 2023-09-13 DIAGNOSIS — R079 Chest pain, unspecified: Secondary | ICD-10-CM

## 2023-09-13 DIAGNOSIS — I10 Essential (primary) hypertension: Secondary | ICD-10-CM | POA: Diagnosis not present

## 2023-09-13 NOTE — Progress Notes (Signed)
Cardiology Office Note:    Date:  09/13/2023   ID:  Renee Cross, DOB 1976/07/02, MRN [id number]  PCP:  Garnette Gunner, MD  Cardiologist:  Garwin Brothers, MD   Referring MD: Garnette Gunner, MD    ASSESSMENT:    1. Primary hypertension    PLAN:    In order of problems listed above:  Primary prevention stressed with the patient.  Importance of compliance with diet medication stressed and patient verbalized standing. Chest tightness: Patient has multiple risk factors for coronary artery disease.  Will do a Lexiscan sestamibi to assess her. Essential hypertension: Blood pressure is stable and diet was emphasized.  Lifestyle modification urged.  She keeps a track of blood pressures at home. Obesity: Weight reduction stressed diet emphasized.  Risks of obesity explained.  I reviewed her lipids and they are unremarkable. Cardiac murmur: Echocardiogram will be done to assess murmur heard on auscultation. Patient will be seen in follow-up appointment in 6 months or earlier if the patient has any concerns.    Medication Adjustments/Labs and Tests Ordered: Current medicines are reviewed at length with the patient today.  Concerns regarding medicines are outlined above.  Orders Placed This Encounter  Procedures   EKG 12-Lead   No orders of the defined types were placed in this encounter.    History of Present Illness:    Renee Cross is a 47 y.o. female who is being seen today for the evaluation of chest tightness at the request of Garnette Gunner, MD. patient is a pleasant 47 year old female.  She has past medical history of essential hypertension.  She denies any history of diabetes mellitus.  She mentions to me that occasionally she will have chest tightness.  Her CT scan of the chest revealed possibility of calcium in the left anterior descending artery but the results were not clear.  No orthopnea or PND.  No radiation of the symptoms to the neck or to the arms.  She has  orthopedic issues and has had hip replacement.  She does not ambulate much.  At the time of my evaluation, the patient is alert awake oriented and in no distress.   Past Medical History:  Diagnosis Date   Abnormal uterine bleeding (AUB) 12/22/2022   Acute recurrent sinusitis 01/27/2021   Alcohol use disorder 01/14/2023   Amblyopia of left eye 12/22/2022   Anemia    Arthritis    Asthma 09/12/2023   B12 deficiency 01/14/2023   Bilateral knee pain 06/28/2023   Class 1 drug-induced obesity with serious comorbidity and body mass index (BMI) of 33.0 to 33.9 in adult 08/26/2023   Depression    Elevated rheumatoid factor 12/28/2021   Encounter for disability assessment 12/22/2022   Episode of recurrent major depressive disorder (HCC) 01/14/2023   Financial difficulty 01/14/2023   Gastroesophageal reflux disease 12/22/2022   Hiatal hernia with GERD    Hip arthritis 12/22/2022   History of suicide attempt 01/14/2023   Hypertension    Iron deficiency anemia due to chronic blood loss 03/31/2022   Left groin mass 09/17/2020   MDD (major depressive disorder), recurrent episode, moderate (HCC) 08/23/2023   Microcytic hypochromic anemia 12/28/2021   Multiple allergies 09/12/2023   Nasal polyp 01/27/2021   Polypectomy over 20 years ago.     Non-recurrent acute suppurative otitis media of left ear without spontaneous rupture of tympanic membrane 05/16/2023   Osteoarthritis of both hips 01/21/2022   Osteoarthritis of right knee 07/02/2023   Overuse of medication  12/22/2022   Perennial allergic rhinitis with seasonal variation 12/22/2022   Pneumonia    Primary hypertension 12/22/2022   Primary osteoarthritis of both knees 01/21/2022   PTSD (post-traumatic stress disorder)    Rheumatoid arthritis (HCC) 12/22/2022   Screening for colon cancer 08/26/2023   Severe persistent asthma with exacerbation 12/22/2022   Sickle cell trait (HCC)    Tobacco use disorder 05/16/2023    Past Surgical  History:  Procedure Laterality Date   HERNIA REPAIR     SINUS EXPLORATION     STOMACH SURGERY  10/03/2001   Possible fundiplication?   TONSILLECTOMY     TOTAL HIP ARTHROPLASTY Right 02/14/2023   Procedure: TOTAL HIP ARTHROPLASTY ANTERIOR APPROACH;  Surgeon: Sheral Apley, MD;  Location: [facility];  Service: Orthopedics;  Laterality: Right;   TUBAL LIGATION      Current Medications: Current Meds  Medication Sig   albuterol (PROVENTIL) (2.5 MG/3ML) 0.083% nebulizer solution Take 3 mLs (2.5 mg total) by nebulization every 4 (four) hours as needed for wheezing or shortness of breath.   albuterol (VENTOLIN HFA) 108 (90 Base) MCG/ACT inhaler 2 puffs every 4-6 hours as needed for cough or wheeze.   benzonatate (TESSALON) 200 MG capsule Take 1 capsule (200 mg total) by mouth 2 (two) times daily as needed for cough.   cetirizine (ZYRTEC) 10 MG tablet Take 1 tablet (10 mg total) by mouth daily. 1 po q day prn allergies   cyanocobalamin (VITAMIN B12) 1000 MCG tablet Take 1 tablet (1,000 mcg total) by mouth daily.   DULoxetine (CYMBALTA) 30 MG capsule Take 1 capsule (30 mg total) by mouth daily.   esomeprazole (NEXIUM) 40 MG capsule Take 1 capsule (40 mg total) by mouth every morning.   famotidine (PEPCID) 40 MG tablet Take 1 tablet (40 mg total) by mouth at bedtime.   ferrous sulfate 325 (65 FE) MG EC tablet Take 325 mg by mouth daily.   fluticasone (FLONASE) 50 MCG/ACT nasal spray Place 2 sprays into both nostrils every morning. 2 sprays per nostril every day for stuffy nose or drainage.   Fluticasone-Umeclidin-Vilant (TRELEGY ELLIPTA) 200-62.5-25 MCG/ACT AEPB Inhale 1 puff into the lungs in the morning.   ipratropium-albuterol (DUONEB) 0.5-2.5 (3) MG/3ML SOLN TAKE 3 MLS BY NEBULIZATION EVERY 4 (FOUR) HOURS AS NEEDED.   IRON, FERROUS GLUCONATE, PO Take by mouth.   loratadine (CLARITIN) 10 MG tablet Take 1 tablet (10 mg total) by mouth 2 (two) times daily.   medroxyPROGESTERone (PROVERA) 10 MG  tablet Take 10 mg by mouth daily.   meloxicam (MOBIC) 15 MG tablet Take 1 tablet (15 mg total) by mouth daily. For 2 weeks for pain and inflammation. Then take as needed   methocarbamol (ROBAXIN) 500 MG tablet Take 1 tablet (500 mg total) by mouth every 8 (eight) hours as needed for muscle spasms.   mometasone (ELOCON) 0.1 % ointment Apply topically daily. Apply once daily to red itchy areas or until resolved   montelukast (SINGULAIR) 10 MG tablet Take 1 tablet (10 mg total) by mouth at bedtime.   Multiple Vitamin (MULTIVITAMIN WITH MINERALS) TABS tablet Take 1 tablet by mouth daily.   Olmesartan-amLODIPine-HCTZ 40-10-25 MG TABS Take 1 tablet by mouth daily.   Olopatadine HCl (PATADAY) 0.2 % SOLN Place 1 drop into both eyes daily as needed (allergies).   promethazine-dextromethorphan (PROMETHAZINE-DM) 6.25-15 MG/5ML syrup Take 5 mLs by mouth 4 (four) times daily as needed.   QUEtiapine (SEROQUEL) 50 MG tablet Take 1 tablet (50 mg total)  by mouth at bedtime.   sennosides-docusate sodium (SENOKOT-S) 8.6-50 MG tablet Take 2 tablets by mouth daily. To help prevent constipation while taking pain medication   Current Facility-Administered Medications for the 09/13/23 encounter (Office Visit) with Tashawna Thom, Aundra Dubin, MD  Medication   mepolizumab (NUCALA) SOSY 100 mg     Allergies:   Patient has no known allergies.   Social History   Socioeconomic History   Marital status: Single    Spouse name: Not on file   Number of children: Not on file   Years of education: Not on file   Highest education level: Not on file  Occupational History   Not on file  Tobacco Use   Smoking status: Former    Types: Cigars    Passive exposure: Never   Smokeless tobacco: Never   Tobacco comments:    Smokes black and mild occassionally  Vaping Use   Vaping status: Never Used  Substance and Sexual Activity   Alcohol use: Yes    Alcohol/week: 2.0 standard drinks of alcohol    Types: 2 Cans of beer per week    Drug use: Yes    Frequency: 4.0 times per week    Types: Marijuana    Comment: edibles   Sexual activity: Yes    Birth control/protection: Surgical  Other Topics Concern   Not on file  Social History Narrative   Caffiene 2 days / week 2 cans soda   Works not working.    Social Determinants of Health   Financial Resource Strain: Not on file  Food Insecurity: Not on file  Transportation Needs: Not on file  Physical Activity: Not on file  Stress: Not on file  Social Connections: Not on file     Family History: The patient's family history includes Cancer in her maternal aunt; Hypertension in her mother. There is no history of Heart disease.  ROS:   Please see the history of present illness.    All other systems reviewed and are negative.  EKGs/Labs/Other Studies Reviewed:    The following studies were reviewed today: .Marland KitchenEKG Interpretation Date/Time:  Wednesday September 13 2023 15:50:12 EST Ventricular Rate:  81 PR Interval:  126 QRS Duration:  80 QT Interval:  374 QTC Calculation: 434 R Axis:   19  Text Interpretation: Normal sinus rhythm Possible Anterior infarct (cited on or before 13-Sep-2023) When compared with ECG of 01-Feb-2023 11:48, No significant change was found Confirmed by Belva Crome [phone]) on 09/13/2023 4:06:13 PM         Recent Labs: 12/27/2022: TSH 2.98 08/25/2023: ALT 14; BUN 9; Creatinine, Ser 0.62; Hemoglobin 13.3; NT-Pro BNP 69; Platelets 205.0; Potassium 3.9; Sodium 138  Recent Lipid Panel    Component Value Date/Time   CHOL 163 12/27/2022 0902   TRIG 95.0 12/27/2022 0902   HDL 60.60 12/27/2022 0902   CHOLHDL 3 12/27/2022 0902   VLDL 19.0 12/27/2022 0902   LDLCALC 83 12/27/2022 0902    Physical Exam:    VS:  BP (!) 146/76   Pulse 81   Ht 5\' 6"  (1.676 m)   Wt 205 lb 0.6 oz (93 kg)   SpO2 97%   BMI 33.09 kg/m     Wt Readings from Last 3 Encounters:  09/13/23 205 lb 0.6 oz (93 kg)  09/05/23 202 lb 4.8 oz (91.8 kg)  08/25/23  207 lb 3.2 oz (94 kg)     GEN: Patient is in no acute distress HEENT: Normal NECK: No JVD; No carotid  bruits LYMPHATICS: No lymphadenopathy CARDIAC: S1 S2 regular, 2/6 systolic murmur at the apex. RESPIRATORY:  Clear to auscultation without rales, wheezing or rhonchi  ABDOMEN: Soft, non-tender, non-distended MUSCULOSKELETAL:  No edema; No deformity  SKIN: Warm and dry NEUROLOGIC:  Alert and oriented x 3 PSYCHIATRIC:  Normal affect    Signed, Garwin Brothers, MD  09/13/2023 4:06 PM    [facility]

## 2023-09-13 NOTE — Patient Instructions (Signed)
Medication Instructions:  Your physician recommends that you continue on your current medications as directed. Please refer to the Current Medication list given to you today.   *If you need a refill on your cardiac medications before your next appointment, please call your pharmacy*   Lab Work: None ordered If you have labs (blood work) drawn today and your tests are completely normal, you will receive your results only by: MyChart Message (if you have MyChart) OR A paper copy in the mail If you have any lab test that is abnormal or we need to change your treatment, we will call you to review the results.   Testing/Procedures:   [facility] Cardiovascular Imaging at [facility] [address], [address] [place], [place] [address] Phone: [phone]    Please arrive 15 minutes prior to your appointment time for registration and insurance purposes.  The test will take approximately 3 to 4 hours to complete; you may bring reading material.  If someone comes with you to your appointment, they will need to remain in the main lobby due to limited space in the testing area. **If you are pregnant or breastfeeding, please notify the nuclear lab prior to your appointment**  How to prepare for your Myocardial Perfusion Test: Do not eat or drink 3 hours prior to your test, except you may have water. Do not consume products containing caffeine (regular or decaffeinated) 12 hours prior to your test. (ex: coffee, chocolate, sodas, tea). Do bring a list of your current medications with you.  If not listed below, you may take your medications as normal. Do wear comfortable clothes (no dresses or overalls) and walking shoes, tennis shoes preferred (No heels or open toe shoes are allowed). Do NOT wear cologne, perfume, aftershave, or lotions (deodorant is allowed). If these instructions are not followed, your test will have to be rescheduled.  If you cannot keep your appointment, please  provide 24 hours notification to the Nuclear Lab, to avoid a possible $50 charge to your account. Please report to [address] for your test.  If you have questions or concerns about your appointment, you can call the Nuclear Lab at [phone].   If you cannot keep your appointment, please provide 24 hours notification to the Nuclear Lab, to avoid a possible $50 charge to your account.    Your physician has requested that you have an echocardiogram. Echocardiography is a painless test that uses sound waves to create images of your heart. It provides your doctor with information about the size and shape of your heart and how well your heart's chambers and valves are working. This procedure takes approximately one hour. There are no restrictions for this procedure. Please do NOT wear cologne, perfume, aftershave, or lotions (deodorant is allowed). Please arrive 15 minutes prior to your appointment time.  Please note: We ask at that you not bring children with you during ultrasound (echo/ vascular) testing. Due to room size and safety concerns, children are not allowed in the ultrasound rooms during exams. Our front office staff cannot provide observation of children in our lobby area while testing is being conducted. An adult accompanying a patient to their appointment will only be allowed in the ultrasound room at the discretion of the ultrasound technician under special circumstances. We apologize for any inconvenience.    Follow-Up: At [facility], you and your health needs are our priority.  As part of our continuing mission to provide you with exceptional heart  care, we have created designated Provider Care Teams.  These Care Teams include your primary Cardiologist (physician) and Advanced Practice Providers (APPs -  Physician Assistants and Nurse Practitioners) who all work together to provide you with the care you need, when you need it.  We recommend signing up  for the patient portal called "MyChart".  Sign up information is provided on this After Visit Summary.  MyChart is used to connect with patients for Virtual Visits (Telemedicine).  Patients are able to view lab/test results, encounter notes, upcoming appointments, etc.  Non-urgent messages can be sent to your provider as well.   To learn more about what you can do with MyChart, go to [email or web address].    Your next appointment:   12 month(s)  Provider:   Belva Crome, MD   Other Instructions  Cardiac Nuclear Scan A cardiac nuclear scan is a test that is done to check the flow of blood to your heart. It is done when you are resting and when you are exercising. The test looks for problems such as: Not enough blood reaching a portion of the heart. The heart muscle not working as it should. You may need this test if you have: Heart disease. Lab results that are not normal. Had heart surgery or a balloon procedure to open up blocked arteries (angioplasty) or a small mesh tube (stent). Chest pain. Shortness of breath. Had a heart attack. In this test, a special dye (tracer) is put into your bloodstream. The tracer will travel to your heart. A camera will then take pictures of your heart to see how the tracer moves through your heart. This test is usually done at a hospital and takes 2-4 hours. Tell a doctor about: Any allergies you have. All medicines you are taking, including vitamins, herbs, eye drops, creams, and over-the-counter medicines. Any bleeding problems you have. Any surgeries you have had. Any medical conditions you have. Whether you are pregnant or may be pregnant. Any history of asthma or long-term (chronic) lung disease. Any history of heart rhythm disorders or heart valve conditions. What are the risks? Your doctor will talk with you about risks. These may include: Serious chest pain and heart attack. This is only a risk if the stress portion of the test is  done. Fast or uneven heartbeats (palpitations). A feeling of warmth in your chest. This feeling usually does not last long. Allergic reaction to the tracer. Shortness of breath or trouble breathing. What happens before the test? Ask your doctor about changing or stopping your normal medicines. Follow instructions from your doctor about what you cannot eat or drink. Remove your jewelry on the day of the test. Ask your doctor if you need to avoid nicotine or caffeine. What happens during the test? An IV tube will be inserted into one of your veins. Your doctor will give you a small amount of tracer through the IV tube. You will wait for 20-40 minutes while the tracer moves through your bloodstream. Your heart will be monitored with an electrocardiogram (ECG). You will lie down on an exam table. Pictures of your heart will be taken for about 15-20 minutes. You may also have a stress test. For this test, one of these things may be done: You will be asked to exercise on a treadmill or a stationary bike. You will be given medicines that will make your heart work harder. This is done if you are unable to exercise. When blood flow to your heart has peaked, a  tracer will again be given through the IV tube. After 20-40 minutes, you will get back on the exam table. More pictures will be taken of your heart. Depending on the tracer that is used, more pictures may need to be taken 3-4 hours later. Your IV tube will be removed when the test is over. The test may vary among doctors and hospitals. What happens after the test? Ask your doctor: Whether you can return to your normal schedule, including diet, activities, travel, and medicines. Whether you should drink more fluids. This will help to remove the tracer from your body. Ask your doctor, or the department that is doing the test: When will my results be ready? How will I get my results? What are my treatment options? What other tests do I  need? What are my next steps? This information is not intended to replace advice given to you by your health care provider. Make sure you discuss any questions you have with your health care provider. Document Revised: 02/15/2022 Document Reviewed: 02/15/2022 Elsevier Patient Education  2023 Elsevier Inc.  Echocardiogram An echocardiogram is a test that uses sound waves (ultrasound) to produce images of the heart. Images from an echocardiogram can provide important information about: Heart size and shape. The size and thickness and movement of your heart's walls. Heart muscle function and strength. Heart valve function or if you have stenosis. Stenosis is when the heart valves are too narrow. If blood is flowing backward through the heart valves (regurgitation). A tumor or infectious growth around the heart valves. Areas of heart muscle that are not working well because of poor blood flow or injury from a heart attack. Aneurysm detection. An aneurysm is a weak or damaged part of an artery wall. The wall bulges out from the normal force of blood pumping through the body. Tell a health care provider about: Any allergies you have. All medicines you are taking, including vitamins, herbs, eye drops, creams, and over-the-counter medicines. Any blood disorders you have. Any surgeries you have had. Any medical conditions you have. Whether you are pregnant or may be pregnant. What are the risks? Generally, this is a safe test. However, problems may occur, including an allergic reaction to dye (contrast) that may be used during the test. What happens before the test? No specific preparation is needed. You may eat and drink normally. What happens during the test?  You will take off your clothes from the waist up and put on a hospital gown. Electrodes or electrocardiogram (ECG)patches may be placed on your chest. The electrodes or patches are then connected to a device that monitors your heart  rate and rhythm. You will lie down on a table for an ultrasound exam. A gel will be applied to your chest to help sound waves pass through your skin. A handheld device, called a transducer, will be pressed against your chest and moved over your heart. The transducer produces sound waves that travel to your heart and bounce back (or "echo" back) to the transducer. These sound waves will be captured in real-time and changed into images of your heart that can be viewed on a video monitor. The images will be recorded on a computer and reviewed by your health care provider. You may be asked to change positions or hold your breath for a short time. This makes it easier to get different views or better views of your heart. In some cases, you may receive contrast through an IV in one of your veins.  This can improve the quality of the pictures from your heart. The procedure may vary among health care providers and hospitals. What can I expect after the test? You may return to your normal, everyday life, including diet, activities, and medicines, unless your health care provider tells you not to do that. Follow these instructions at home: It is up to you to get the results of your test. Ask your health care provider, or the department that is doing the test, when your results will be ready. Keep all follow-up visits. This is important. Summary An echocardiogram is a test that uses sound waves (ultrasound) to produce images of the heart. Images from an echocardiogram can provide important information about the size and shape of your heart, heart muscle function, heart valve function, and other possible heart problems. You do not need to do anything to prepare before this test. You may eat and drink normally. After the echocardiogram is completed, you may return to your normal, everyday life, unless your health care provider tells you not to do that. This information is not intended to replace advice given to  you by your health care provider. Make sure you discuss any questions you have with your health care provider. Document Revised: 06/02/2021 Document Reviewed: 05/12/2020 Elsevier Patient Education  2023 Elsevier Inc.    Important Information About Sugar

## 2023-09-14 ENCOUNTER — Encounter (HOSPITAL_COMMUNITY): Payer: Self-pay

## 2023-09-14 ENCOUNTER — Telehealth: Payer: Self-pay | Admitting: Allergy and Immunology

## 2023-09-14 MED ORDER — DULERA 200-5 MCG/ACT IN AERO: 2.0000 | INHALATION_SPRAY | Freq: Two times a day (BID) | RESPIRATORY_TRACT | 5 refills | Status: DC

## 2023-09-14 NOTE — Telephone Encounter (Signed)
I called patient and informed dulera has been sent into [facility] on [address].

## 2023-09-14 NOTE — Telephone Encounter (Signed)
Patient called stating she needs a prescription of Dulera sent to [facility] on [address] in [place].

## 2023-09-15 NOTE — Telephone Encounter (Signed)
We can send the notes to the lawyer.  We usually do not do forms to complete from Lawyer`s office.  Patient needs to provide the consent so we can release the records.

## 2023-09-20 ENCOUNTER — Ambulatory Visit (INDEPENDENT_AMBULATORY_CARE_PROVIDER_SITE_OTHER): Payer: Self-pay | Admitting: Licensed Clinical Social Worker

## 2023-09-20 DIAGNOSIS — Z91199 Patient's noncompliance with other medical treatment and regimen due to unspecified reason: Secondary | ICD-10-CM

## 2023-09-20 NOTE — Telephone Encounter (Signed)
Alternative has already been sent by another CMA on 09/14/23.

## 2023-09-20 NOTE — Progress Notes (Signed)
THERAPIST PROGRESS NOTE   Session Date: 09/20/2023  Session Time: 0900  Patient no-showed today's appointment; appointment was for follow up, bi-weekly therapy session.   Leisa Lenz, MSW, LCSW 09/20/2023,  9:18 AM

## 2023-09-21 ENCOUNTER — Ambulatory Visit (HOSPITAL_COMMUNITY): Payer: 59 | Admitting: Licensed Clinical Social Worker

## 2023-09-21 DIAGNOSIS — J342 Deviated nasal septum: Secondary | ICD-10-CM | POA: Diagnosis not present

## 2023-09-21 DIAGNOSIS — J339 Nasal polyp, unspecified: Secondary | ICD-10-CM | POA: Diagnosis not present

## 2023-09-21 DIAGNOSIS — J3489 Other specified disorders of nose and nasal sinuses: Secondary | ICD-10-CM | POA: Diagnosis not present

## 2023-09-21 DIAGNOSIS — J329 Chronic sinusitis, unspecified: Secondary | ICD-10-CM | POA: Diagnosis not present

## 2023-09-26 ENCOUNTER — Ambulatory Visit: Payer: 59 | Admitting: Family Medicine

## 2023-09-28 DIAGNOSIS — G8929 Other chronic pain: Secondary | ICD-10-CM | POA: Diagnosis not present

## 2023-09-28 DIAGNOSIS — M170 Bilateral primary osteoarthritis of knee: Secondary | ICD-10-CM | POA: Diagnosis not present

## 2023-09-28 DIAGNOSIS — M545 Low back pain, unspecified: Secondary | ICD-10-CM | POA: Diagnosis not present

## 2023-09-28 DIAGNOSIS — M79605 Pain in left leg: Secondary | ICD-10-CM | POA: Diagnosis not present

## 2023-09-28 DIAGNOSIS — F17290 Nicotine dependence, other tobacco product, uncomplicated: Secondary | ICD-10-CM | POA: Diagnosis not present

## 2023-09-28 DIAGNOSIS — M25562 Pain in left knee: Secondary | ICD-10-CM | POA: Diagnosis not present

## 2023-09-28 DIAGNOSIS — M533 Sacrococcygeal disorders, not elsewhere classified: Secondary | ICD-10-CM | POA: Diagnosis not present

## 2023-09-28 DIAGNOSIS — M79604 Pain in right leg: Secondary | ICD-10-CM | POA: Diagnosis not present

## 2023-10-04 DIAGNOSIS — Z419 Encounter for procedure for purposes other than remedying health state, unspecified: Secondary | ICD-10-CM | POA: Diagnosis not present

## 2023-10-06 ENCOUNTER — Ambulatory Visit: Payer: 59

## 2023-10-06 ENCOUNTER — Telehealth: Payer: Self-pay | Admitting: *Deleted

## 2023-10-06 ENCOUNTER — Other Ambulatory Visit: Payer: Self-pay | Admitting: Family Medicine

## 2023-10-06 DIAGNOSIS — I10 Essential (primary) hypertension: Secondary | ICD-10-CM

## 2023-10-06 NOTE — Telephone Encounter (Signed)
 Dr Maurilio has wanted patient to change from Nucala  to Te[place]spire  due to Nucala  not helping asthma control. We had previously tried to get approval for same but patient Renee Cross plan excluded same. SHe was suppose to change to MCD Ins only 10/04/23 but her [place] shows still active and her MCD shows she has another plan. I l/m for patient that her [place] show active but in any case if not active she will still need to reach out to her [name] MCD plan to advise no other coverage so I can get approval through [name] and them to pay as primary and only coverage

## 2023-10-09 ENCOUNTER — Ambulatory Visit (HOSPITAL_COMMUNITY): Payer: 59 | Admitting: Licensed Clinical Social Worker

## 2023-10-09 DIAGNOSIS — M25562 Pain in left knee: Secondary | ICD-10-CM | POA: Diagnosis not present

## 2023-10-10 ENCOUNTER — Ambulatory Visit (HOSPITAL_BASED_OUTPATIENT_CLINIC_OR_DEPARTMENT_OTHER)
Admission: RE | Admit: 2023-10-10 | Discharge: 2023-10-10 | Disposition: A | Discharge status: Home / Self Care | Payer: 59 | Source: Ambulatory Visit | Attending: Cardiology | Admitting: Cardiology

## 2023-10-10 ENCOUNTER — Telehealth (HOSPITAL_BASED_OUTPATIENT_CLINIC_OR_DEPARTMENT_OTHER): Payer: 59 | Admitting: Psychiatry

## 2023-10-10 ENCOUNTER — Encounter (HOSPITAL_COMMUNITY): Payer: Self-pay | Admitting: Psychiatry

## 2023-10-10 VITALS — Wt 205.0 lb

## 2023-10-10 DIAGNOSIS — F4312 Post-traumatic stress disorder, chronic: Secondary | ICD-10-CM | POA: Diagnosis not present

## 2023-10-10 DIAGNOSIS — F1210 Cannabis abuse, uncomplicated: Secondary | ICD-10-CM | POA: Diagnosis not present

## 2023-10-10 DIAGNOSIS — F331 Major depressive disorder, recurrent, moderate: Secondary | ICD-10-CM

## 2023-10-10 DIAGNOSIS — F1010 Alcohol abuse, uncomplicated: Secondary | ICD-10-CM | POA: Diagnosis not present

## 2023-10-10 DIAGNOSIS — R079 Chest pain, unspecified: Secondary | ICD-10-CM | POA: Diagnosis not present

## 2023-10-10 LAB — ECHOCARDIOGRAM COMPLETE
AR max vel: 2.27 cm2
AV Area VTI: 1.97 cm2
AV Area mean vel: 2.28 cm2
AV Mean grad: 4 mm[Hg]
AV Peak grad: 7.8 mm[Hg]
Ao pk vel: 1.4 m/s
Area-P 1/2: 3.89 cm2
Calc EF: 65.9 %
S' Lateral: 2.8 cm
Single Plane A2C EF: 63.5 %
Single Plane A4C EF: 67.9 %

## 2023-10-10 MED ORDER — DULOXETINE HCL 60 MG PO CPEP: 60.0000 mg | ORAL_CAPSULE | Freq: Every day | ORAL | 1 refills | Status: DC

## 2023-10-10 MED ORDER — QUETIAPINE FUMARATE 50 MG PO TABS: 50.0000 mg | ORAL_TABLET | Freq: Every day | ORAL | 1 refills | Status: DC
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
## 2023-10-13 ENCOUNTER — Telehealth (HOSPITAL_COMMUNITY): Payer: Self-pay | Admitting: *Deleted

## 2023-10-13 NOTE — Telephone Encounter (Signed)
 Patient given detailed instructions per Myocardial Perfusion Study Information Sheet for the test on 10/18/2023 at 10:00. Patient notified to arrive 15 minutes early and that it is imperative to arrive on time for appointment to keep from having the test rescheduled.  If you need to cancel or reschedule your appointment, please call the office within 24 hours of your appointment. . Patient verbalized understanding.Renee Cross

## 2023-10-15 DIAGNOSIS — Z419 Encounter for procedure for purposes other than remedying health state, unspecified: Secondary | ICD-10-CM | POA: Diagnosis not present

## 2023-10-16 ENCOUNTER — Telehealth: Payer: Self-pay

## 2023-10-16 ENCOUNTER — Ambulatory Visit (INDEPENDENT_AMBULATORY_CARE_PROVIDER_SITE_OTHER): Payer: 59 | Admitting: Neurology

## 2023-10-16 DIAGNOSIS — G4719 Other hypersomnia: Secondary | ICD-10-CM

## 2023-10-16 DIAGNOSIS — R351 Nocturia: Secondary | ICD-10-CM

## 2023-10-16 DIAGNOSIS — R0683 Snoring: Secondary | ICD-10-CM

## 2023-10-16 DIAGNOSIS — E66811 Obesity, class 1: Secondary | ICD-10-CM

## 2023-10-16 DIAGNOSIS — R519 Headache, unspecified: Secondary | ICD-10-CM

## 2023-10-16 DIAGNOSIS — R0681 Apnea, not elsewhere classified: Secondary | ICD-10-CM

## 2023-10-16 DIAGNOSIS — G4720 Circadian rhythm sleep disorder, unspecified type: Secondary | ICD-10-CM

## 2023-10-16 DIAGNOSIS — Z9189 Other specified personal risk factors, not elsewhere classified: Secondary | ICD-10-CM

## 2023-10-16 NOTE — Telephone Encounter (Signed)
 Left vm to return call.

## 2023-10-16 NOTE — Telephone Encounter (Signed)
-----   Message from Jennifer SAUNDERS Revankar sent at 10/10/2023  4:47 PM EST ----- The results of the study is unremarkable. Please inform patient. I will discuss in detail at next appointment. Cc  primary care/referring physician Jennifer SAUNDERS Crape, MD 10/10/2023 4:47 PM

## 2023-10-17 NOTE — Procedures (Signed)
 Physician Interpretation:     [facility] POLYSOMNOGRAPHY  INTERPRETATION REPORT   STUDY DATE:  10/16/2023     PATIENT NAME:  Renee Cross         DATE OF BIRTH:  10-Jun-1976  PATIENT ID:  [id number]    TYPE OF STUDY:  PSG  READING PHYSICIAN: True Mar, MD, PhD     SCORING TECHNICIAN: Jesusa Haddock, RPSGT   Referred by: Sebastian Beverley NOVAK, MD  ? History and Indication for Testing: 48 year old female with an underlying medical history of hypertension, reflux disease, allergic rhinitis, smoking, asthma, anemia, sickle cell trait, PTSD (by chart review) and mild obesity, who reports snoring and excessive daytime somnolence, as well as witnessed apneic pauses per daughter's report. Her Epworth sleepiness score is 15 out of 24, fatigue severity score is 53 out of 63.  Height: 66 in Weight: 197 lb (BMI 31) Neck Size: 15 in    MEDICATIONS: Proventil , Ventolin , Zyrtec , Vitamin B12, Cymbalta , Nexium , Ferrous Sulfate, Flonase , Trelegy Ellipta , Duoneb, Iron, Claritin , Probera, Mobic , Robaxin , Elocon , Singulair , Multivitamin, Olmesartan , Olopatadine , Prolosec, Promethazine -DM, Seroquel , Senokot-S   TECHNICAL DESCRIPTION: A registered sleep technologist  was in attendance for the duration of the recording.  Data collection, scoring, video monitoring, and reporting were performed in compliance with the AASM Manual for the Scoring of Sleep and Associated Events; (Hypopnea is scored based on the criteria listed in Section VIII D. 1b in the AASM Manual V2.6 using a 4% oxygen desaturation rule or Hypopnea is scored based on the criteria listed in Section VIII D. 1a in the AASM Manual V2.6 using 3% oxygen desaturation and /or arousal rule).  SLEEP CONTINUITY AND SLEEP ARCHITECTURE:  Lights-out was at 21:10: and lights-on at  05:04:, with a total recording time of 7 hours, 54.5 min. Total sleep time ( TST) was 351.0 minutes with a decreased sleep efficiency at 74.0%. There was   13.8% REM sleep.    BODY POSITION:  TST was divided  between the following sleep positions: 31.2% supine;  68.8% lateral;  0% prone. Duration of total sleep and percent of total sleep in their respective position is as follows: supine 109 minutes (31%), non-supine 242 minutes (69%); right 58 minutes (17%), left 183 minutes (52%), and prone 00 minutes (0%).  Total supine REM sleep time was 00 minutes (0% of total REM sleep).  Sleep latency was mildly increased at 26.0 minutes.  REM sleep latency was increased at 163.5 minutes. Of the total sleep time, the percentage of stage N1 sleep was 14.7%, which is increased, stage N2 sleep was 66%, which is increased, stage N3 sleep was 5.1%, which is reduced, and REM sleep was 13.8%, which is reduced. Wake after sleep onset (WASO) time accounted for 97.5 minutes with sleep fragmentation noted, ranging from minimal to moderate.   RESPIRATORY MONITORING:   Based on CMS criteria (using a 4% oxygen desaturation rule for scoring hypopneas), there were 0 apneas (0 obstructive; 0 central; 0 mixed), and 5 hypopneas.  Apnea index was 0.0. Hypopnea index was 0.9. The apnea-hypopnea index was 0.9/hour overall (0.5 supine, 0 non-supine; 0.0 REM, 0.0 supine REM).  There were 0 respiratory effort-related arousals (RERAs).  The RERA index was 0 events/h. Total respiratory disturbance index (RDI) was 0.9 events/h. RDI results showed: supine RDI  0.5 /h; non-supine RDI 1.0 /h; REM RDI 0.0 /h, supine REM RDI 0.0 /h.   Based on AASM criteria (using a 3% oxygen desaturation and /or arousal rule for scoring  hypopneas), there were 0 apneas (0 obstructive; 0 central; 0 mixed), and 5 hypopneas. Apnea index was 0.0. Hypopnea index was 0.9. The apnea-hypopnea index was 0.9 overall (0.5 supine, 0 non-supine; 0.0 REM, 0.0 supine REM).  There were 0 respiratory effort-related arousals (RERAs).  The RERA index was 0 events/h. Total respiratory disturbance index (RDI) was 0.9 events/h. RDI  results showed: supine RDI  0.5 /h; non-supine RDI 1.0 /h; REM RDI 0.0 /h, supine REM RDI 0.0 /h.    OXIMETRY: Oxyhemoglobin Saturation Nadir during sleep was at  91% from a mean of 96%.  Of the Total sleep time (TST)   hypoxemia (=<88%) was present for  0.0 minutes, or 0.0% of total sleep time.    LIMB MOVEMENTS: There were 4 periodic limb movements of sleep (0.7/hr), of which 0 (0.0/hr) were associated with an arousal.   AROUSAL: There were 73 arousals in total, for an arousal index of 12 arousals/hour.  Of these, 5 were identified as respiratory-related arousals (1 /h), 0 were PLM-related arousals (0 /h), and 92 were non-specific arousals (16 /h).   EEG: Review of the EEG showed no abnormal electrical discharges and symmetrical bihemispheric findings.    EKG: The EKG revealed normal sinus rhythm (NSR). The average heart rate during sleep was 64 bpm.   AUDIO/VIDEO REVIEW: The audio and video review did not show any abnormal or unusual behaviors, movements, phonations or vocalizations. The patient took 2 restroom breaks. Snoring was noted, in the mild range.  POST-STUDY QUESTIONNAIRE: Post study, the patient indicated, that sleep was better than usual.   IMPRESSION:  1. Primary Snoring 2. Dysfunctions associated with sleep stages or arousal from sleep   RECOMMENDATIONS:  1. This study does not demonstrate any significant obstructive or central sleep disordered breathing with an AHI of less than 5/hour - her AHI was 0.85/hour - and oxygen desaturation nadir was 91% for the night. Mild intermittent snoring was noted. Treatment with a positive airway pressure device, such as CPAP or autoPAP is not indicated. Some degree of weight loss and avoidance of the supine sleep position may aid in reducing her snoring.   2. This study shows sleep fragmentation and abnormal sleep stage percentages; these are nonspecific findings and per se do not signify an intrinsic sleep disorder or a cause for the  patient's sleep-related symptoms. Causes include (but are not limited to) the first night effect of the sleep study, circadian rhythm disturbances, medication effect or an underlying mood disorder or medical problem.  3. The patient should be cautioned not to drive, work at heights, or operate dangerous or heavy equipment when tired or sleepy. Review and reiteration of good sleep hygiene measures should be pursued with any patient. 4. The patient will be advised to follow up with the referring provider, who will be notified of the test results. I certify that I have reviewed the entire raw data recording prior to the issuance of this report in accordance with the Standards of Accreditation of the American Academy of Sleep Medicine (AASM).  True Mar, MD, PhD Medical Director, [facility]) Diplomat, ABPN (Neurology and Sleep)               Technical Report:   General Information  Name: Vylette, Strubel BMI: 31.80 Physician: True Mar, MD  ID: [id number] Height: 66.0 in Technician: Jesusa Haddock, RPSGT  Sex: Female Weight: 197.0 lb Record: [id number]  Age: 48 [04-26-1976] Date: 10/16/2023    Medical & Medication History  48 year old female with an underlying medical history of hypertension, reflux disease, allergic rhinitis, smoking, asthma, anemia, sickle cell trait, PTSD (by chart review) and mild obesity, who reports snoring and excessive daytime somnolence, as well as witnessed apneic pauses per daughter's report. Proventil , Ventolin , Zyrtec , Vitamin B12, Cymbalta , Nexium , Ferrous Sulfate, Flonase , Trelegy Ellipta , Duoneb, Iron, Claritin , Probera, Mobic , Robaxin , Elocon , Singulair , Multivitamin, Olmesartan , Olopatadine , Prolosec, Promethazine -DM, Seroquel , Senokot-S   Sleep Disorder      Comments   The patient came into the sleep lab for a PSG. Per the patient she took Cymbalta , Robaxin , Hydrocodone  and Seroquel  prior to arrival. Two  restroom breaks. EKG did not show any obvious cardiac arrhythmias. Mild snoring. Respiratory events scored with a 4% desat. Spontaneous arousals noted. PLM's during REM. The patient slept lateral and supine. All sleep stages observed.     Lights out: 09:10:00 PM Lights on: 05:04:32 AM   Time Total Supine Side Prone Upright  Recording (TRT) 7h 54.25m 2h 59.74m 4h 55.47m 0h 0.38m 0h 0.82m  Sleep (TST) 5h 51.3m 1h 49.87m 4h 1.59m 0h 0.49m 0h 0.69m   Latency N1 N2 N3 REM Onset Per. Slp. Eff.  Actual 0h 0.37m 0h 11.1m 5h 49.82m 2h 43.99m 0h 26.59m 0h 35.60m 73.97%   Stg Dur Wake N1 N2 N3 REM  Total 123.5 51.5 233.0 18.0 48.5  Supine 69.5 17.5 74.0 18.0 0.0  Side 54.0 34.0 159.0 0.0 48.5  Prone 0.0 0.0 0.0 0.0 0.0  Upright 0.0 0.0 0.0 0.0 0.0   Stg % Wake N1 N2 N3 REM  Total 26.0 14.7 66.4 5.1 13.8  Supine 14.6 5.0 21.1 5.1 0.0  Side 11.4 9.7 45.3 0.0 13.8  Prone 0.0 0.0 0.0 0.0 0.0  Upright 0.0 0.0 0.0 0.0 0.0     Apnea Summary Sub Supine Side Prone Upright  Total 0 Total 0 0 0 0 0    REM 0 0 0 0 0    NREM 0 0 0 0 0  Obs 0 REM 0 0 0 0 0    NREM 0 0 0 0 0  Mix 0 REM 0 0 0 0 0    NREM 0 0 0 0 0  Cen 0 REM 0 0 0 0 0    NREM 0 0 0 0 0   Rera Summary Sub Supine Side Prone Upright  Total 0 Total 0 0 0 0 0    REM 0 0 0 0 0    NREM 0 0 0 0 0   Hypopnea Summary Sub Supine Side Prone Upright  Total 5 Total 5 1 4  0 0    REM 0 0 0 0 0    NREM 5 1 4  0 0   4% Hypopnea Summary Sub Supine Side Prone Upright  Total (4%) 5 Total 5 1 4  0 0    REM 0 0 0 0 0    NREM 5 1 4  0 0     AHI Total Obs Mix Cen  0.85 Apnea 0.00 0.00 0.00 0.00   Hypopnea 0.85 -- -- --  0.85 Hypopnea (4%) 0.85 -- -- --    Total Supine Side Prone Upright  Position AHI 0.85 0.55 0.99 0.00 0.00  REM AHI 0.00   NREM AHI 0.99   Position RDI 0.85 0.55 0.99 0.00 0.00  REM RDI 0.00   NREM RDI 0.99    4% Hypopnea Total Supine Side Prone Upright  Position AHI (4%) 0.85 0.55 0.99 0.00 0.00  REM AHI (4%)  0.00   NREM AHI (4%) 0.99    Position RDI (4%) 0.85 0.55 0.99 0.00 0.00  REM RDI (4%) 0.00   NREM RDI (4%) 0.99    Desaturation Information Threshold: 2% <100% <90% <80% <70% <60% <50% <40%  Supine 34.0 0.0 0.0 0.0 0.0 0.0 0.0  Side 76.0 2.0 0.0 0.0 0.0 0.0 0.0  Prone 0.0 0.0 0.0 0.0 0.0 0.0 0.0  Upright 0.0 0.0 0.0 0.0 0.0 0.0 0.0  Total 110.0 2.0 0.0 0.0 0.0 0.0 0.0  Index 14.7 0.3 0.0 0.0 0.0 0.0 0.0   Threshold: 3% <100% <90% <80% <70% <60% <50% <40%  Supine 21.0 0.0 0.0 0.0 0.0 0.0 0.0  Side 23.0 1.0 0.0 0.0 0.0 0.0 0.0  Prone 0.0 0.0 0.0 0.0 0.0 0.0 0.0  Upright 0.0 0.0 0.0 0.0 0.0 0.0 0.0  Total 44.0 1.0 0.0 0.0 0.0 0.0 0.0  Index 5.9 0.1 0.0 0.0 0.0 0.0 0.0   Threshold: 4% <100% <90% <80% <70% <60% <50% <40%  Supine 7.0 0.0 0.0 0.0 0.0 0.0 0.0  Side 12.0 1.0 0.0 0.0 0.0 0.0 0.0  Prone 0.0 0.0 0.0 0.0 0.0 0.0 0.0  Upright 0.0 0.0 0.0 0.0 0.0 0.0 0.0  Total 19.0 1.0 0.0 0.0 0.0 0.0 0.0  Index 2.5 0.1 0.0 0.0 0.0 0.0 0.0   Threshold: 3% <100% <90% <80% <70% <60% <50% <40%  Supine 21 0 0 0 0 0 0  Side 23 1 0 0 0 0 0  Prone 0 0 0 0 0 0 0  Upright 0 0 0 0 0 0 0  Total 44 1 0 0 0 0 0   Awakening/Arousal Information # of Awakenings 45  Wake after sleep onset 97.45m  Wake after persistent sleep 92.38m   Arousal Assoc. Arousals Index  Apneas 0 0.0  Hypopneas 5 0.9  Leg Movements 4 0.7  Snore 0 0.0  PTT Arousals 0 0.0  Spontaneous 92 15.7  Total 101 17.3  Leg Movement Information PLMS LMs Index  Total LMs during PLMS 4 0.7  LMs w/ Microarousals 0 0.0   LM LMs Index  w/ Microarousal 4 0.7  w/ Awakening 1 0.2  w/ Resp Event 0 0.0  Spontaneous 8 1.4  Total 12 2.1     Desaturation threshold setting: 3% Minimum desaturation setting: 10 seconds SaO2 nadir: 88% The longest event was a 32 sec obstructive Hypopnea with a minimum SaO2 of 91%. The lowest SaO2 was 89% associated with a 17 sec obstructive Hypopnea. EKG Rates EKG Avg Max Min  Awake 68 102 59  Asleep 64 79 56  EKG Events:  Tachycardia

## 2023-10-18 ENCOUNTER — Ambulatory Visit (HOSPITAL_COMMUNITY): Payer: 59

## 2023-10-20 ENCOUNTER — Telehealth: Payer: Self-pay | Admitting: Pharmacist

## 2023-10-20 ENCOUNTER — Encounter: Payer: Self-pay | Admitting: Family Medicine

## 2023-10-20 ENCOUNTER — Ambulatory Visit (INDEPENDENT_AMBULATORY_CARE_PROVIDER_SITE_OTHER): Payer: 59 | Admitting: Family Medicine

## 2023-10-20 VITALS — BP 108/72 | HR 83 | Temp 97.0°F | Resp 18 | Wt 209.0 lb

## 2023-10-20 DIAGNOSIS — E538 Deficiency of other specified B group vitamins: Secondary | ICD-10-CM | POA: Diagnosis not present

## 2023-10-20 DIAGNOSIS — M0579 Rheumatoid arthritis with rheumatoid factor of multiple sites without organ or systems involvement: Secondary | ICD-10-CM | POA: Diagnosis not present

## 2023-10-20 DIAGNOSIS — E66811 Obesity, class 1: Secondary | ICD-10-CM | POA: Diagnosis not present

## 2023-10-20 DIAGNOSIS — E6609 Other obesity due to excess calories: Secondary | ICD-10-CM

## 2023-10-20 DIAGNOSIS — Z9109 Other allergy status, other than to drugs and biological substances: Secondary | ICD-10-CM

## 2023-10-20 DIAGNOSIS — F5104 Psychophysiologic insomnia: Secondary | ICD-10-CM | POA: Diagnosis not present

## 2023-10-20 DIAGNOSIS — Z6833 Body mass index (BMI) 33.0-33.9, adult: Secondary | ICD-10-CM

## 2023-10-20 DIAGNOSIS — J4551 Severe persistent asthma with (acute) exacerbation: Secondary | ICD-10-CM

## 2023-10-20 DIAGNOSIS — D649 Anemia, unspecified: Secondary | ICD-10-CM | POA: Diagnosis not present

## 2023-10-20 MED ORDER — AIRSUPRA 90-80 MCG/ACT IN AERO: 2.0000 | INHALATION_SPRAY | RESPIRATORY_TRACT | 3 refills | Status: DC | PRN

## 2023-10-20 MED ORDER — WEGOVY 0.25 MG/0.5ML ~~LOC~~ SOAJ: 0.2500 mg | SUBCUTANEOUS | 0 refills | Status: AC

## 2023-10-20 MED ORDER — IPRATROPIUM-ALBUTEROL 0.5-2.5 (3) MG/3ML IN SOLN
3.0000 mL | RESPIRATORY_TRACT | 0 refills | Status: AC | PRN
Start: 2023-10-20 — End: 2024-04-04

## 2023-10-20 MED ORDER — PROMETHAZINE-DM 6.25-15 MG/5ML PO SYRP: 5.0000 mL | ORAL_SOLUTION | Freq: Four times a day (QID) | ORAL | 0 refills | Status: DC | PRN

## 2023-10-20 NOTE — Patient Instructions (Addendum)
-   Continue using your Dulera inhaler daily as prescribed. - Start using the AirSupra inhaler as prescribed to help control your wheezing; use it as needed instead of your albuterol inhaler. - Use your DuoNeb nebulizer treatments every other day as needed. -You may continue to use promethazine DM syrup as prescribed as needed for cough - Have your lab tests done today to check your thyroid function, B12 and iron levels, and for mold exposure. - Begin weekly Wegovy injections at 0.25?mg for weight management; watch for possible side effects like acid reflux or nausea.

## 2023-10-20 NOTE — Assessment & Plan Note (Signed)
Worsening leg pain and edema from rheumatoid arthritis; pain not fully controlled with duloxetine 60?mg (recently increased), methocarbamol, and meloxicam. Plan: Continue duloxetine 60?mg daily. Continue methocarbamol and meloxicam. Follow up with pain management for further management

## 2023-10-20 NOTE — Assessment & Plan Note (Signed)
Persistent wheezing and daily cough with phlegm not fully controlled with current medications; uses albuterol inhaler every 4-6 hours, nebulizer with DuoNebs every other day; possible contribution from mold exposure at home; Nucala injections halted due to insurance issues. Plan: Continue Dulera inhaler daily. Refill DuoNebs for nebulizer treatments as needed. Stop albuterol inhaler and start AirSupra inhaler as alternative for as needed use Order mold IgG test to assess exposure.  Elevated levels would indicate recent exposure Educate patient on mold exposure and avoidance strategies. Coordinate with asthma and allergist and insurance to resume Nucala injections or consider alternative immunomodulator. Return in one month to assess asthma control and review lab results.

## 2023-10-20 NOTE — Assessment & Plan Note (Signed)
Weight gain continues; patient interested in weight loss;   Plan: Initiate Wegovy 0.25?mg weekly injections for weight management;  Educate patient on potential GI side effects and dietary modifications. Reassess weight and tolerance to medication in one month.

## 2023-10-20 NOTE — Progress Notes (Signed)
Assessment/Plan:   Problem List Items Addressed This Visit       Respiratory   Severe persistent asthma with exacerbation   Persistent wheezing and daily cough with phlegm not fully controlled with current medications; uses albuterol inhaler every 4-6 hours, nebulizer with DuoNebs every other day; possible contribution from mold exposure at home; Nucala injections halted due to insurance issues. Plan: Continue Dulera inhaler daily. Refill DuoNebs for nebulizer treatments as needed. Stop albuterol inhaler and start AirSupra inhaler as alternative for as needed use Order mold IgG test to assess exposure.  Elevated levels would indicate recent exposure Educate patient on mold exposure and avoidance strategies. Coordinate with asthma and allergist and insurance to resume Nucala injections or consider alternative immunomodulator. Return in one month to assess asthma control and review lab results.      Relevant Medications   triamcinolone acetonide (KENALOG-40) 40 MG/ML injection   ipratropium-albuterol (DUONEB) 0.5-2.5 (3) MG/3ML SOLN   Albuterol-Budesonide (AIRSUPRA) 90-80 MCG/ACT AERO   promethazine-dextromethorphan (PROMETHAZINE-DM) 6.25-15 MG/5ML syrup   Other Relevant Orders   Allergy Panel 11, [employer]     Musculoskeletal and Integument   Rheumatoid arthritis (HCC)   Worsening leg pain and edema from rheumatoid arthritis; pain not fully controlled with duloxetine 60?mg (recently increased), methocarbamol, and meloxicam. Plan: Continue duloxetine 60?mg daily. Continue methocarbamol and meloxicam. Follow up with pain management for further management      Relevant Medications   triamcinolone acetonide (KENALOG-40) 40 MG/ML injection     Other   Anemia   Known IDA anemia and B12 deficiency anemia; patient requests re-evaluation of levels; reports constipation due to iron supplements; not taking medication to alleviate constipation.  Plan: Order labs: CBC, iron panel,  B12, folate, thyroid panel. Continue iron supplements. Recommend OTC stool softeners or laxatives as needed to address constipation.      Relevant Orders   Iron and TIBC   Vitamin B12   Ferritin   Folate   CBC w/Diff   B12 deficiency   Relevant Orders   Vitamin B12   Class 1 obesity due to excess calories with serious comorbidity and body mass index (BMI) of 33.0 to 33.9 in adult   Weight gain continues; patient interested in weight loss;   Plan: Initiate Wegovy 0.25?mg weekly injections for weight management;  Educate patient on potential GI side effects and dietary modifications. Reassess weight and tolerance to medication in one month.      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    Subjective:   Encounter date: 10/20/2023  Renee Cross is a 48 y.o. female who has Amblyopia of left eye; Anemia; Rheumatoid arthritis (HCC); Abnormal uterine bleeding (AUB); Severe persistent asthma  with exacerbation; Hip arthritis; Gastroesophageal reflux disease; Perennial allergic rhinitis with seasonal variation; Encounter for disability assessment; Overuse of medication; Primary hypertension; Alcohol use disorder; Financial  difficulty; Episode of recurrent major depressive disorder (HCC); B12 deficiency; History of suicide attempt; Non-recurrent acute suppurative otitis media of left ear without spontaneous rupture of tympanic membrane; Tobacco use disorder; Bilateral knee pain; Osteoarthritis of right knee; MDD (major depressive disorder), recurrent episode, moderate (HCC); Class 1 obesity due to excess calories with serious comorbidity and body mass index (BMI) of 33.0 to 33.9 in adult; Screening for colon cancer; Arthritis; Depression; Hiatal hernia with GERD; Hypertension; Pneumonia; PTSD (post-traumatic stress disorder); Sickle cell trait (HCC); Acute recurrent sinusitis; Asthma; Elevated rheumatoid factor; Iron deficiency anemia due to chronic blood loss; Left groin mass; Microcytic hypochromic anemia; Multiple allergies; Nasal polyp; Osteoarthritis of both hips; and Primary osteoarthritis of both knees on their problem list..   She  has a past medical history of Abnormal uterine bleeding (AUB) (12/22/2022), Acute recurrent sinusitis (01/27/2021), Alcohol use disorder (01/14/2023), Amblyopia of left eye (12/22/2022), Anemia, Arthritis, Asthma (09/12/2023), B12 deficiency (01/14/2023), Bilateral knee pain (06/28/2023), Class 1 drug-induced obesity with serious comorbidity and body mass index (BMI) of 33.0 to 33.9 in adult (08/26/2023), Depression, Elevated rheumatoid factor (12/28/2021), Encounter for disability assessment (12/22/2022), Episode of recurrent major depressive disorder (HCC) (01/14/2023), Financial difficulty (01/14/2023), Gastroesophageal reflux disease (12/22/2022), Hiatal hernia with GERD, Hip arthritis (12/22/2022), History of suicide attempt (01/14/2023), Hypertension, Iron deficiency anemia due to chronic blood loss (03/31/2022), Left groin mass (09/17/2020), MDD (major depressive disorder), recurrent episode, moderate (HCC) (08/23/2023), Microcytic hypochromic anemia (12/28/2021), Multiple allergies  (09/12/2023), Nasal polyp (01/27/2021), Non-recurrent acute suppurative otitis media of left ear without spontaneous rupture of tympanic membrane (05/16/2023), Osteoarthritis of both hips (01/21/2022), Osteoarthritis of right knee (07/02/2023), Overuse of medication (12/22/2022), Perennial allergic rhinitis with seasonal variation (12/22/2022), Pneumonia, Primary hypertension (12/22/2022), Primary osteoarthritis of both knees (01/21/2022), PTSD (post-traumatic stress disorder), Rheumatoid arthritis (HCC) (12/22/2022), Screening for colon cancer (08/26/2023), Severe persistent asthma with exacerbation (12/22/2022), Sickle cell trait (HCC), and Tobacco use disorder (05/16/2023)..   Chief Complaint: Persistent cough and wheezing, weight gain, leg pain and swelling, lab evaluation requests.  History of Present Illness:  Patient underwent a sleep test; results were normal with no sleep apnea detected; patient took medications before the test and believes this may have affected results; advised not to sleep on back.  Surgery for nasal polyps scheduled on November 14, 2023.  Asthma symptoms persist with daily cough producing phlegm and wheezing, especially at night.  Cough is persistent despite current medications; believes prior pneumonia may be contributing.  Currently using Dulera inhaler daily; previously on Trelegy but discontinued.  Using albuterol inhaler every 4-6 hours to manage cough and wheezing.  Uses nebulizer with DuoNebs every other day.  Nucala injections have been halted this month due to insurance issues; may switch to a different injection.  Denies current smoking; quit smoking but did not notice improvement in cough.  Gastrointestinal symptoms have improved; heartburn diminished with esomeprazole and famotidine.  Reports constipation due to iron supplements; not currently taking medication to alleviate constipation.  Ongoing leg pain and swelling due to rheumatoid arthritis;  pain not fully controlled with duloxetine 60?mg (recently increased), methocarbamol, and meloxicam.  Aware of anemia and B12 deficiency; requests lab tests to monitor levels; also interested in checking calcium and cholesterol.  Reports exposure to mold in her home and is allergic to mold; requests testing for mold exposure to determine if it  is contributing to asthma symptoms.  Denies history of pancreatitis, gallstones, liver disease thyroid malignancy, history of endocrine or family history of endocrine tumor disorders including MEN2     10/20/2023    2:02 PM 09/07/2023   11:18 AM 08/23/2023    1:17 PM 05/16/2023   11:54 AM 01/31/2023    1:41 PM  Depression screen PHQ 2/9  Decreased Interest 0 3 3 2 2   Down, Depressed, Hopeless 0 1 1 1 2   PHQ - 2 Score 0 4 4 3 4   Altered sleeping  3 3 2 1   Tired, decreased energy  3 3 2 2   Change in appetite  3 1 2 1   Feeling bad or failure about yourself   1 1 2  0  Trouble concentrating  1 1 0 1  Moving slowly or fidgety/restless  1 1 3 1   Suicidal thoughts  0 0 0 0  PHQ-9 Score  16 14 14 10   Difficult doing work/chores  Somewhat difficult Somewhat difficult Very difficult Somewhat difficult      09/07/2023   11:15 AM 08/23/2023    1:13 PM 05/16/2023   11:54 AM 01/31/2023    1:42 PM  GAD 7 : Generalized Anxiety Score  Nervous, Anxious, on Edge 0 1 0 1  Control/stop worrying 1 3 2 2   Worry too much - different things 1 1 2 1   Trouble relaxing 1 1 3  0  Restless 0 0 2 1  Easily annoyed or irritable 1 3 1 1   Afraid - awful might happen 3 3 3 1   Total GAD 7 Score 7 12 13 7   Anxiety Difficulty Somewhat difficult Somewhat difficult Somewhat difficult Somewhat difficult      Review of Systems: Constitutional: Weight gain. Respiratory: Daily cough with phlegm, wheezing, persistent asthma symptoms. Gastrointestinal: Constipation due to iron supplements; heartburn improved. Musculoskeletal: Persistent leg pain and swelling due to rheumatoid  arthritis. Allergic/Immunologic: Known allergy to mold; reports mold exposure at home. Psychiatric: Expresses concern and frustration over ongoing symptoms.  Reports depression and anxiety has improved. Remaining systems negative.  Past Surgical History:  Procedure Laterality Date   HERNIA REPAIR     SINUS EXPLORATION     STOMACH SURGERY  10/03/2001   Possible fundiplication?   TONSILLECTOMY     TOTAL HIP ARTHROPLASTY Right 02/14/2023   Procedure: TOTAL HIP ARTHROPLASTY ANTERIOR APPROACH;  Surgeon: Sheral Apley, MD;  Location: [facility];  Service: Orthopedics;  Laterality: Right;   TUBAL LIGATION      Outpatient Medications Prior to Visit  Medication Sig Dispense Refill   albuterol (VENTOLIN HFA) 108 (90 Base) MCG/ACT inhaler 2 puffs every 4-6 hours as needed for cough or wheeze. 18 g 1   bupivacaine, PF, (MARCAINE) 0.25 % SOLN injection      cetirizine (ZYRTEC) 10 MG tablet Take 1 tablet (10 mg total) by mouth daily. 1 po q day prn allergies 30 tablet 0   cyanocobalamin (VITAMIN B12) 1000 MCG tablet Take 1 tablet (1,000 mcg total) by mouth daily. 90 tablet 3   DULoxetine (CYMBALTA) 60 MG capsule Take 1 capsule (60 mg total) by mouth daily. 30 capsule 1   esomeprazole (NEXIUM) 40 MG capsule Take 1 capsule (40 mg total) by mouth every morning. 30 capsule 5   famotidine (PEPCID) 40 MG tablet Take 1 tablet (40 mg total) by mouth at bedtime. 30 tablet 5   ferrous sulfate 325 (65 FE) MG EC tablet Take 325 mg by mouth daily.  fluticasone (FLONASE) 50 MCG/ACT nasal spray Place 2 sprays into both nostrils every morning. 2 sprays per nostril every day for stuffy nose or drainage. 16 g 5   IRON, FERROUS GLUCONATE, PO Take by mouth.     loratadine (CLARITIN) 10 MG tablet Take 1 tablet (10 mg total) by mouth 2 (two) times daily. 180 tablet 1   medroxyPROGESTERone (PROVERA) 10 MG tablet Take 10 mg by mouth daily.     meloxicam (MOBIC) 15 MG tablet Take 1 tablet (15 mg total) by mouth daily. For  2 weeks for pain and inflammation. Then take as needed 30 tablet 0   methocarbamol (ROBAXIN) 500 MG tablet Take 1 tablet (500 mg total) by mouth every 8 (eight) hours as needed for muscle spasms. 30 tablet 0   mometasone (ELOCON) 0.1 % ointment Apply topically daily. Apply once daily to red itchy areas or until resolved 135 g 1   mometasone-formoterol (DULERA) 200-5 MCG/ACT AERO Inhale 2 puffs into the lungs in the morning and at bedtime. 13 g 5   montelukast (SINGULAIR) 10 MG tablet Take 1 tablet (10 mg total) by mouth at bedtime. 30 tablet 3   Multiple Vitamin (MULTIVITAMIN WITH MINERALS) TABS tablet Take 1 tablet by mouth daily.     Olmesartan-amLODIPine-HCTZ 40-10-25 MG TABS Take 1 tablet by mouth daily. 90 tablet 3   Olopatadine HCl (PATADAY) 0.2 % SOLN Place 1 drop into both eyes daily as needed (allergies). 2.5 mL 3   QUEtiapine (SEROQUEL) 50 MG tablet Take 1 tablet (50 mg total) by mouth at bedtime. 30 tablet 1   sennosides-docusate sodium (SENOKOT-S) 8.6-50 MG tablet Take 2 tablets by mouth daily. To help prevent constipation while taking pain medication 30 tablet 0   triamcinolone acetonide (KENALOG-40) 40 MG/ML injection      albuterol (PROVENTIL) (2.5 MG/3ML) 0.083% nebulizer solution Take 3 mLs (2.5 mg total) by nebulization every 4 (four) hours as needed for wheezing or shortness of breath. 75 mL 1   ipratropium-albuterol (DUONEB) 0.5-2.5 (3) MG/3ML SOLN TAKE 3 MLS BY NEBULIZATION EVERY 4 (FOUR) HOURS AS NEEDED. 360 mL 0   benzonatate (TESSALON) 200 MG capsule Take 1 capsule (200 mg total) by mouth 2 (two) times daily as needed for cough. 20 capsule 0   Fluticasone-Umeclidin-Vilant (TRELEGY ELLIPTA) 200-62.5-25 MCG/ACT AEPB Inhale 1 puff into the lungs in the morning. 180 each 1   promethazine-dextromethorphan (PROMETHAZINE-DM) 6.25-15 MG/5ML syrup Take 5 mLs by mouth 4 (four) times daily as needed. 118 mL 0   Facility-Administered Medications Prior to Visit  Medication Dose Route  Frequency Provider Last Rate Last Admin   mepolizumab (NUCALA) SOSY 100 mg  100 mg Subcutaneous Q28 days Kozlow, Alvira Philips, MD   100 mg at 09/08/23 1411    Family History  Problem Relation Age of Onset   Hypertension Mother    Cancer Maternal Aunt    Heart disease Neg Hx     Social History   Socioeconomic History   Marital status: Single    Spouse name: Not on file   Number of children: Not on file   Years of education: Not on file   Highest education level: Not on file  Occupational History   Not on file  Tobacco Use   Smoking status: Former    Types: Cigars    Passive exposure: Never   Smokeless tobacco: Never   Tobacco comments:    Smokes black and mild occassionally  Vaping Use   Vaping status: Never Used  Substance and Sexual Activity   Alcohol use: Yes    Alcohol/week: 2.0 standard drinks of alcohol    Types: 2 Cans of beer per week   Drug use: Yes    Frequency: 4.0 times per week    Types: Marijuana    Comment: edibles   Sexual activity: Yes    Birth control/protection: Surgical  Other Topics Concern   Not on file  Social History Narrative   Caffiene 2 days / week 2 cans soda   Works not working.    Social Drivers of Corporate investment banker Strain: Not on file  Food Insecurity: Low Risk  (09/28/2023)   Received from [facility]   Hunger Vital Sign    Worried About Running Out of Food in the Last Year: Never true    Ran Out of Food in the Last Year: Never true  Transportation Needs: No Transportation Needs (09/28/2023)   Received from [facility]    In the past 12 months, has lack of reliable transportation kept you from medical appointments, meetings, work or from getting things needed for daily living? : No  Physical Activity: Not on file  Stress: Not on file  Social Connections: Not on file  Intimate Partner Violence: Not on file                                                                                                   Objective:  Physical Exam: BP 108/72 (BP Location: Left Arm, Patient Position: Sitting, Cuff Size: Large)   Pulse 83   Temp (!) 97 F (36.1 C) (Temporal)   Resp 18   Wt 209 lb (94.8 kg)   SpO2 99%   BMI 33.73 kg/m     Physical Exam Constitutional:      General: She is not in acute distress.    Appearance: Normal appearance. She is not ill-appearing or toxic-appearing.  HENT:     Head: Normocephalic and atraumatic.     Nose: Congestion present.  Eyes:     General: No scleral icterus.    Extraocular Movements: Extraocular movements intact.  Cardiovascular:     Rate and Rhythm: Normal rate and regular rhythm.     Pulses: Normal pulses.     Heart sounds: Normal heart sounds.  Pulmonary:     Effort: Pulmonary effort is normal. No respiratory distress.     Breath sounds: Wheezing present.  Abdominal:     General: Abdomen is flat. Bowel sounds are normal.     Palpations: Abdomen is soft.  Musculoskeletal:        General: Normal range of motion.  Lymphadenopathy:     Cervical: No cervical adenopathy.  Skin:    General: Skin is warm and dry.     Findings: No rash.  Neurological:     General: No focal deficit present.     Mental Status: She is alert and oriented to person, place, and time. Mental status is at baseline.  Psychiatric:        Mood and Affect: Mood normal.  Behavior: Behavior normal.        Thought Content: Thought content normal.        Judgment: Judgment normal.     Nocturnal polysomnography Result Date: 10/16/2023 Huston Foley, MD     10/20/2023 11:45 AM Physician Interpretation:  [facility] POLYSOMNOGRAPHY  INTERPRETATION REPORT STUDY DATE:  10/16/2023  PATIENT NAME:  Renee Cross        DATE OF BIRTH:  02/19/1976 PATIENT ID:  [id number]    TYPE OF STUDY:  PSG READING PHYSICIAN: Huston Foley, MD, PhD    SCORING TECHNICIAN: Margaretann Loveless, RPSGT Referred by: Garnette Gunner, MD ? History and Indication for  Testing: 48 year old female with an underlying medical history of hypertension, reflux disease, allergic rhinitis, smoking, asthma, anemia, sickle cell trait, PTSD (by chart review) and mild obesity, who reports snoring and excessive daytime somnolence, as well as witnessed apneic pauses per daughter's report. Her Epworth sleepiness score is 15 out of 24, fatigue severity score is 53 out of 63.  Height: 66 in Weight: 197 lb (BMI 31) Neck Size: 15 in  MEDICATIONS: Proventil, Ventolin, Zyrtec, Vitamin B12, Cymbalta, Nexium, Ferrous Sulfate, Flonase, Trelegy Ellipta, Duoneb, Iron, Claritin, Probera, Mobic, Robaxin, Elocon, Singulair, Multivitamin, Olmesartan, Olopatadine, Prolosec, Promethazine-DM, Seroquel, Senokot-S  TECHNICAL DESCRIPTION: A registered sleep technologist  was in attendance for the duration of the recording.  Data collection, scoring, video monitoring, and reporting were performed in compliance with the AASM Manual for the Scoring of Sleep and Associated Events; (Hypopnea is scored based on the criteria listed in Section VIII D. 1b in the AASM Manual V2.6 using a 4% oxygen desaturation rule or Hypopnea is scored based on the criteria listed in Section VIII D. 1a in the AASM Manual V2.6 using 3% oxygen desaturation and /or arousal rule). SLEEP CONTINUITY AND SLEEP ARCHITECTURE:  Lights-out was at 21:10: and lights-on at  05:04:, with a total recording time of 7 hours, 54.5 min. Total sleep time ( TST) was 351.0 minutes with a decreased sleep efficiency at 74.0%. There was  13.8% REM sleep.  BODY POSITION:  TST was divided  between the following sleep positions: 31.2% supine;  68.8% lateral;  0% prone. Duration of total sleep and percent of total sleep in their respective position is as follows: supine 109 minutes (31%), non-supine 242 minutes (69%); right 58 minutes (17%), left 183 minutes (52%), and prone 00 minutes (0%). Total supine REM sleep time was 00 minutes (0% of total REM sleep).  Sleep  latency was mildly increased at 26.0 minutes.  REM sleep latency was increased at 163.5 minutes. Of the total sleep time, the percentage of stage N1 sleep was 14.7%, which is increased, stage N2 sleep was 66%, which is increased, stage N3 sleep was 5.1%, which is reduced, and REM sleep was 13.8%, which is reduced. Wake after sleep onset (WASO) time accounted for 97.5 minutes with sleep fragmentation noted, ranging from minimal to moderate. RESPIRATORY MONITORING:  Based on CMS criteria (using a 4% oxygen desaturation rule for scoring hypopneas), there were 0 apneas (0 obstructive; 0 central; 0 mixed), and 5 hypopneas.  Apnea index was 0.0. Hypopnea index was 0.9. The apnea-hypopnea index was 0.9/hour overall (0.5 supine, 0 non-supine; 0.0 REM, 0.0 supine REM).  There were 0 respiratory effort-related arousals (RERAs).  The RERA index was 0 events/h. Total respiratory disturbance index (RDI) was 0.9 events/h. RDI results showed: supine RDI  0.5 /h; non-supine RDI 1.0 /h; REM RDI 0.0 /h, supine REM  RDI 0.0 /h. Based on AASM criteria (using a 3% oxygen desaturation and /or arousal rule for scoring hypopneas), there were 0 apneas (0 obstructive; 0 central; 0 mixed), and 5 hypopneas. Apnea index was 0.0. Hypopnea index was 0.9. The apnea-hypopnea index was 0.9 overall (0.5 supine, 0 non-supine; 0.0 REM, 0.0 supine REM).  There were 0 respiratory effort-related arousals (RERAs).  The RERA index was 0 events/h. Total respiratory disturbance index (RDI) was 0.9 events/h. RDI results showed: supine RDI  0.5 /h; non-supine RDI 1.0 /h; REM RDI 0.0 /h, supine REM RDI 0.0 /h.  OXIMETRY: Oxyhemoglobin Saturation Nadir during sleep was at  91% from a mean of 96%.  Of the Total sleep time (TST)   hypoxemia (=<88%) was present for  0.0 minutes, or 0.0% of total sleep time.  LIMB MOVEMENTS: There were 4 periodic limb movements of sleep (0.7/hr), of which 0 (0.0/hr) were associated with an arousal.  AROUSAL: There were 73 arousals in  total, for an arousal index of 12 arousals/hour.  Of these, 5 were identified as respiratory-related arousals (1 /h), 0 were PLM-related arousals (0 /h), and 92 were non-specific arousals (16 /h).  EEG: Review of the EEG showed no abnormal electrical discharges and symmetrical bihemispheric findings.  EKG: The EKG revealed normal sinus rhythm (NSR). The average heart rate during sleep was 64 bpm. AUDIO/VIDEO REVIEW: The audio and video review did not show any abnormal or unusual behaviors, movements, phonations or vocalizations. The patient took 2 restroom breaks. Snoring was noted, in the mild range. POST-STUDY QUESTIONNAIRE: Post study, the patient indicated, that sleep was better than usual. IMPRESSION: 1. Primary Snoring 2. Dysfunctions associated with sleep stages or arousal from sleep  RECOMMENDATIONS: 1. This study does not demonstrate any significant obstructive or central sleep disordered breathing with an AHI of less than 5/hour - her AHI was 0.85/hour - and oxygen desaturation nadir was 91% for the night. Mild intermittent snoring was noted. Treatment with a positive airway pressure device, such as CPAP or autoPAP is not indicated. Some degree of weight loss and avoidance of the supine sleep position may aid in reducing her snoring.  2. This study shows sleep fragmentation and abnormal sleep stage percentages; these are nonspecific findings and per se do not signify an intrinsic sleep disorder or a cause for the patient's sleep-related symptoms. Causes include (but are not limited to) the first night effect of the sleep study, circadian rhythm disturbances, medication effect or an underlying mood disorder or medical problem. 3. The patient should be cautioned not to drive, work at heights, or operate dangerous or heavy equipment when tired or sleepy. Review and reiteration of good sleep hygiene measures should be pursued with any patient. 4. The patient will be advised to follow up with the referring  provider, who will be notified of the test results. I certify that I have reviewed the entire raw data recording prior to the issuance of this report in accordance with the Standards of Accreditation of the American Academy of Sleep Medicine (AASM). Huston Foley, MD, PhD Medical Director, [facility]) Diplomat, ABPN (Neurology and Sleep)   Technical Report: General Information Name: Renee Cross, Renee Cross BMI: 31.80 Physician: Huston Foley, MD ID: [id number] Height: 66.0 in Technician: Margaretann Loveless, RPSGT Sex: Female Weight: 197.0 lb Record: [id number] Age: 88 [January 07, 1976] Date: 10/16/2023   Medical & Medication History   49 year old female with an underlying medical history of hypertension, reflux disease, allergic rhinitis, smoking, asthma, anemia, sickle cell  trait, PTSD (by chart review) and mild obesity, who reports snoring and excessive daytime somnolence, as well as witnessed apneic pauses per daughter's report. Proventil, Ventolin, Zyrtec, Vitamin B12, Cymbalta, Nexium, Ferrous Sulfate, Flonase, Trelegy Ellipta, Duoneb, Iron, Claritin, Probera, Mobic, Robaxin, Elocon, Singulair, Multivitamin, Olmesartan, Olopatadine, Prolosec, Promethazine-DM, Seroquel, Senokot-S  Sleep Disorder    Comments  The patient came into the sleep lab for a PSG. Per the patient she took Cymbalta, Robaxin, Hydrocodone and Seroquel prior to arrival. Two restroom breaks. EKG did not show any obvious cardiac arrhythmias. Mild snoring. Respiratory events scored with a 4% desat. Spontaneous arousals noted. PLM's during REM. The patient slept lateral and supine. All sleep stages observed.   Lights out: 09:10:00 PM Lights on: 05:04:32 AM Time Total Supine Side Prone Upright Recording (TRT) 7h 54.77m 2h 59.18m 4h 55.4m 0h 0.53m 0h 0.36m Sleep (TST) 5h 51.50m 1h 49.8m 4h 1.38m 0h 0.48m 0h 0.53m Latency N1 N2 N3 REM Onset Per. Slp. Eff. Actual 0h 0.38m 0h 11.67m 5h 49.102m 2h 43.42m 0h 26.60m 0h 35.78m 73.97% Stg Dur Wake  N1 N2 N3 REM Total 123.5 51.5 233.0 18.0 48.5 Supine 69.5 17.5 74.0 18.0 0.0 Side 54.0 34.0 159.0 0.0 48.5 Prone 0.0 0.0 0.0 0.0 0.0 Upright 0.0 0.0 0.0 0.0 0.0  Stg % Wake N1 N2 N3 REM Total 26.0 14.7 66.4 5.1 13.8 Supine 14.6 5.0 21.1 5.1 0.0 Side 11.4 9.7 45.3 0.0 13.8 Prone 0.0 0.0 0.0 0.0 0.0 Upright 0.0 0.0 0.0 0.0 0.0  Apnea Summary Sub Supine Side Prone Upright Total 0 Total 0 0 0 0 0   REM 0 0 0 0 0   NREM 0 0 0 0 0 Obs 0 REM 0 0 0 0 0   NREM 0 0 0 0 0 Mix 0 REM 0 0 0 0 0   NREM 0 0 0 0 0 Cen 0 REM 0 0 0 0 0   NREM 0 0 0 0 0 Rera Summary Sub Supine Side Prone Upright Total 0 Total 0 0 0 0 0   REM 0 0 0 0 0   NREM 0 0 0 0 0  Hypopnea Summary Sub Supine Side Prone Upright Total 5 Total 5 1 4  0 0   REM 0 0 0 0 0   NREM 5 1 4  0 0 4% Hypopnea Summary Sub Supine Side Prone Upright Total (4%) 5 Total 5 1 4  0 0   REM 0 0 0 0 0   NREM 5 1 4  0 0  AHI Total Obs Mix Cen 0.85 Apnea 0.00 0.00 0.00 0.00  Hypopnea 0.85 -- -- -- 0.85 Hypopnea (4%) 0.85 -- -- --  Total Supine Side Prone Upright Position AHI 0.85 0.55 0.99 0.00 0.00 REM AHI 0.00  NREM AHI 0.99  Position RDI 0.85 0.55 0.99 0.00 0.00 REM RDI 0.00  NREM RDI 0.99  4% Hypopnea Total Supine Side Prone Upright Position AHI (4%) 0.85 0.55 0.99 0.00 0.00 REM AHI (4%) 0.00  NREM AHI (4%) 0.99  Position RDI (4%) 0.85 0.55 0.99 0.00 0.00 REM RDI (4%) 0.00  NREM RDI (4%) 0.99  Desaturation Information Threshold: 2% <100% <90% <80% <70% <60% <50% <40% Supine 34.0 0.0 0.0 0.0 0.0 0.0 0.0 Side 76.0 2.0 0.0 0.0 0.0 0.0 0.0 Prone 0.0 0.0 0.0 0.0 0.0 0.0 0.0 Upright 0.0 0.0 0.0 0.0 0.0 0.0 0.0 Total 110.0 2.0 0.0 0.0 0.0 0.0 0.0 Index 14.7 0.3 0.0 0.0 0.0 0.0 0.0 Threshold: 3% <100% <90% <  80% <70% <60% <50% <40% Supine 21.0 0.0 0.0 0.0 0.0 0.0 0.0 Side 23.0 1.0 0.0 0.0 0.0 0.0 0.0 Prone 0.0 0.0 0.0 0.0 0.0 0.0 0.0 Upright 0.0 0.0 0.0 0.0 0.0 0.0 0.0 Total 44.0 1.0 0.0 0.0 0.0 0.0 0.0 Index 5.9 0.1 0.0 0.0 0.0 0.0 0.0 Threshold: 4% <100% <90% <80% <70% <60% <50% <40% Supine 7.0  0.0 0.0 0.0 0.0 0.0 0.0 Side 12.0 1.0 0.0 0.0 0.0 0.0 0.0 Prone 0.0 0.0 0.0 0.0 0.0 0.0 0.0 Upright 0.0 0.0 0.0 0.0 0.0 0.0 0.0 Total 19.0 1.0 0.0 0.0 0.0 0.0 0.0 Index 2.5 0.1 0.0 0.0 0.0 0.0 0.0 Threshold: 3% <100% <90% <80% <70% <60% <50% <40% Supine 21 0 0 0 0 0 0 Side 23 1 0 0 0 0 0 Prone 0 0 0 0 0 0 0 Upright 0 0 0 0 0 0 0 Total 44 1 0 0 0 0 0  Awakening/Arousal Information # of Awakenings 45 Wake after sleep onset 97.29m Wake after persistent sleep 92.57m Arousal Assoc. Arousals Index Apneas 0 0.0 Hypopneas 5 0.9 Leg Movements 4 0.7 Snore 0 0.0 PTT Arousals 0 0.0 Spontaneous 92 15.7 Total 101 17.3 Leg Movement Information PLMS LMs Index Total LMs during PLMS 4 0.7 LMs w/ Microarousals 0 0.0 LM LMs Index w/ Microarousal 4 0.7 w/ Awakening 1 0.2 w/ Resp Event 0 0.0 Spontaneous 8 1.4 Total 12 2.1  Desaturation threshold setting: 3% Minimum desaturation setting: 10 seconds SaO2 nadir: 88% The longest event was a 32 sec obstructive Hypopnea with a minimum SaO2 of 91%. The lowest SaO2 was 89% associated with a 17 sec obstructive Hypopnea. EKG Rates EKG Avg Max Min Awake 68 102 59 Asleep 64 79 56 EKG Events: Tachycardia  ECHOCARDIOGRAM COMPLETE Result Date: 10/10/2023    ECHOCARDIOGRAM REPORT   Patient Name:   Renee Cross Date of Exam: 10/10/2023 Medical Rec #:  [id number]     Height:       66.0 in Accession #:    [id number]    Weight:       205.0 lb Date of Birth:  1975/11/23     BSA:          2.021 m Patient Age:    47 years      BP:           146/76 mmHg Patient Gender: F             HR:           72 bpm. Exam Location:  [place] Procedure: 2D Echo, Cardiac Doppler and Color Doppler Indications:    R07.9* Chest pain, unspecified  History:        Patient has no prior history of Echocardiogram examinations.                 Anemia, Signs/Symptoms:Chest Pain; Risk Factors:Hypertension and                 Former Smoker.  Sonographer:    Jake Seats RDMS, RVT, RDCS Referring Phys: Rito Ehrlich [facility] IMPRESSIONS   1. Left ventricular ejection fraction, by estimation, is 60 to 65%. The left ventricle has normal function. The left ventricle has no regional wall motion abnormalities. Left ventricular diastolic parameters were normal.  2. Right ventricular systolic function is normal. The right ventricular size is normal.  3. The mitral valve is normal in structure. No evidence of mitral valve regurgitation. No evidence of mitral stenosis.  4. The aortic  valve is normal in structure. Aortic valve regurgitation is not visualized. No aortic stenosis is present.  5. The inferior vena cava is normal in size with greater than 50% respiratory variability, suggesting right atrial pressure of 3 mmHg. FINDINGS  Left Ventricle: Left ventricular ejection fraction, by estimation, is 60 to 65%. The left ventricle has normal function. The left ventricle has no regional wall motion abnormalities. The left ventricular internal cavity size was normal in size. There is  no left ventricular hypertrophy. Left ventricular diastolic parameters were normal. Right Ventricle: The right ventricular size is normal. No increase in right ventricular wall thickness. Right ventricular systolic function is normal. Left Atrium: Left atrial size was normal in size. Right Atrium: Right atrial size was normal in size. Pericardium: There is no evidence of pericardial effusion. Mitral Valve: The mitral valve is normal in structure. No evidence of mitral valve regurgitation. No evidence of mitral valve stenosis. Tricuspid Valve: The tricuspid valve is normal in structure. Tricuspid valve regurgitation is not demonstrated. No evidence of tricuspid stenosis. Aortic Valve: The aortic valve is normal in structure. Aortic valve regurgitation is not visualized. No aortic stenosis is present. Aortic valve mean gradient measures 4.0 mmHg. Aortic valve peak gradient measures 7.8 mmHg. Aortic valve area, by VTI measures 1.97 cm. Pulmonic Valve: The pulmonic valve was normal  in structure. Pulmonic valve regurgitation is not visualized. No evidence of pulmonic stenosis. Aorta: The aortic root is normal in size and structure. Venous: The inferior vena cava is normal in size with greater than 50% respiratory variability, suggesting right atrial pressure of 3 mmHg. IAS/Shunts: No atrial level shunt detected by color flow Doppler.  LEFT VENTRICLE PLAX 2D LVIDd:         4.90 cm     Diastology LVIDs:         2.80 cm     LV e' medial:    7.72 cm/s LV PW:         1.00 cm     LV E/e' medial:  11.0 LV IVS:        1.10 cm     LV e' lateral:   7.83 cm/s LVOT diam:     1.80 cm     LV E/e' lateral: 10.8 LV SV:         59 LV SV Index:   29 LVOT Area:     2.54 cm  LV Volumes (MOD) LV vol d, MOD A2C: 86.3 ml LV vol d, MOD A4C: 77.3 ml LV vol s, MOD A2C: 31.5 ml LV vol s, MOD A4C: 24.8 ml LV SV MOD A2C:     54.8 ml LV SV MOD A4C:     77.3 ml LV SV MOD BP:      57.5 ml RIGHT VENTRICLE RV S prime:     10.60 cm/s TAPSE (M-mode): 2.1 cm LEFT ATRIUM             Index        RIGHT ATRIUM           Index LA diam:        3.50 cm 1.73 cm/m   RA Area:     10.20 cm LA Vol (A2C):   36.9 ml 18.26 ml/m  RA Volume:   21.80 ml  10.79 ml/m LA Vol (A4C):   47.3 ml 23.40 ml/m LA Biplane Vol: 44.7 ml 22.12 ml/m  AORTIC VALVE AV Area (Vmax):    2.27 cm AV Area (Vmean):   2.28  cm AV Area (VTI):     1.97 cm AV Vmax:           140.00 cm/s AV Vmean:          96.500 cm/s AV VTI:            0.301 m AV Peak Grad:      7.8 mmHg AV Mean Grad:      4.0 mmHg LVOT Vmax:         125.00 cm/s LVOT Vmean:        86.400 cm/s LVOT VTI:          0.233 m LVOT/AV VTI ratio: 0.77  AORTA Ao Root diam: 3.35 cm Ao Asc diam:  3.10 cm MITRAL VALVE MV Area (PHT): 3.89 cm    SHUNTS MV Decel Time: 195 msec    Systemic VTI:  0.23 m MV E velocity: 84.80 cm/s  Systemic Diam: 1.80 cm MV A velocity: 84.80 cm/s MV E/A ratio:  1.00 Gypsy Balsam MD Electronically signed by Gypsy Balsam MD Signature Date/Time: 10/10/2023/1:45:15 PM    Final     MR KNEE RIGHT WO CONTRAST Result Date: 09/07/2023 CLINICAL DATA:  Chronic right knee pain swelling, popping and instability. No known injury. EXAM: MRI OF THE RIGHT KNEE WITHOUT CONTRAST TECHNIQUE: Multiplanar, multisequence MR imaging of the knee was performed. No intravenous contrast was administered. COMPARISON:  Plain films right knee 05/31/2023. FINDINGS: MENISCI Medial meniscus:  Intact. Lateral meniscus: Rhona Raider is seen along the free edge of the body. A small tear of the capsular surface of the meniscus is identified but no tear reaching an articular surface is seen. LIGAMENTS Cruciates:  Intact. Collaterals:  Intact. 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 Medial:  Preserved. Lateral:  Mildly frayed and irregular. Joint:  Small joint effusion. Popliteal Fossa:  No Baker's cyst. Extensor Mechanism:  Intact. Bones: No fracture, stress change or worrisome lesion. There is some osteophytosis about the knee, most notable along the lateral joint line. Other: None. IMPRESSION: 1. Mild appearing osteoarthritis about the knee is most notable in the lateral compartment. 2. Fraying along the free edge of the body of the lateral meniscus with a small tear of the capsular surface of the meniscus. No tear reaching an articular surface is seen. 3. Small fissure in hyaline cartilage the medial facet adjacent to thickened medial plica compatible with medial plica syndrome. Electronically Signed   By: Drusilla Kanner M.D.   On: 09/07/2023 13:23   CT Angio Chest Pulmonary Embolism (PE) W or WO Contrast Result Date: 08/28/2023 CLINICAL DATA:  48 year old female with cough, shortness of breath, chest pressure for 3 weeks. EXAM: CT ANGIOGRAPHY CHEST WITH CONTRAST TECHNIQUE: Multidetector CT imaging of the chest was performed using the standard protocol during bolus administration of  intravenous contrast. Multiplanar CT image reconstructions and MIPs were obtained to evaluate the vascular anatomy. RADIATION DOSE REDUCTION: This exam was performed according to the departmental dose-optimization program which includes automated exposure control, adjustment of the mA and/or kV according to patient size and/or use of iterative reconstruction technique. CONTRAST:  80mL OMNIPAQUE IOHEXOL 350 MG/ML SOLN COMPARISON:  Chest radiographs 08/11/2023. CT Abdomen and Pelvis 03/28/2014. FINDINGS: Cardiovascular: Adequate contrast bolus timing in the pulmonary arterial tree. No pulmonary artery filling defect identified. Minimal respiratory motion. Heart size is normal. No pericardial effusion. Negative visible aorta. Questionable calcified left coronary artery plaque on series 10,  image 166. Mediastinum/Nodes: No mediastinal mass or lymphadenopathy. Small chronic gastric hiatal hernia appears stable since 2015. Lungs/Pleura: Major airways are patent with minimal retained secretions in the trachea. Mild bronchial wall thickening affecting segmental and subsegmental airways. Subtle tree-in-bud nodular opacity in the upper lobes. No consolidation. No pleural effusion. No suspicious lung nodule. Upper Abdomen: Negative visible liver, spleen, pancreas, adrenal glands, kidneys, intra-abdominal stomach and splenic flexure of bowel. Musculoskeletal: No acute osseous abnormality identified. Review of the MIP images confirms the above findings. IMPRESSION: 1. Negative for pulmonary embolus. 2. Mild bilateral airway, bilateral upper lobe pulmonary inflammatory changes. Consider distal airway infection, viral/atypical etiology. No focal pneumonia or pleural effusion. 3. Small chronic gastric hiatal hernia. 4. Questionable calcified left coronary artery atherosclerosis. Electronically Signed   By: Odessa Fleming M.D.   On: 08/28/2023 10:32   DG Chest 2 View Result Date: 08/11/2023 CLINICAL DATA:  Cough and hemoptysis EXAM:  CHEST - 2 VIEW COMPARISON:  X-ray 07/31/2018. FINDINGS: The heart size and mediastinal contours are within normal limits. Both lungs are clear. Hyperinflation. No consolidation, pneumothorax, effusion or edema. The visualized skeletal structures are unremarkable. IMPRESSION: No acute cardiopulmonary disease. Electronically Signed   By: Karen Kays M.D.   On: 08/11/2023 16:56    Recent Results (from the past 2160 hours)  POC COVID-19 BinaxNow     Status: None   Collection Time: 08/07/23  4:43 PM  Result Value Ref Range   SARS Coronavirus 2 Ag Negative Negative  POCT Influenza A/B     Status: None   Collection Time: 08/07/23  4:43 PM  Result Value Ref Range   Influenza A, POC Negative Negative   Influenza B, POC Negative Negative  Pro b natriuretic peptide     Status: None   Collection Time: 08/25/23  2:44 PM  Result Value Ref Range   NT-Pro BNP 69 0 - 249 pg/mL    Comment: The following cut-points have been suggested for the use of proBNP for the diagnostic evaluation of heart failure (HF) in patients with acute dyspnea: Modality                     Age           Optimal Cut                            (years)            Point ------------------------------------------------------ Diagnosis (rule in HF)        <50            450 pg/mL                           50 - 75            900 pg/mL                               >75           1800 pg/mL Exclusion (rule out HF)  Age independent     300 pg/mL   CBC w/Diff     Status: Abnormal   Collection Time: 08/25/23  2:44 PM  Result Value Ref Range   WBC 7.7 4.0 - 10.5 K/uL   RBC 4.66 3.87 - 5.11 Mil/uL   Hemoglobin 13.3 12.0 - 15.0 g/dL  HCT 40.9 36.0 - 46.0 %   MCV 87.9 78.0 - 100.0 fl   MCHC 32.5 30.0 - 36.0 g/dL   RDW 40.9 (H) 81.1 - 91.4 %   Platelets 205.0 150.0 - 400.0 K/uL   Neutrophils Relative % 70.6 43.0 - 77.0 %   Lymphocytes Relative 20.6 12.0 - 46.0 %   Monocytes Relative 6.5 3.0 - 12.0 %   Eosinophils Relative 0.6 0.0  - 5.0 %   Basophils Relative 1.7 0.0 - 3.0 %   Neutro Abs 5.5 1.4 - 7.7 K/uL   Lymphs Abs 1.6 0.7 - 4.0 K/uL   Monocytes Absolute 0.5 0.1 - 1.0 K/uL   Eosinophils Absolute 0.0 0.0 - 0.7 K/uL   Basophils Absolute 0.1 0.0 - 0.1 K/uL  Comp Met (CMET)     Status: None   Collection Time: 08/25/23  2:44 PM  Result Value Ref Range   Sodium 138 135 - 145 mEq/L   Potassium 3.9 3.5 - 5.1 mEq/L   Chloride 108 96 - 112 mEq/L   CO2 25 19 - 32 mEq/L   Glucose, Bld 93 70 - 99 mg/dL   BUN 9 6 - 23 mg/dL   Creatinine, Ser 7.82 0.40 - 1.20 mg/dL   Total Bilirubin 0.4 0.2 - 1.2 mg/dL   Alkaline Phosphatase 68 39 - 117 U/L   AST 16 0 - 37 U/L   ALT 14 0 - 35 U/L   Total Protein 6.8 6.0 - 8.3 g/dL   Albumin 4.2 3.5 - 5.2 g/dL   GFR 956.21 >30.86 mL/min    Comment: Calculated using the CKD-EPI Creatinine Equation (2021)   Calcium 8.8 8.4 - 10.5 mg/dL  ECHOCARDIOGRAM COMPLETE     Status: None   Collection Time: 10/10/23 10:03 AM  Result Value Ref Range   S' Lateral 2.80 cm   Area-P 1/2 3.89 cm2   Ao pk vel 1.40 m/s   AR max vel 2.27 cm2   AV Peak grad 7.8 mmHg   AV Area VTI 1.97 cm2   AV Area mean vel 2.28 cm2   AV Mean grad 4.0 mmHg   Single Plane A2C EF 63.5 %   Single Plane A4C EF 67.9 %   Calc EF 65.9 %   Est EF 60 - 65%         Garner Nash, MD, MS

## 2023-10-20 NOTE — Assessment & Plan Note (Signed)
Known IDA anemia and B12 deficiency anemia; patient requests re-evaluation of levels; reports constipation due to iron supplements; not taking medication to alleviate constipation.  Plan: Order labs: CBC, iron panel, B12, folate, thyroid panel. Continue iron supplements. Recommend OTC stool softeners or laxatives as needed to address constipation.

## 2023-10-20 NOTE — Telephone Encounter (Signed)
Pharmacy Patient Advocate Encounter   Received notification from Physician's Office that prior authorization for Wegovy 0.25MG /0.5ML auto-injectors is required/requested.   Insurance verification completed.   The patient is insured through [facility] [place] .   Per test claim: PA required; PA submitted to above mentioned insurance via CoverMyMeds Key/confirmation #/EOC [id number] Status is pending

## 2023-10-21 LAB — CBC WITH DIFFERENTIAL/PLATELET
Absolute Lymphocytes: 1843 {cells}/uL (ref 850–3900)
Absolute Monocytes: 506 {cells}/uL (ref 200–950)
Basophils Absolute: 17 {cells}/uL (ref 0–200)
Basophils Relative: 0.2 %
Eosinophils Absolute: 58 {cells}/uL (ref 15–500)
Eosinophils Relative: 0.7 %
HCT: 40 % (ref 35.0–45.0)
Hemoglobin: 13.5 g/dL (ref 11.7–15.5)
MCH: 29.3 pg (ref 27.0–33.0)
MCHC: 33.8 g/dL (ref 32.0–36.0)
MCV: 87 fL (ref 80.0–100.0)
MPV: 11.3 fL (ref 7.5–12.5)
Monocytes Relative: 6.1 %
Neutro Abs: 5876 {cells}/uL (ref 1500–7800)
Neutrophils Relative %: 70.8 %
Platelets: 237 10*3/uL (ref 140–400)
RBC: 4.6 10*6/uL (ref 3.80–5.10)
RDW: 14 % (ref 11.0–15.0)
Total Lymphocyte: 22.2 %
WBC: 8.3 10*3/uL (ref 3.8–10.8)

## 2023-10-21 LAB — FOLATE: Folate: 8.7 ng/mL

## 2023-10-21 LAB — THYROID PANEL WITH TSH
Free Thyroxine Index: 1.9 (ref 1.4–3.8)
T3 Uptake: 30 % (ref 22–35)
T4, Total: 6.3 ug/dL (ref 5.1–11.9)
TSH: 1.32 m[IU]/L

## 2023-10-21 LAB — FERRITIN: Ferritin: 36 ng/mL (ref 16–232)

## 2023-10-21 LAB — VITAMIN B12: Vitamin B-12: 406 pg/mL (ref 200–1100)

## 2023-10-23 ENCOUNTER — Other Ambulatory Visit (HOSPITAL_COMMUNITY): Payer: Self-pay

## 2023-10-23 ENCOUNTER — Encounter: Payer: Self-pay | Admitting: Family Medicine

## 2023-10-23 LAB — ALLERGY PANEL 11, MOLD GROUP
Allergen, A. alternata, m6: <0.1 kU/L
Allergen, Mucor Racemosus, M4: <0.1 kU/L
Aspergillus fumigatus, m3: <0.1 kU/L
CLADOSPORIUM HERBARUM (M2) IGE: <0.1 kU/L
CLASS: 0
CLASS: 0
Candida Albicans: <0.1 kU/L
Class: 0
Class: 0
Class: 0

## 2023-10-23 LAB — IRON, TOTAL/TOTAL IRON BINDING CAP
%SAT: 15 % — ABNORMAL LOW (ref 16–45)
Iron: 49 ug/dL (ref 40–190)
TIBC: 331 ug/dL (ref 250–450)

## 2023-10-23 LAB — INTERPRETATION:

## 2023-10-23 MED ORDER — TEZSPIRE 210 MG/1.91ML ~~LOC~~ SOSY
210.0000 mg | PREFILLED_SYRINGE | SUBCUTANEOUS | 11 refills | Status: AC
  Filled 2023-10-26: qty 1.91, 28d supply, fill #0

## 2023-10-23 NOTE — Telephone Encounter (Signed)
Pharmacy Patient Advocate Encounter  Received notification from [facility] [employer] that Prior Authorization for [facility] 0.25MG /0.5ML auto-injectors has been APPROVED from 10/20/2023 to 04/17/2024   PA #/Case ID/Reference #: [id number]

## 2023-10-23 NOTE — Telephone Encounter (Signed)
I called and spoke with patient and notified her prior Renee Cross of Reginal Lutes was approved and to call pharmacy to make sure that they have in stock to fill.

## 2023-10-23 NOTE — Telephone Encounter (Signed)
Spoke to patient and advised still shows [employer]. I have gotten approval will send to [facility] and see if they have any issues with dispense. I did advise patient she may have to reach out to [employer] to fix primary coverage

## 2023-10-23 NOTE — Addendum Note (Signed)
Addended by: Devoria Glassing on: 10/23/2023 05:32 PM   Modules accepted: Orders

## 2023-10-24 ENCOUNTER — Other Ambulatory Visit (HOSPITAL_COMMUNITY): Payer: Self-pay

## 2023-10-24 ENCOUNTER — Telehealth: Payer: Self-pay | Admitting: Family Medicine

## 2023-10-24 ENCOUNTER — Other Ambulatory Visit: Payer: Self-pay

## 2023-10-24 ENCOUNTER — Encounter (HOSPITAL_COMMUNITY): Payer: Self-pay

## 2023-10-24 NOTE — Telephone Encounter (Signed)
Copied from CRM [id number]. Topic: Clinical - Medication Question >> Oct 24, 2023  2:40 PM Fredrich Romans wrote: Reason for CRM: patient would like to know if office has received a fax from pharmacy regarding Albuterol-Budesonide (AIRSUPRA) 90-80 MCG/ACT AERO

## 2023-10-24 NOTE — Progress Notes (Signed)
Specialty Pharmacy Initiation Note   Renee Cross is a 48 y.o. female who will be followed by the specialty pharmacy service for RxSp Asthma/COPD    Review of administration, indication, effectiveness, safety, potential side effects, storage/disposable, and missed dose instructions occurred today for patient's specialty medication(s) [name])     Patient/Caregiver did not have any additional questions or concerns.   Patient's therapy is appropriate to: Initiate    Goals Addressed             This Visit's Progress    Minimize recurrence of flares       Patient is initiating therapy. Patient will maintain adherence         Servando Snare Specialty Pharmacist

## 2023-10-25 ENCOUNTER — Other Ambulatory Visit (HOSPITAL_COMMUNITY): Payer: Self-pay

## 2023-10-25 ENCOUNTER — Telehealth: Payer: Self-pay

## 2023-10-25 NOTE — Telephone Encounter (Signed)
Can we get a prior auth on Albuterol-Budesonide Paulene Floor) 90-80mcg?

## 2023-10-25 NOTE — Telephone Encounter (Signed)
Pharmacy Patient Advocate Encounter   Received notification from Pt Calls Messages that prior authorization for Airsupra 90-68mcg is required/requested.   Insurance verification completed.   The patient is insured through [facility] .   Per test claim: PA required; PA submitted to above mentioned insurance via CoverMyMeds Key/confirmation #/EOC [id number] Status is pending

## 2023-10-25 NOTE — Telephone Encounter (Signed)
I called and spoke with patient and told her that we have not received anything from her pharmacy. Patient said that pharmacy said that Rx needs a prior auth. I told patient that I will send request to our prior auth depart to get started on this.

## 2023-10-26 ENCOUNTER — Other Ambulatory Visit (HOSPITAL_COMMUNITY): Payer: Self-pay

## 2023-10-26 ENCOUNTER — Ambulatory Visit (INDEPENDENT_AMBULATORY_CARE_PROVIDER_SITE_OTHER): Payer: 59 | Admitting: Licensed Clinical Social Worker

## 2023-10-26 ENCOUNTER — Other Ambulatory Visit: Payer: Self-pay

## 2023-10-26 DIAGNOSIS — F331 Major depressive disorder, recurrent, moderate: Secondary | ICD-10-CM | POA: Diagnosis not present

## 2023-10-26 NOTE — Telephone Encounter (Signed)
Pharmacy Patient Advocate Encounter  Received notification from [facility] that Prior Authorization for Airsupra 90-44mcg has been APPROVED from 10/25/23 to 10/23/24   PA #/Case ID/Reference #: [id number]  Left a voicemail at [facility] to notify of the approval.

## 2023-10-26 NOTE — Progress Notes (Signed)
THERAPIST PROGRESS NOTE   Session Date: 10/26/2023  Session Time: 1109 - 1149 Virtual Visit via Video Note  I connected with Renee Cross on 10/26/23 at 11:09 AM EST by a video enabled telemedicine application and verified that I am speaking with the correct person using two identifiers.  Location: Patient: Home Provider: Home Office   I discussed the limitations of evaluation and management by telemedicine and the availability of in person appointments. The patient expressed understanding and agreed to proceed.  I discussed the assessment and treatment plan with the patient. The patient was provided an opportunity to ask questions and all were answered. The patient agreed with the plan and demonstrated an understanding of the instructions.   The patient was advised to call back or seek an in-person evaluation if the symptoms worsen or if the condition fails to improve as anticipated.  I provided 40 minutes of non-face-to-face time during this encounter.  Participation Level: Active  Behavioral Response: CasualAlertDepressed, Irritable, and tearful  Type of Therapy: Individual Therapy  Treatment Goals addressed:  - LTG: Reduce frequency, intensity, and duration of depression symptoms so that daily functioning is improved (OP Depression) - LTG: Increase coping skills to manage depression and improve ability to perform daily activities (OP Depression) - STG: Renee Cross will identify cognitive patterns and beliefs that support depression (OP Depression) - STG: Renee Cross will reduce frequency of avoidant behaviors by 50% as evidenced by self-report in therapy sessions (OP Depression) - LTG: "Improve my attitude and be able to go out and enjoy things and people" (OP Depression)  ProgressTowards Goals: Progressing  Interventions: CBT, Motivational Interviewing, Solution Focused, and Supportive  Summary: Renee Cross is a 48 y.o. female with past psych history of MDD, presenting for follow-up  therapy session in efforts to improve management of depressive symptoms. Patient initially presented as reluctant to engage, proving disinterested in facilitating session via virtual means. Pt proved open to engaging in assessing presentation of depressive and anxious sxs experienced over recent weeks, engaging in completion of PHQ9 and GAD7, further continuing engagement in discussion surrounding variances in frequency and severity of sxs. Pt detailed challenges related to recent holidays, anniversary of mother's passing, and increased stress related to daughter and the way in which she is raising her son, being pt's grandson. Pt detailed additional factors surrounding disability denial proving to create added stress for her, further increasing anxiety and financial worries. Pt proved open to beginning journaling in efforts to aid in navigating various worries in efforts to decrease frequency of constant worrying and over thinking.  Patient responded well to interventions. Patient continues to meet criteria for MDD. Patient will continue to benefit from engagement in outpatient therapy due to being the least restrictive service to meet presenting needs.      10/26/2023   11:15 AM 09/07/2023   11:15 AM 08/23/2023    1:13 PM 05/16/2023   11:54 AM  GAD 7 : Generalized Anxiety Score  Nervous, Anxious, on Edge 1 0 1 0  Control/stop worrying 3 1 3 2   Worry too much - different things 1 1 1 2   Trouble relaxing 2 1 1 3   Restless 1 0 0 2  Easily annoyed or irritable 2 1 3 1   Afraid - awful might happen 3 3 3 3   Total GAD 7 Score 13 7 12 13   Anxiety Difficulty Very difficult Somewhat difficult Somewhat difficult Somewhat difficult      10/26/2023   11:17 AM 10/20/2023    2:02 PM 09/07/2023  11:18 AM 08/23/2023    1:17 PM 05/16/2023   11:54 AM  Depression screen PHQ 2/9  Decreased Interest 2 0 3 3 2   Down, Depressed, Hopeless 1 0 1 1 1   PHQ - 2 Score 3 0 4 4 3   Altered sleeping 3  3 3 2   Tired,  decreased energy 2  3 3 2   Change in appetite 2  3 1 2   Feeling bad or failure about yourself  1  1 1 2   Trouble concentrating 0  1 1 0  Moving slowly or fidgety/restless 1  1 1 3   Suicidal thoughts 0  0 0 0  PHQ-9 Score 12  16 14 14   Difficult doing work/chores Somewhat difficult  Somewhat difficult Somewhat difficult Very difficult   Suicidal/Homicidal: No  Therapist Response: Clinician utilized CBT, MI, Solution Focused, and Supportive reflection techniques to address and support pt in navigating presenting MH sxs.  Actively assessed pt mood and affect, engaging in review of weeks since previous session, actively listening to recounts of events, evoking pt's thoughts, feelings, and perspectives related to stressors, challenges, and events. Inquired as to whether pt believed own past trauma proved to play a role in daughters treatment of son, and encouraged pt to explore this possibility over coming weeks. Encouraged pt to begin journaling in efforts to gather and process thoughts in aims of reducing over thinking and increased worries.  Clinician reassessed severity of sxs, and presence of any safety concerns. Clinician provided support and empathy to patient during session.   Plan: Return again in 2 weeks.  Diagnosis:  Encounter Diagnosis  Name Primary?   MDD (major depressive disorder), recurrent episode, moderate (HCC) Yes     Collaboration of Care: Other None necessary at this time.  Patient/Guardian was advised Release of Information must be obtained prior to any record release in order to collaborate their care with an outside provider. Patient/Guardian was advised if they have not already done so to contact the registration department to sign all necessary forms in order for [place] to release information regarding their care.   Consent: Patient/Guardian gives verbal consent for treatment and assignment of benefits for services provided during this visit. Patient/Guardian expressed  understanding and agreed to proceed.   Leisa Lenz, MSW, LCSW 10/26/2023,  10:39 PM

## 2023-10-26 NOTE — Progress Notes (Signed)
Patient has no income, stated she will call back when she has the $4.00 for copay

## 2023-10-27 DIAGNOSIS — D509 Iron deficiency anemia, unspecified: Secondary | ICD-10-CM | POA: Diagnosis not present

## 2023-10-27 DIAGNOSIS — K219 Gastro-esophageal reflux disease without esophagitis: Secondary | ICD-10-CM | POA: Diagnosis not present

## 2023-10-31 ENCOUNTER — Ambulatory Visit (HOSPITAL_COMMUNITY): Payer: 59

## 2023-11-02 DIAGNOSIS — G8929 Other chronic pain: Secondary | ICD-10-CM | POA: Diagnosis not present

## 2023-11-02 DIAGNOSIS — M533 Sacrococcygeal disorders, not elsewhere classified: Secondary | ICD-10-CM | POA: Diagnosis not present

## 2023-11-04 DIAGNOSIS — Z419 Encounter for procedure for purposes other than remedying health state, unspecified: Secondary | ICD-10-CM | POA: Diagnosis not present

## 2023-11-05 ENCOUNTER — Other Ambulatory Visit: Payer: Self-pay | Admitting: Family Medicine

## 2023-11-05 DIAGNOSIS — J302 Other seasonal allergic rhinitis: Secondary | ICD-10-CM

## 2023-11-05 DIAGNOSIS — J4551 Severe persistent asthma with (acute) exacerbation: Secondary | ICD-10-CM

## 2023-11-07 ENCOUNTER — Other Ambulatory Visit: Payer: Self-pay | Admitting: Allergy and Immunology

## 2023-11-08 ENCOUNTER — Other Ambulatory Visit (HOSPITAL_COMMUNITY): Payer: Self-pay

## 2023-11-08 ENCOUNTER — Ambulatory Visit (HOSPITAL_COMMUNITY): Payer: 59 | Admitting: Licensed Clinical Social Worker

## 2023-11-10 ENCOUNTER — Other Ambulatory Visit: Payer: Self-pay | Admitting: Allergy and Immunology

## 2023-11-13 ENCOUNTER — Ambulatory Visit: Payer: 59 | Admitting: Dietician

## 2023-11-14 DIAGNOSIS — J3489 Other specified disorders of nose and nasal sinuses: Secondary | ICD-10-CM | POA: Diagnosis not present

## 2023-11-14 DIAGNOSIS — J339 Nasal polyp, unspecified: Secondary | ICD-10-CM | POA: Diagnosis not present

## 2023-11-15 DIAGNOSIS — Z419 Encounter for procedure for purposes other than remedying health state, unspecified: Secondary | ICD-10-CM | POA: Diagnosis not present

## 2023-11-21 ENCOUNTER — Ambulatory Visit: Payer: 59 | Admitting: Family Medicine

## 2023-11-23 ENCOUNTER — Ambulatory Visit (INDEPENDENT_AMBULATORY_CARE_PROVIDER_SITE_OTHER): Payer: 59 | Admitting: Licensed Clinical Social Worker

## 2023-11-23 DIAGNOSIS — Z91199 Patient's noncompliance with other medical treatment and regimen due to unspecified reason: Secondary | ICD-10-CM

## 2023-11-23 NOTE — Progress Notes (Signed)
 THERAPIST PROGRESS NOTE   Session Date: 11/23/2023  Session Time: 1100  [facility]    Clinician attempted to connect with patient for scheduled appointment via MyChart video, sending text request x3 with no response; also attempted to connect via phone without success. Clinician left message for patient to call office to reschedule therapy appointment.     Attempt 1: Text and email: 1105   Attempt 2: Text: 1110    Attempt 3: Text: 1113   Left video chat open until:  1115     Per [place] policy, after multiple attempts to reach patient unsuccessfully at appointed time, visit will be coded as a no show.  Leisa Lenz, MSW, LCSW 11/23/2023,  11:42 AM

## 2023-12-02 DIAGNOSIS — Z419 Encounter for procedure for purposes other than remedying health state, unspecified: Secondary | ICD-10-CM | POA: Diagnosis not present

## 2023-12-04 DIAGNOSIS — J339 Nasal polyp, unspecified: Secondary | ICD-10-CM | POA: Diagnosis not present

## 2023-12-05 ENCOUNTER — Ambulatory Visit: Payer: 59 | Admitting: Allergy and Immunology

## 2023-12-07 ENCOUNTER — Ambulatory Visit (HOSPITAL_COMMUNITY): Payer: 59 | Admitting: Licensed Clinical Social Worker

## 2023-12-07 DIAGNOSIS — K635 Polyp of colon: Secondary | ICD-10-CM | POA: Diagnosis not present

## 2023-12-07 DIAGNOSIS — D125 Benign neoplasm of sigmoid colon: Secondary | ICD-10-CM | POA: Diagnosis not present

## 2023-12-07 DIAGNOSIS — K648 Other hemorrhoids: Secondary | ICD-10-CM | POA: Diagnosis not present

## 2023-12-07 DIAGNOSIS — K219 Gastro-esophageal reflux disease without esophagitis: Secondary | ICD-10-CM | POA: Diagnosis not present

## 2023-12-07 DIAGNOSIS — D509 Iron deficiency anemia, unspecified: Secondary | ICD-10-CM | POA: Diagnosis not present

## 2023-12-09 ENCOUNTER — Other Ambulatory Visit (HOSPITAL_COMMUNITY): Payer: Self-pay | Admitting: Psychiatry

## 2023-12-09 DIAGNOSIS — F1210 Cannabis abuse, uncomplicated: Secondary | ICD-10-CM

## 2023-12-09 DIAGNOSIS — F4312 Post-traumatic stress disorder, chronic: Secondary | ICD-10-CM

## 2023-12-09 DIAGNOSIS — F1010 Alcohol abuse, uncomplicated: Secondary | ICD-10-CM

## 2023-12-09 DIAGNOSIS — F331 Major depressive disorder, recurrent, moderate: Secondary | ICD-10-CM

## 2023-12-12 ENCOUNTER — Encounter (HOSPITAL_COMMUNITY): Payer: Self-pay | Admitting: Licensed Clinical Social Worker

## 2023-12-12 ENCOUNTER — Ambulatory Visit (INDEPENDENT_AMBULATORY_CARE_PROVIDER_SITE_OTHER): Payer: Self-pay | Admitting: Licensed Clinical Social Worker

## 2023-12-12 ENCOUNTER — Encounter (HOSPITAL_COMMUNITY): Payer: Self-pay

## 2023-12-12 DIAGNOSIS — Z91199 Patient's noncompliance with other medical treatment and regimen due to unspecified reason: Secondary | ICD-10-CM

## 2023-12-12 NOTE — Progress Notes (Signed)
 THERAPIST PROGRESS NOTE   Session Date: 12/12/2023  Session Time: 1100  [facility]    Clinician attempted to connect with patient for scheduled appointment via MyChart video, sending text request x2 with no response.     Attempt 1: Text: 1009   Attempt 2: Text: 1012   Left video chat open until:  1015     Per [place] policy, after multiple attempts to reach patient unsuccessfully at appointed time, visit will be coded as a no show.  Missed appt results in pt's third no-show appt in addition to (12/18, 02/20). Pt will be discharged from practice due to inconsistent engagement in tx. Office will mail official discharge notice.  Leisa Lenz, MSW, LCSW 12/12/2023,  10:15 AM

## 2023-12-25 ENCOUNTER — Ambulatory Visit: Payer: 59 | Admitting: Family Medicine

## 2023-12-25 ENCOUNTER — Ambulatory Visit: Payer: 59 | Admitting: Dietician

## 2023-12-25 ENCOUNTER — Telehealth: Payer: Self-pay | Admitting: *Deleted

## 2023-12-25 DIAGNOSIS — M25562 Pain in left knee: Secondary | ICD-10-CM | POA: Diagnosis not present

## 2023-12-25 DIAGNOSIS — M1711 Unilateral primary osteoarthritis, right knee: Secondary | ICD-10-CM | POA: Diagnosis not present

## 2023-12-25 DIAGNOSIS — M1612 Unilateral primary osteoarthritis, left hip: Secondary | ICD-10-CM | POA: Diagnosis not present

## 2023-12-25 NOTE — Telephone Encounter (Signed)
   Pre-operative Risk Assessment    Patient Name: Renee Cross  DOB: [birth year] MRN: [id number]   Date of last office visit: 09/13/23 [facility]. [facility] Date of next office visit: NONE   Request for Surgical Clearance    Procedure:   LEFT TOTAL HIP ARTHROPLASTY  Date of Surgery:  Clearance TBD                                Surgeon:  DR. Margarita Rana  Surgeon's Group or Practice Name:  [facility] Phone number:  [phone] EXT [address] Fax number:  [phone]   Type of Clearance Requested:   - Medical ; NONE INDICATED ON FORM TO BE HELD   Type of Anesthesia:  Spinal   Additional requests/questions:    Elpidio Anis   12/25/2023, 4:01 PM

## 2023-12-26 ENCOUNTER — Ambulatory Visit: Admitting: Family Medicine

## 2023-12-26 ENCOUNTER — Telehealth: Payer: Self-pay | Admitting: *Deleted

## 2023-12-26 NOTE — Telephone Encounter (Signed)
 S/w the pt and she has been scheduled tele preop appt 12/28/23. Engineer, civil (consulting) office won't schedule surgery until pt has been cleared.   Med rec and consent are done.

## 2023-12-26 NOTE — Telephone Encounter (Signed)
 Primary Cardiologist:Rajan R Revankar, MD   Preoperative team, please contact this patient and set up a phone call appointment for further preoperative risk assessment. Please obtain consent and complete medication review. Thank you for your help.   I confirm that guidance regarding antiplatelet and oral anticoagulation therapy has been completed and, if necessary, noted below (none requested).  I also confirmed the patient resides in the state of [place]. As per [facility] Medical Board telemedicine laws, the patient must reside in the state in which the provider is licensed.   Levi Aland, NP-C  12/26/2023, 7:56 AM [address], [address] Office [phone] Fax [phone]

## 2023-12-26 NOTE — Telephone Encounter (Signed)
 S/w the pt and she has been scheduled tele preop appt 12/28/23. Engineer, civil (consulting) office won't schedule surgery until pt has been cleared.   Med rec and consent are done.     Patient Consent for Virtual Visit        Renee Cross has provided verbal consent on 12/26/2023 for a virtual visit (video or telephone).   CONSENT FOR VIRTUAL VISIT FOR:  Renee Cross  By participating in this virtual visit I agree to the following:  I hereby voluntarily request, consent and authorize [facility] and its employed or contracted physicians, physician assistants, nurse practitioners or other licensed health care professionals (the Practitioner), to provide me with telemedicine health care services (the "Services") as deemed necessary by the treating Practitioner. I acknowledge and consent to receive the Services by the Practitioner via telemedicine. I understand that the telemedicine visit will involve communicating with the Practitioner through live audiovisual communication technology and the disclosure of certain medical information by electronic transmission. I acknowledge that I have been given the opportunity to request an in-person assessment or other available alternative prior to the telemedicine visit and am voluntarily participating in the telemedicine visit.  I understand that I have the right to withhold or withdraw my consent to the use of telemedicine in the course of my care at any time, without affecting my right to future care or treatment, and that the Practitioner or I may terminate the telemedicine visit at any time. I understand that I have the right to inspect all information obtained and/or recorded in the course of the telemedicine visit and may receive copies of available information for a reasonable fee.  I understand that some of the potential risks of receiving the Services via telemedicine include:  Delay or interruption in medical evaluation due to technological equipment  failure or disruption; Information transmitted may not be sufficient (e.g. poor resolution of images) to allow for appropriate medical decision making by the Practitioner; and/or  In rare instances, security protocols could fail, causing a breach of personal health information.  Furthermore, I acknowledge that it is my responsibility to provide information about my medical history, conditions and care that is complete and accurate to the best of my ability. I acknowledge that Practitioner's advice, recommendations, and/or decision may be based on factors not within their control, such as incomplete or inaccurate data provided by me or distortions of diagnostic images or specimens that may result from electronic transmissions. I understand that the practice of medicine is not an exact science and that Practitioner makes no warranties or guarantees regarding treatment outcomes. I acknowledge that a copy of this consent can be made available to me via my patient portal [facility] MyChart), or I can request a printed copy by calling the office of  [facility].    I understand that my insurance will be billed for this visit.   I have read or had this consent read to me. I understand the contents of this consent, which adequately explains the benefits and risks of the Services being provided via telemedicine.  I have been provided ample opportunity to ask questions regarding this consent and the Services and have had my questions answered to my satisfaction. I give my informed consent for the services to be provided through the use of telemedicine in my medical care

## 2023-12-28 ENCOUNTER — Ambulatory Visit: Attending: Physician Assistant

## 2023-12-28 ENCOUNTER — Telehealth (HOSPITAL_COMMUNITY): Payer: Self-pay | Admitting: *Deleted

## 2023-12-28 DIAGNOSIS — Z01810 Encounter for preprocedural cardiovascular examination: Secondary | ICD-10-CM | POA: Diagnosis not present

## 2023-12-28 NOTE — Telephone Encounter (Signed)
 Patient given detailed instructions per Myocardial Perfusion Study Information Sheet for the test on 01/01/2024 at 12:45. Patient notified to arrive 15 minutes early and that it is imperative to arrive on time for appointment to keep from having the test rescheduled.  If you need to cancel or reschedule your appointment, please call the office within 24 hours of your appointment. . Patient verbalized understanding.Renee Cross

## 2023-12-28 NOTE — Progress Notes (Signed)
 Virtual Visit via Telephone Note   Because of Renee Cross co-morbid illnesses, she is at least at moderate risk for complications without adequate follow up.  This format is felt to be most appropriate for this patient at this time.  Due to technical limitations with video connection (technology), today's appointment will be conducted as an audio only telehealth visit, and Renee Cross verbally agreed to proceed in this manner.   All issues noted in this document were discussed and addressed.  No physical exam could be performed with this format.  Evaluation Performed:  Preoperative cardiovascular risk assessment _____________   Date:  12/28/2023   Patient ID:  Renee Cross, DOB 1976-08-23, MRN [id number] Patient Location:  Home Renee Cross location:   Office  Primary Care Renee Cross:  Garnette Gunner, Renee Cross Primary Cardiologist:  Renee Brothers, Renee Cross  Chief Complaint / Patient Profile   48 y.o. y/o female with a h/o hypertension, chest tightness, obesity, cardiac murmur who is pending left hip scope  and presents today for telephonic preoperative cardiovascular risk assessment.  History of Present Illness    Renee Cross is a 48 y.o. female who presents via audio/video conferencing for a telehealth visit today.  Pt was last seen in cardiology clinic on 09/13/2023 by Dr. Tomie Cross.  At that time Renee Cross was doing well but due to multiple risk factors it was scheduled for echocardiogram and Lexi scan to further assess.  The patient is now pending procedure as outlined above. Since her last visit, she tells me she has not had any chest pains but she does suffer from chronic shortness of breath due to her asthma.  She does not do much housework due to allergies.  I do feel that since her activity is very limited we need to go forward with a stress test prior to clearance.  Most of her limitations do stem from her hip and not from her cardiac symptoms.  There are no medications that need  to be held.  Past Medical History    Past Medical History:  Diagnosis Date   Abnormal uterine bleeding (AUB) 12/22/2022   Acute recurrent sinusitis 01/27/2021   Alcohol use disorder 01/14/2023   Amblyopia of left eye 12/22/2022   Anemia    Arthritis    Asthma 09/12/2023   B12 deficiency 01/14/2023   Bilateral knee pain 06/28/2023   Class 1 drug-induced obesity with serious comorbidity and body mass index (BMI) of 33.0 to 33.9 in adult 08/26/2023   Depression    Elevated rheumatoid factor 12/28/2021   Encounter for disability assessment 12/22/2022   Episode of recurrent major depressive disorder (HCC) 01/14/2023   Financial difficulty 01/14/2023   Gastroesophageal reflux disease 12/22/2022   Hiatal hernia with GERD    Hip arthritis 12/22/2022   History of suicide attempt 01/14/2023   Hypertension    Iron deficiency anemia due to chronic blood loss 03/31/2022   Left groin mass 09/17/2020   MDD (major depressive disorder), recurrent episode, moderate (HCC) 08/23/2023   Microcytic hypochromic anemia 12/28/2021   Multiple allergies 09/12/2023   Nasal polyp 01/27/2021   Polypectomy over 20 years ago.     Non-recurrent acute suppurative otitis media of left ear without spontaneous rupture of tympanic membrane 05/16/2023   Osteoarthritis of both hips 01/21/2022   Osteoarthritis of right knee 07/02/2023   Overuse of medication 12/22/2022   Perennial allergic rhinitis with seasonal variation 12/22/2022   Pneumonia    Primary hypertension 12/22/2022   Primary osteoarthritis of both  knees 01/21/2022   PTSD (post-traumatic stress disorder)    Rheumatoid arthritis (HCC) 12/22/2022   Screening for colon cancer 08/26/2023   Severe persistent asthma with exacerbation 12/22/2022   Sickle cell trait (HCC)    Tobacco use disorder 05/16/2023   Past Surgical History:  Procedure Laterality Date   HERNIA REPAIR     SINUS EXPLORATION     STOMACH SURGERY  10/03/2001   Possible  fundiplication?   TONSILLECTOMY     TOTAL HIP ARTHROPLASTY Right 02/14/2023   Procedure: TOTAL HIP ARTHROPLASTY ANTERIOR APPROACH;  Surgeon: Renee Apley, Renee Cross;  Location: [facility];  Service: Orthopedics;  Laterality: Right;   TUBAL LIGATION      Allergies  No Known Allergies  Home Medications    Prior to Admission medications   Medication Sig Start Date End Date Taking? Authorizing Renee Cross  albuterol (VENTOLIN HFA) 108 (90 Base) MCG/ACT inhaler 2 PUFFS EVERY 4-6 HOURS AS NEEDED FOR COUGH OR WHEEZE. 11/10/23   Renee Cross, Renee Philips, Renee Cross  Albuterol-Budesonide (AIRSUPRA) 90-80 MCG/ACT AERO Inhale 2 puffs into the lungs every 4 (four) hours as needed (cough, wheezing). 10/20/23   Renee Gunner, Renee Cross  benzonatate (TESSALON) 200 MG capsule Take 1 capsule (200 mg total) by mouth 2 (two) times daily as needed for cough. 08/07/23   Renee Gunner, Renee Cross  bupivacaine, PF, (MARCAINE) 0.25 % SOLN injection  10/11/23   Renee Cross, Historical, Renee Cross  cetirizine (ZYRTEC) 10 MG tablet Take 1 tablet (10 mg total) by mouth daily. 1 po q day prn allergies 06/15/15   Renee Porter, Renee Cross  cyanocobalamin (VITAMIN B12) 1000 MCG tablet Take 1 tablet (1,000 mcg total) by mouth daily. 01/12/23 01/07/24  Renee Gunner, Renee Cross  DULoxetine (CYMBALTA) 60 MG capsule Take 1 capsule (60 mg total) by mouth daily. 10/10/23 12/09/23  Renee Cross, Renee Grout, Renee Cross  esomeprazole (NEXIUM) 40 MG capsule Take 1 capsule (40 mg total) by mouth every morning. 09/05/23   Renee Cross, Renee Philips, Renee Cross  famotidine (PEPCID) 40 MG tablet Take 1 tablet (40 mg total) by mouth at bedtime. 09/05/23   Renee Cross, Renee Philips, Renee Cross  ferrous sulfate 325 (65 FE) MG EC tablet Take 325 mg by mouth daily.    Renee Cross, Historical, Renee Cross  fluticasone (FLONASE) 50 MCG/ACT nasal spray Place 2 sprays into both nostrils every morning. 2 sprays per nostril every day for stuffy nose or drainage. 09/05/23   Renee Cross, Renee Philips, Renee Cross  ipratropium-albuterol (DUONEB) 0.5-2.5 (3) MG/3ML SOLN Take 3 mLs by nebulization every 4 (four)  hours as needed. 10/20/23 01/18/24  Renee Gunner, Renee Cross  IRON, FERROUS GLUCONATE, PO Take by mouth.    Renee Cross, Historical, Renee Cross  loratadine (CLARITIN) 10 MG tablet Take 1 tablet (10 mg total) by mouth 2 (two) times daily. 09/05/23   Renee Cross, Renee Philips, Renee Cross  medroxyPROGESTERone (PROVERA) 10 MG tablet Take 10 mg by mouth daily. 09/06/23   Renee Cross, Historical, Renee Cross  meloxicam (MOBIC) 15 MG tablet Take 1 tablet (15 mg total) by mouth daily. For 2 weeks for pain and inflammation. Then take as needed 02/14/23   Renee Cross, Renee Kief, PA-C  methocarbamol (ROBAXIN) 500 MG tablet Take 1 tablet (500 mg total) by mouth every 8 (eight) hours as needed for muscle spasms. 08/25/23   Renee Gunner, Renee Cross  mometasone (ELOCON) 0.1 % ointment Apply topically daily. Apply once daily to red itchy areas or until resolved 09/05/23   Renee Cross, Renee Philips, Renee Cross  mometasone-formoterol (DULERA) 200-5 MCG/ACT AERO Inhale 2 puffs into the lungs  2 (two) times daily. 11/07/23   Renee Cross, Renee Philips, Renee Cross  montelukast (SINGULAIR) 10 MG tablet TAKE 1 TABLET BY MOUTH EVERYDAY AT BEDTIME 11/06/23   Renee Gunner, Renee Cross  Multiple Vitamin (MULTIVITAMIN WITH MINERALS) TABS tablet Take 1 tablet by mouth daily.    Renee Cross, Historical, Renee Cross  Olmesartan-amLODIPine-HCTZ 40-10-25 MG TABS Take 1 tablet by mouth daily. 06/27/23 06/21/24  Renee Gunner, Renee Cross  Olopatadine HCl (PATADAY) 0.2 % SOLN Place 1 drop into both eyes daily as needed (allergies). 02/21/23   Renee Cross, Renee Philips, Renee Cross  promethazine-dextromethorphan (PROMETHAZINE-DM) 6.25-15 MG/5ML syrup Take 5 mLs by mouth 4 (four) times daily as needed. 10/20/23   Renee Gunner, Renee Cross  QUEtiapine (SEROQUEL) 50 MG tablet Take 1 tablet (50 mg total) by mouth at bedtime. 10/10/23 12/09/23  Renee Cross, Renee Grout, Renee Cross  sennosides-docusate sodium (SENOKOT-S) 8.6-50 MG tablet Take 2 tablets by mouth daily. To help prevent constipation while taking pain medication 02/14/23   Renee Cross, Renee Kief, PA-C  tezepelumab-ekko (TEZSPIRE) 210 MG/1. syringe  Inject 1.91 mLs (210 mg total) into the skin every 28 (twenty-eight) days. 10/23/23   Renee Cross, Renee Philips, Renee Cross  triamcinolone acetonide [facility]) 40 MG/ML injection  10/11/23   Renee Cross, Historical, Renee Cross    Physical Exam    Vital Signs:  Lovelyn Sheeran does not have vital signs available for review today.  Given telephonic nature of communication, physical exam is limited. AAOx3. NAD. Normal affect.  Speech and respirations are unlabored.  Accessory Clinical Findings    None  Assessment & Plan    1.  Preoperative Cardiovascular Risk Assessment:  = Ms. Bewick's perioperative risk of a major cardiac event is 0.4% according to the Revised Cardiac Risk Index (RCRI).  Therefore, she is at low risk for perioperative complications.   Her functional capacity is poor at 3.97 METs according to the [facility] Activity Status Index (DASI). Recommendations: The patient requires a Lexiscan Myoview before a disposition can be made regarding surgical risk.   The patient was advised that if she develops new symptoms prior to surgery to contact our office to arrange for a follow-up visit, and she verbalized understanding.   A copy of this note will be routed to requesting surgeon.  Time:   Today, I have spent 6 minutes with the patient with telehealth technology discussing medical history, symptoms, and management plan.     Sharlene Dory, PA-C  12/28/2023, 1:39 PM

## 2023-12-29 ENCOUNTER — Ambulatory Visit: Admitting: Family Medicine

## 2023-12-31 ENCOUNTER — Other Ambulatory Visit (HOSPITAL_COMMUNITY): Payer: Self-pay | Admitting: Psychiatry

## 2023-12-31 DIAGNOSIS — F1010 Alcohol abuse, uncomplicated: Secondary | ICD-10-CM

## 2023-12-31 DIAGNOSIS — F4312 Post-traumatic stress disorder, chronic: Secondary | ICD-10-CM

## 2023-12-31 DIAGNOSIS — F1210 Cannabis abuse, uncomplicated: Secondary | ICD-10-CM

## 2023-12-31 DIAGNOSIS — F331 Major depressive disorder, recurrent, moderate: Secondary | ICD-10-CM

## 2024-01-01 ENCOUNTER — Ambulatory Visit (HOSPITAL_COMMUNITY)

## 2024-01-02 ENCOUNTER — Encounter (HOSPITAL_COMMUNITY): Payer: Self-pay

## 2024-01-02 ENCOUNTER — Ambulatory Visit: Admitting: Family Medicine

## 2024-01-02 ENCOUNTER — Ambulatory Visit (HOSPITAL_COMMUNITY)

## 2024-01-03 ENCOUNTER — Telehealth: Payer: Self-pay

## 2024-01-03 ENCOUNTER — Encounter (HOSPITAL_COMMUNITY): Payer: Self-pay | Admitting: Psychiatry

## 2024-01-03 ENCOUNTER — Encounter (HOSPITAL_COMMUNITY)

## 2024-01-03 ENCOUNTER — Encounter: Payer: Self-pay | Admitting: Family Medicine

## 2024-01-03 ENCOUNTER — Telehealth (HOSPITAL_BASED_OUTPATIENT_CLINIC_OR_DEPARTMENT_OTHER): Admitting: Psychiatry

## 2024-01-03 ENCOUNTER — Ambulatory Visit (INDEPENDENT_AMBULATORY_CARE_PROVIDER_SITE_OTHER): Admitting: Family Medicine

## 2024-01-03 VITALS — Wt 209.0 lb

## 2024-01-03 VITALS — BP 140/70 | HR 96 | Temp 98.5°F | Wt 208.4 lb

## 2024-01-03 DIAGNOSIS — D500 Iron deficiency anemia secondary to blood loss (chronic): Secondary | ICD-10-CM

## 2024-01-03 DIAGNOSIS — F331 Major depressive disorder, recurrent, moderate: Secondary | ICD-10-CM

## 2024-01-03 DIAGNOSIS — M0579 Rheumatoid arthritis with rheumatoid factor of multiple sites without organ or systems involvement: Secondary | ICD-10-CM | POA: Diagnosis not present

## 2024-01-03 DIAGNOSIS — F1010 Alcohol abuse, uncomplicated: Secondary | ICD-10-CM

## 2024-01-03 DIAGNOSIS — E66811 Obesity, class 1: Secondary | ICD-10-CM

## 2024-01-03 DIAGNOSIS — J4550 Severe persistent asthma, uncomplicated: Secondary | ICD-10-CM | POA: Diagnosis not present

## 2024-01-03 DIAGNOSIS — Z01818 Encounter for other preprocedural examination: Secondary | ICD-10-CM

## 2024-01-03 DIAGNOSIS — Z6833 Body mass index (BMI) 33.0-33.9, adult: Secondary | ICD-10-CM | POA: Diagnosis not present

## 2024-01-03 DIAGNOSIS — I10 Essential (primary) hypertension: Secondary | ICD-10-CM

## 2024-01-03 DIAGNOSIS — F4312 Post-traumatic stress disorder, chronic: Secondary | ICD-10-CM | POA: Diagnosis not present

## 2024-01-03 DIAGNOSIS — F1210 Cannabis abuse, uncomplicated: Secondary | ICD-10-CM | POA: Diagnosis not present

## 2024-01-03 DIAGNOSIS — R197 Diarrhea, unspecified: Secondary | ICD-10-CM | POA: Diagnosis not present

## 2024-01-03 DIAGNOSIS — E6609 Other obesity due to excess calories: Secondary | ICD-10-CM | POA: Diagnosis not present

## 2024-01-03 LAB — POCT INFLUENZA A/B
Influenza A, POC: NEGATIVE
Influenza B, POC: NEGATIVE

## 2024-01-03 LAB — POC COVID19 BINAXNOW: SARS Coronavirus 2 Ag: NEGATIVE

## 2024-01-03 MED ORDER — DULOXETINE HCL 60 MG PO CPEP: 60.0000 mg | ORAL_CAPSULE | Freq: Every day | ORAL | 2 refills | Status: AC

## 2024-01-03 MED ORDER — QUETIAPINE FUMARATE 100 MG PO TABS
100.0000 mg | ORAL_TABLET | Freq: Every day | ORAL | 2 refills | Status: AC
Start: 2024-01-03 — End: 2024-04-04

## 2024-01-03 MED ORDER — WEGOVY 0.5 MG/0.5ML ~~LOC~~ SOAJ: 0.5000 mg | SUBCUTANEOUS | 0 refills | Status: AC

## 2024-01-03 NOTE — Progress Notes (Signed)
 Assessment/Plan:    Assessment & Plan Preoperative Clearance for Hip Surgery Renee Cross is being evaluated for preoperative clearance for hip surgery. She has been seen by cardiology and pulmonology for clearance. Her recent blood work in January showed normal blood counts, and her kidney function was last checked in November 2022, which was also normal. She is considered medically optimized from the family medicine perspective, but further clearance from cardiology and pulmonology is pending. Discussed the importance of monitoring for acute bleeding and heart disease as potential complications during surgery. - Order repeat metabolic panel and CBC to ensure stability before surgery. - Ensure cardiology and pulmonology provide clearance for surgery. - Order A1c as requested by the surgical team.  Iron Deficiency Anemia Renee Cross's anemia is currently well-managed, with normal blood counts in January.  Hypertension Renee Cross's blood pressure was slightly elevated at 140/70 mmHg. She is on olmesartan, amlodipine, and hydrochlorothiazide. Her current illness might be affecting her blood pressure readings. - Continue current antihypertensive regimen: olmesartan, amlodipine, hydrochlorothiazide 40/10/25 mg daily. - Recheck blood pressure during follow-up in three months.  Obesity Renee Cross has a BMI of 33.6 and is being treated with [facility] for weight management. She was started on a low dose of 0.25 mg weekly to assess for side effects. Advised against starting a higher dose while experiencing gastrointestinal symptoms and before surgery, as it could exacerbate nausea. Renee Cross should be stopped one to two weeks before surgery. - Increase Wegovy dose to 0.5 mg weekly after recovery from current illness and post-surgery. - Administer Wegovy injection in the abdomen or any area with sufficient subcutaneous tissue. - Schedule follow-up for weight management in three months.  Asthma Renee Cross is being  followed by pulmonology for asthma management as part of her preoperative clearance. No acute issues were discussed during this visit.  General Health Maintenance Discussed the importance of routine lab work and screenings to ensure overall health stability, especially in preparation for surgery. - Order fasting lab work in a week to ensure proper hydration and health status before surgery.      There are no discontinued medications.  Return in about 3 months (around 04/03/2024) for fasting labs in 7 days, weight management.    Subjective:   Encounter date: 01/03/2024  Renee Cross is a 48 y.o. female who has Amblyopia of left eye; Anemia; Rheumatoid arthritis (HCC); Abnormal uterine bleeding (AUB); Severe persistent asthma with exacerbation; Hip arthritis; Gastroesophageal reflux disease; Perennial allergic rhinitis with seasonal variation; Encounter for disability assessment; Overuse of medication; Primary hypertension; Alcohol use disorder; Financial difficulty; Episode of recurrent major depressive disorder (HCC); B12 deficiency; History of suicide attempt; Non-recurrent acute suppurative otitis media of left ear without spontaneous rupture of tympanic membrane; Tobacco use disorder; Bilateral knee pain; Osteoarthritis of right knee; MDD (major depressive disorder), recurrent episode, moderate (HCC); Class 1 obesity due to excess calories with serious comorbidity and body mass index (BMI) of 33.0 to 33.9 in adult; Screening for colon cancer; Arthritis; Depression; Hiatal hernia with GERD; Hypertension; Pneumonia; PTSD (post-traumatic stress disorder); Sickle cell trait (HCC); Acute recurrent sinusitis; Asthma; Elevated rheumatoid factor; Iron deficiency anemia due to chronic blood loss; Left groin mass; Microcytic hypochromic anemia; Multiple allergies; Nasal polyp; Osteoarthritis of both hips; and Primary osteoarthritis of both knees on their problem list..   She  has a past medical history  of Abnormal uterine bleeding (AUB) (12/22/2022), Acute recurrent sinusitis (01/27/2021), Alcohol use disorder (01/14/2023), Amblyopia of left eye (12/22/2022), Anemia, Arthritis, Asthma (09/12/2023), B12 deficiency (01/14/2023), Bilateral knee  pain (06/28/2023), Class 1 drug-induced obesity with serious comorbidity and body mass index (BMI) of 33.0 to 33.9 in adult (08/26/2023), Depression, Elevated rheumatoid factor (12/28/2021), Encounter for disability assessment (12/22/2022), Episode of recurrent major depressive disorder (HCC) (01/14/2023), Financial difficulty (01/14/2023), Gastroesophageal reflux disease (12/22/2022), Hiatal hernia with GERD, Hip arthritis (12/22/2022), History of suicide attempt (01/14/2023), Hypertension, Iron deficiency anemia due to chronic blood loss (03/31/2022), Left groin mass (09/17/2020), MDD (major depressive disorder), recurrent episode, moderate (HCC) (08/23/2023), Microcytic hypochromic anemia (12/28/2021), Multiple allergies (09/12/2023), Nasal polyp (01/27/2021), Non-recurrent acute suppurative otitis media of left ear without spontaneous rupture of tympanic membrane (05/16/2023), Osteoarthritis of both hips (01/21/2022), Osteoarthritis of right knee (07/02/2023), Overuse of medication (12/22/2022), Perennial allergic rhinitis with seasonal variation (12/22/2022), Pneumonia, Primary hypertension (12/22/2022), Primary osteoarthritis of both knees (01/21/2022), PTSD (post-traumatic stress disorder), Rheumatoid arthritis (HCC) (12/22/2022), Screening for colon cancer (08/26/2023), Severe persistent asthma with exacerbation (12/22/2022), Sickle cell trait (HCC), and Tobacco use disorder (05/16/2023)..   She presents with chief complaint of Emesis (Vomiting, aches and diarrhea x Monday. Pt Also has preoperative form.) .   Discussed the use of AI scribe software for clinical note transcription with the patient, who gave verbal consent to proceed.  History of Present  Illness Renee Cross is a 48 year old female with iron deficiency anemia and obesity who presents for preoperative clearance for hip surgery.  Her blood pressure is managed with olmesartan, amlodipine, and hydrochlorothiazide. Recent blood work in January showed normal blood counts, and kidney function was normal in November 2022. No recent cholesterol tests have been done, with the last lipid panel about a year ago showing good results.  She has experienced recent symptoms of nausea and diarrhea, which have improved, though she still feels 'a little woozy.' She is tolerating fluids, primarily drinking water, and has not taken any medication for nausea. No chest pain or shortness of breath. COVID and flu tests were negative. She feels weak and has a sore abdominal wall from vomiting.  She has a history of iron deficiency anemia, with her last blood count in January showing normal results, indicating that her anemia is currently well-managed.  Her blood pressure was slightly elevated at 140/70. She is currently on olmesartan, amlodipine, and hydrochlorothiazide (40 mg, 10 mg, 25 mg daily) for blood pressure management.  She has a history of obesity with a BMI of 33.6 and has been on Wegovy 0.25 mg weekly for weight management. She tolerates the medication well, though she is advised not to start it when feeling unwell due to potential exacerbation of nausea.     ROS  Past Surgical History:  Procedure Laterality Date   HERNIA REPAIR     SINUS EXPLORATION     STOMACH SURGERY  10/03/2001   Possible fundiplication?   TONSILLECTOMY     TOTAL HIP ARTHROPLASTY Right 02/14/2023   Procedure: TOTAL HIP ARTHROPLASTY ANTERIOR APPROACH;  Surgeon: Sheral Apley, MD;  Location: [facility];  Service: Orthopedics;  Laterality: Right;   TUBAL LIGATION      Outpatient Medications Prior to Visit  Medication Sig Dispense Refill   albuterol (VENTOLIN HFA) 108 (90 Base) MCG/ACT inhaler 2 PUFFS EVERY 4-6  HOURS AS NEEDED FOR COUGH OR WHEEZE. 18 each 1   Albuterol-Budesonide (AIRSUPRA) 90-80 MCG/ACT AERO Inhale 2 puffs into the lungs every 4 (four) hours as needed (cough, wheezing). 10.7 g 3   benzonatate (TESSALON) 200 MG capsule Take 1 capsule (200 mg total) by mouth 2 (two) times daily as  needed for cough. 20 capsule 0   bupivacaine, PF, (MARCAINE) 0.25 % SOLN injection      cetirizine (ZYRTEC) 10 MG tablet Take 1 tablet (10 mg total) by mouth daily. 1 po q day prn allergies 30 tablet 0   cyanocobalamin (VITAMIN B12) 1000 MCG tablet Take 1 tablet (1,000 mcg total) by mouth daily. 90 tablet 3   DULoxetine (CYMBALTA) 60 MG capsule Take 1 capsule (60 mg total) by mouth daily. 30 capsule 2   esomeprazole (NEXIUM) 40 MG capsule Take 1 capsule (40 mg total) by mouth every morning. 30 capsule 5   famotidine (PEPCID) 40 MG tablet Take 1 tablet (40 mg total) by mouth at bedtime. 30 tablet 5   ferrous sulfate 325 (65 FE) MG EC tablet Take 325 mg by mouth daily.     fluticasone (FLONASE) 50 MCG/ACT nasal spray Place 2 sprays into both nostrils every morning. 2 sprays per nostril every day for stuffy nose or drainage. 16 g 5   ipratropium-albuterol (DUONEB) 0.5-2.5 (3) MG/3ML SOLN Take 3 mLs by nebulization every 4 (four) hours as needed. 360 mL 0   IRON, FERROUS GLUCONATE, PO Take by mouth.     loratadine (CLARITIN) 10 MG tablet Take 1 tablet (10 mg total) by mouth 2 (two) times daily. 180 tablet 1   medroxyPROGESTERone (PROVERA) 10 MG tablet Take 10 mg by mouth daily.     meloxicam (MOBIC) 15 MG tablet Take 1 tablet (15 mg total) by mouth daily. For 2 weeks for pain and inflammation. Then take as needed 30 tablet 0   methocarbamol (ROBAXIN) 500 MG tablet Take 1 tablet (500 mg total) by mouth every 8 (eight) hours as needed for muscle spasms. 30 tablet 0   mometasone (ELOCON) 0.1 % ointment Apply topically daily. Apply once daily to red itchy areas or until resolved 135 g 1   mometasone-formoterol (DULERA)  200-5 MCG/ACT AERO Inhale 2 puffs into the lungs 2 (two) times daily. 1 each 5   montelukast (SINGULAIR) 10 MG tablet TAKE 1 TABLET BY MOUTH EVERYDAY AT BEDTIME 30 tablet 3   Multiple Vitamin (MULTIVITAMIN WITH MINERALS) TABS tablet Take 1 tablet by mouth daily.     Olmesartan-amLODIPine-HCTZ 40-10-25 MG TABS Take 1 tablet by mouth daily. 90 tablet 3   Olopatadine HCl (PATADAY) 0.2 % SOLN Place 1 drop into both eyes daily as needed (allergies). 2.5 mL 3   promethazine-dextromethorphan (PROMETHAZINE-DM) 6.25-15 MG/5ML syrup Take 5 mLs by mouth 4 (four) times daily as needed. 118 mL 0   QUEtiapine (SEROQUEL) 100 MG tablet Take 1 tablet (100 mg total) by mouth at bedtime. 30 tablet 2   sennosides-docusate sodium (SENOKOT-S) 8.6-50 MG tablet Take 2 tablets by mouth daily. To help prevent constipation while taking pain medication 30 tablet 0   tezepelumab-ekko (TEZSPIRE) 210 MG/1. syringe Inject 1.91 mLs (210 mg total) into the skin every 28 (twenty-eight) days. 1.91 mL 11   triamcinolone acetonide (KENALOG-40) 40 MG/ML injection      No facility-administered medications prior to visit.    Family History  Problem Relation Age of Onset   Hypertension Mother    Cancer Maternal Aunt    Heart disease Neg Hx     Social History   Socioeconomic History   Marital status: Single    Spouse name: Not on file   Number of children: Not on file   Years of education: Not on file   Highest education level: Not on file  Occupational History  Not on file  Tobacco Use   Smoking status: Former    Types: Cigars    Passive exposure: Never   Smokeless tobacco: Never   Tobacco comments:    Smokes black and mild occassionally  Vaping Use   Vaping status: Never Used  Substance and Sexual Activity   Alcohol use: Yes    Alcohol/week: 2.0 standard drinks of alcohol    Types: 2 Cans of beer per week   Drug use: Yes    Frequency: 4.0 times per week    Types: Marijuana    Comment: edibles   Sexual  activity: Yes    Birth control/protection: Surgical  Other Topics Concern   Not on file  Social History Narrative   Caffiene 2 days / week 2 cans soda   Works not working.    Social Drivers of Corporate investment banker Strain: Not on file  Food Insecurity: Low Risk  (09/28/2023)   Received from [facility]   Hunger Vital Sign    Worried About Running Out of Food in the Last Year: Never true    Ran Out of Food in the Last Year: Never true  Transportation Needs: No Transportation Needs (09/28/2023)   Received from [facility]    In the past 12 months, has lack of reliable transportation kept you from medical appointments, meetings, work or from getting things needed for daily living? : No  Physical Activity: Not on file  Stress: Not on file  Social Connections: Not on file  Intimate Partner Violence: Not on file                                                                                                  Objective:  Physical Exam: BP (!) 140/70   Pulse 96   Temp 98.5 F (36.9 C) (Oral)   Wt 208 lb 6.4 oz (94.5 kg)   SpO2 97%   BMI 33.64 kg/m    Physical Exam VITALS: BP- 140/70 MEASUREMENTS: BMI- 33.6.   Physical Exam  Nocturnal polysomnography Result Date: 10/16/2023 Huston Foley, MD     10/20/2023 11:45 AM Physician Interpretation:  [facility] POLYSOMNOGRAPHY  INTERPRETATION REPORT STUDY DATE:  10/16/2023  PATIENT NAME:  Renee Cross        DATE OF BIRTH:  02/20/1976 PATIENT ID:  [id number]    TYPE OF STUDY:  PSG READING PHYSICIAN: Huston Foley, MD, PhD    SCORING TECHNICIAN: Margaretann Loveless, RPSGT Referred by: Garnette Gunner, MD ? History and Indication for Testing: 48 year old female with an underlying medical history of hypertension, reflux disease, allergic rhinitis, smoking, asthma, anemia, sickle cell trait, PTSD (by chart review) and mild obesity, who reports snoring and excessive daytime somnolence, as  well as witnessed apneic pauses per daughter's report. Her Epworth sleepiness score is 15 out of 24, fatigue severity score is 53 out of 63.  Height: 66 in Weight: 197 lb (BMI 31) Neck Size: 15 in  MEDICATIONS: Proventil, Ventolin, Zyrtec, Vitamin B12, Cymbalta, Nexium, Ferrous Sulfate, Flonase, Trelegy Ellipta, Duoneb, Iron, Claritin, Probera,  Mobic, Robaxin, Elocon, Singulair, Multivitamin, Olmesartan, Olopatadine, Prolosec, Promethazine-DM, Seroquel, Senokot-S  TECHNICAL DESCRIPTION: A registered sleep technologist  was in attendance for the duration of the recording.  Data collection, scoring, video monitoring, and reporting were performed in compliance with the AASM Manual for the Scoring of Sleep and Associated Events; (Hypopnea is scored based on the criteria listed in Section VIII D. 1b in the AASM Manual V2.6 using a 4% oxygen desaturation rule or Hypopnea is scored based on the criteria listed in Section VIII D. 1a in the AASM Manual V2.6 using 3% oxygen desaturation and /or arousal rule). SLEEP CONTINUITY AND SLEEP ARCHITECTURE:  Lights-out was at 21:10: and lights-on at  05:04:, with a total recording time of 7 hours, 54.5 min. Total sleep time ( TST) was 351.0 minutes with a decreased sleep efficiency at 74.0%. There was  13.8% REM sleep.  BODY POSITION:  TST was divided  between the following sleep positions: 31.2% supine;  68.8% lateral;  0% prone. Duration of total sleep and percent of total sleep in their respective position is as follows: supine 109 minutes (31%), non-supine 242 minutes (69%); right 58 minutes (17%), left 183 minutes (52%), and prone 00 minutes (0%). Total supine REM sleep time was 00 minutes (0% of total REM sleep).  Sleep latency was mildly increased at 26.0 minutes.  REM sleep latency was increased at 163.5 minutes. Of the total sleep time, the percentage of stage N1 sleep was 14.7%, which is increased, stage N2 sleep was 66%, which is increased, stage N3 sleep was 5.1%, which  is reduced, and REM sleep was 13.8%, which is reduced. Wake after sleep onset (WASO) time accounted for 97.5 minutes with sleep fragmentation noted, ranging from minimal to moderate. RESPIRATORY MONITORING:  Based on CMS criteria (using a 4% oxygen desaturation rule for scoring hypopneas), there were 0 apneas (0 obstructive; 0 central; 0 mixed), and 5 hypopneas.  Apnea index was 0.0. Hypopnea index was 0.9. The apnea-hypopnea index was 0.9/hour overall (0.5 supine, 0 non-supine; 0.0 REM, 0.0 supine REM).  There were 0 respiratory effort-related arousals (RERAs).  The RERA index was 0 events/h. Total respiratory disturbance index (RDI) was 0.9 events/h. RDI results showed: supine RDI  0.5 /h; non-supine RDI 1.0 /h; REM RDI 0.0 /h, supine REM RDI 0.0 /h. Based on AASM criteria (using a 3% oxygen desaturation and /or arousal rule for scoring hypopneas), there were 0 apneas (0 obstructive; 0 central; 0 mixed), and 5 hypopneas. Apnea index was 0.0. Hypopnea index was 0.9. The apnea-hypopnea index was 0.9 overall (0.5 supine, 0 non-supine; 0.0 REM, 0.0 supine REM).  There were 0 respiratory effort-related arousals (RERAs).  The RERA index was 0 events/h. Total respiratory disturbance index (RDI) was 0.9 events/h. RDI results showed: supine RDI  0.5 /h; non-supine RDI 1.0 /h; REM RDI 0.0 /h, supine REM RDI 0.0 /h.  OXIMETRY: Oxyhemoglobin Saturation Nadir during sleep was at  91% from a mean of 96%.  Of the Total sleep time (TST)   hypoxemia (=<88%) was present for  0.0 minutes, or 0.0% of total sleep time.  LIMB MOVEMENTS: There were 4 periodic limb movements of sleep (0.7/hr), of which 0 (0.0/hr) were associated with an arousal.  AROUSAL: There were 73 arousals in total, for an arousal index of 12 arousals/hour.  Of these, 5 were identified as respiratory-related arousals (1 /h), 0 were PLM-related arousals (0 /h), and 92 were non-specific arousals (16 /h).  EEG: Review of the EEG showed no abnormal electrical  discharges  and symmetrical bihemispheric findings.  EKG: The EKG revealed normal sinus rhythm (NSR). The average heart rate during sleep was 64 bpm. AUDIO/VIDEO REVIEW: The audio and video review did not show any abnormal or unusual behaviors, movements, phonations or vocalizations. The patient took 2 restroom breaks. Snoring was noted, in the mild range. POST-STUDY QUESTIONNAIRE: Post study, the patient indicated, that sleep was better than usual. IMPRESSION: 1. Primary Snoring 2. Dysfunctions associated with sleep stages or arousal from sleep  RECOMMENDATIONS: 1. This study does not demonstrate any significant obstructive or central sleep disordered breathing with an AHI of less than 5/hour - her AHI was 0.85/hour - and oxygen desaturation nadir was 91% for the night. Mild intermittent snoring was noted. Treatment with a positive airway pressure device, such as CPAP or autoPAP is not indicated. Some degree of weight loss and avoidance of the supine sleep position may aid in reducing her snoring.  2. This study shows sleep fragmentation and abnormal sleep stage percentages; these are nonspecific findings and per se do not signify an intrinsic sleep disorder or a cause for the patient's sleep-related symptoms. Causes include (but are not limited to) the first night effect of the sleep study, circadian rhythm disturbances, medication effect or an underlying mood disorder or medical problem. 3. The patient should be cautioned not to drive, work at heights, or operate dangerous or heavy equipment when tired or sleepy. Review and reiteration of good sleep hygiene measures should be pursued with any patient. 4. The patient will be advised to follow up with the referring provider, who will be notified of the test results. I certify that I have reviewed the entire raw data recording prior to the issuance of this report in accordance with the Standards of Accreditation of the American Academy of Sleep Medicine (AASM). Huston Foley, MD,  PhD Medical Director, [facility]) Diplomat, ABPN (Neurology and Sleep)   Technical Report: General Information Name: Renee Cross, Renee Cross BMI: 31.80 Physician: Huston Foley, MD ID: [id number] Height: 66.0 in Technician: Margaretann Loveless, RPSGT Sex: Female Weight: 197.0 lb Record: [id number] Age: 61 [1976-09-30] Date: 10/16/2023   Medical & Medication History   48 year old female with an underlying medical history of hypertension, reflux disease, allergic rhinitis, smoking, asthma, anemia, sickle cell trait, PTSD (by chart review) and mild obesity, who reports snoring and excessive daytime somnolence, as well as witnessed apneic pauses per daughter's report. Proventil, Ventolin, Zyrtec, Vitamin B12, Cymbalta, Nexium, Ferrous Sulfate, Flonase, Trelegy Ellipta, Duoneb, Iron, Claritin, Probera, Mobic, Robaxin, Elocon, Singulair, Multivitamin, Olmesartan, Olopatadine, Prolosec, Promethazine-DM, Seroquel, Senokot-S  Sleep Disorder    Comments  The patient came into the sleep lab for a PSG. Per the patient she took Cymbalta, Robaxin, Hydrocodone and Seroquel prior to arrival. Two restroom breaks. EKG did not show any obvious cardiac arrhythmias. Mild snoring. Respiratory events scored with a 4% desat. Spontaneous arousals noted. PLM's during REM. The patient slept lateral and supine. All sleep stages observed.   Lights out: 09:10:00 PM Lights on: 05:04:32 AM Time Total Supine Side Prone Upright Recording (TRT) 7h 54.5m 2h 59.44m 4h 55.45m 0h 0.74m 0h 0.59m Sleep (TST) 5h 51.36m 1h 49.61m 4h 1.66m 0h 0.55m 0h 0.24m Latency N1 N2 N3 REM Onset Per. Slp. Eff. Actual 0h 0.42m 0h 11.89m 5h 49.4m 2h 43.17m 0h 26.62m 0h 35.71m 73.97% Stg Dur Wake N1 N2 N3 REM Total 123.5 51.5 233.0 18.0 48.5 Supine 69.5 17.5 74.0 18.0 0.0 Side 54.0 34.0 159.0 0.0 48.5 Prone 0.0  0.0 0.0 0.0 0.0 Upright 0.0 0.0 0.0 0.0 0.0  Stg % Wake N1 N2 N3 REM Total 26.0 14.7 66.4 5.1 13.8 Supine 14.6 5.0 21.1 5.1 0.0 Side 11.4 9.7 45.3 0.0  13.8 Prone 0.0 0.0 0.0 0.0 0.0 Upright 0.0 0.0 0.0 0.0 0.0  Apnea Summary Sub Supine Side Prone Upright Total 0 Total 0 0 0 0 0   REM 0 0 0 0 0   NREM 0 0 0 0 0 Obs 0 REM 0 0 0 0 0   NREM 0 0 0 0 0 Mix 0 REM 0 0 0 0 0   NREM 0 0 0 0 0 Cen 0 REM 0 0 0 0 0   NREM 0 0 0 0 0 Rera Summary Sub Supine Side Prone Upright Total 0 Total 0 0 0 0 0   REM 0 0 0 0 0   NREM 0 0 0 0 0  Hypopnea Summary Sub Supine Side Prone Upright Total 5 Total 5 1 4  0 0   REM 0 0 0 0 0   NREM 5 1 4  0 0 4% Hypopnea Summary Sub Supine Side Prone Upright Total (4%) 5 Total 5 1 4  0 0   REM 0 0 0 0 0   NREM 5 1 4  0 0  AHI Total Obs Mix Cen 0.85 Apnea 0.00 0.00 0.00 0.00  Hypopnea 0.85 -- -- -- 0.85 Hypopnea (4%) 0.85 -- -- --  Total Supine Side Prone Upright Position AHI 0.85 0.55 0.99 0.00 0.00 REM AHI 0.00  NREM AHI 0.99  Position RDI 0.85 0.55 0.99 0.00 0.00 REM RDI 0.00  NREM RDI 0.99  4% Hypopnea Total Supine Side Prone Upright Position AHI (4%) 0.85 0.55 0.99 0.00 0.00 REM AHI (4%) 0.00  NREM AHI (4%) 0.99  Position RDI (4%) 0.85 0.55 0.99 0.00 0.00 REM RDI (4%) 0.00  NREM RDI (4%) 0.99  Desaturation Information Threshold: 2% <100% <90% <80% <70% <60% <50% <40% Supine 34.0 0.0 0.0 0.0 0.0 0.0 0.0 Side 76.0 2.0 0.0 0.0 0.0 0.0 0.0 Prone 0.0 0.0 0.0 0.0 0.0 0.0 0.0 Upright 0.0 0.0 0.0 0.0 0.0 0.0 0.0 Total 110.0 2.0 0.0 0.0 0.0 0.0 0.0 Index 14.7 0.3 0.0 0.0 0.0 0.0 0.0 Threshold: 3% <100% <90% <80% <70% <60% <50% <40% Supine 21.0 0.0 0.0 0.0 0.0 0.0 0.0 Side 23.0 1.0 0.0 0.0 0.0 0.0 0.0 Prone 0.0 0.0 0.0 0.0 0.0 0.0 0.0 Upright 0.0 0.0 0.0 0.0 0.0 0.0 0.0 Total 44.0 1.0 0.0 0.0 0.0 0.0 0.0 Index 5.9 0.1 0.0 0.0 0.0 0.0 0.0 Threshold: 4% <100% <90% <80% <70% <60% <50% <40% Supine 7.0 0.0 0.0 0.0 0.0 0.0 0.0 Side 12.0 1.0 0.0 0.0 0.0 0.0 0.0 Prone 0.0 0.0 0.0 0.0 0.0 0.0 0.0 Upright 0.0 0.0 0.0 0.0 0.0 0.0 0.0 Total 19.0 1.0 0.0 0.0 0.0 0.0 0.0 Index 2.5 0.1 0.0 0.0 0.0 0.0 0.0 Threshold: 3% <100% <90% <80% <70% <60% <50% <40% Supine 21 0 0 0 0 0 0  Side 23 1 0 0 0 0 0 Prone 0 0 0 0 0 0 0 Upright 0 0 0 0 0 0 0 Total 44 1 0 0 0 0 0  Awakening/Arousal Information # of Awakenings 45 Wake after sleep onset 97.63m Wake after persistent sleep 92.36m Arousal Assoc. Arousals Index Apneas 0 0.0 Hypopneas 5 0.9 Leg Movements 4 0.7 Snore 0 0.0 PTT Arousals 0 0.0 Spontaneous 92 15.7 Total 101 17.3 Leg Movement Information PLMS  LMs Index Total LMs during PLMS 4 0.7 LMs w/ Microarousals 0 0.0 LM LMs Index w/ Microarousal 4 0.7 w/ Awakening 1 0.2 w/ Resp Event 0 0.0 Spontaneous 8 1.4 Total 12 2.1  Desaturation threshold setting: 3% Minimum desaturation setting: 10 seconds SaO2 nadir: 88% The longest event was a 32 sec obstructive Hypopnea with a minimum SaO2 of 91%. The lowest SaO2 was 89% associated with a 17 sec obstructive Hypopnea. EKG Rates EKG Avg Max Min Awake 68 102 59 Asleep 64 79 56 EKG Events: Tachycardia  ECHOCARDIOGRAM COMPLETE Result Date: 10/10/2023    ECHOCARDIOGRAM REPORT   Patient Name:   Renee Cross Date of Exam: 10/10/2023 Medical Rec #:  [id number]     Height:       66.0 in Accession #:    [id number]    Weight:       205.0 lb Date of Birth:  02/03/1976     BSA:          2.021 m Patient Age:    47 years      BP:           146/76 mmHg Patient Gender: F             HR:           72 bpm. Exam Location:  [place] Procedure: 2D Echo, Cardiac Doppler and Color Doppler Indications:    R07.9* Chest pain, unspecified  History:        Patient has no prior history of Echocardiogram examinations.                 Anemia, Signs/Symptoms:Chest Pain; Risk Factors:Hypertension and                 Former Smoker.  Sonographer:    Jake Seats RDMS, RVT, RDCS Referring Phys: Rito Ehrlich [facility] IMPRESSIONS  1. Left ventricular ejection fraction, by estimation, is 60 to 65%. The left ventricle has normal function. The left ventricle has no regional wall motion abnormalities. Left ventricular diastolic parameters were normal.  2. Right ventricular systolic function is  normal. The right ventricular size is normal.  3. The mitral valve is normal in structure. No evidence of mitral valve regurgitation. No evidence of mitral stenosis.  4. The aortic valve is normal in structure. Aortic valve regurgitation is not visualized. No aortic stenosis is present.  5. The inferior vena cava is normal in size with greater than 50% respiratory variability, suggesting right atrial pressure of 3 mmHg. FINDINGS  Left Ventricle: Left ventricular ejection fraction, by estimation, is 60 to 65%. The left ventricle has normal function. The left ventricle has no regional wall motion abnormalities. The left ventricular internal cavity size was normal in size. There is  no left ventricular hypertrophy. Left ventricular diastolic parameters were normal. Right Ventricle: The right ventricular size is normal. No increase in right ventricular wall thickness. Right ventricular systolic function is normal. Left Atrium: Left atrial size was normal in size. Right Atrium: Right atrial size was normal in size. Pericardium: There is no evidence of pericardial effusion. Mitral Valve: The mitral valve is normal in structure. No evidence of mitral valve regurgitation. No evidence of mitral valve stenosis. Tricuspid Valve: The tricuspid valve is normal in structure. Tricuspid valve regurgitation is not demonstrated. No evidence of tricuspid stenosis. Aortic Valve: The aortic valve is normal in structure. Aortic valve regurgitation is not visualized. No aortic stenosis is present. Aortic valve mean gradient measures 4.0 mmHg. Aortic valve  peak gradient measures 7.8 mmHg. Aortic valve area, by VTI measures 1.97 cm. Pulmonic Valve: The pulmonic valve was normal in structure. Pulmonic valve regurgitation is not visualized. No evidence of pulmonic stenosis. Aorta: The aortic root is normal in size and structure. Venous: The inferior vena cava is normal in size with greater than 50% respiratory variability, suggesting right  atrial pressure of 3 mmHg. IAS/Shunts: No atrial level shunt detected by color flow Doppler.  LEFT VENTRICLE PLAX 2D LVIDd:         4.90 cm     Diastology LVIDs:         2.80 cm     LV e' medial:    7.72 cm/s LV PW:         1.00 cm     LV E/e' medial:  11.0 LV IVS:        1.10 cm     LV e' lateral:   7.83 cm/s LVOT diam:     1.80 cm     LV E/e' lateral: 10.8 LV SV:         59 LV SV Index:   29 LVOT Area:     2.54 cm  LV Volumes (MOD) LV vol d, MOD A2C: 86.3 ml LV vol d, MOD A4C: 77.3 ml LV vol s, MOD A2C: 31.5 ml LV vol s, MOD A4C: 24.8 ml LV SV MOD A2C:     54.8 ml LV SV MOD A4C:     77.3 ml LV SV MOD BP:      57.5 ml RIGHT VENTRICLE RV S prime:     10.60 cm/s TAPSE (M-mode): 2.1 cm LEFT ATRIUM             Index        RIGHT ATRIUM           Index LA diam:        3.50 cm 1.73 cm/m   RA Area:     10.20 cm LA Vol (A2C):   36.9 ml 18.26 ml/m  RA Volume:   21.80 ml  10.79 ml/m LA Vol (A4C):   47.3 ml 23.40 ml/m LA Biplane Vol: 44.7 ml 22.12 ml/m  AORTIC VALVE AV Area (Vmax):    2.27 cm AV Area (Vmean):   2.28 cm AV Area (VTI):     1.97 cm AV Vmax:           140.00 cm/s AV Vmean:          96.500 cm/s AV VTI:            0.301 m AV Peak Grad:      7.8 mmHg AV Mean Grad:      4.0 mmHg LVOT Vmax:         125.00 cm/s LVOT Vmean:        86.400 cm/s LVOT VTI:          0.233 m LVOT/AV VTI ratio: 0.77  AORTA Ao Root diam: 3.35 cm Ao Asc diam:  3.10 cm MITRAL VALVE MV Area (PHT): 3.89 cm    SHUNTS MV Decel Time: 195 msec    Systemic VTI:  0.23 m MV E velocity: 84.80 cm/s  Systemic Diam: 1.80 cm MV A velocity: 84.80 cm/s MV E/A ratio:  1.00 Gypsy Balsam MD Electronically signed by Gypsy Balsam MD Signature Date/Time: 10/10/2023/1:45:15 PM    Final     Recent Results (from the past 2160 hours)  ECHOCARDIOGRAM COMPLETE     Status: None  Collection Time: 10/10/23 10:03 AM  Result Value Ref Range   S' Lateral 2.80 cm   Area-P 1/2 3.89 cm2   Ao pk vel 1.40 m/s   AR max vel 2.27 cm2   AV Peak grad 7.8 mmHg    AV Area VTI 1.97 cm2   AV Area mean vel 2.28 cm2   AV Mean grad 4.0 mmHg   Single Plane A2C EF 63.5 %   Single Plane A4C EF 67.9 %   Calc EF 65.9 %   Est EF 60 - 65%   Allergy Panel 11, [employer] [facility]     Status: None   Collection Time: 10/20/23  3:11 PM  Result Value Ref Range   CLADOSPORIUM HERBARUM (M2) IGE <0.10 kU/L   Class 0    Aspergillus fumigatus, m3 <0.10 kU/L   Class 0    Allergen, Mucor Racemosus, M4 <0.10 kU/L   CLASS 0    Candida Albicans <0.10 kU/L   CLASS 0    Allergen, A. alternata, m6 <0.10 kU/L   Class 0   Iron, Total/Total Iron Binding Cap     Status: Abnormal   Collection Time: 10/20/23  3:11 PM  Result Value Ref Range   Iron 49 40 - 190 mcg/dL   TIBC 846 962 - 952 mcg/dL (calc)   %SAT 15 (L) 16 - 45 % (calc)  Interpretation:     Status: None   Collection Time: 10/20/23  3:11 PM  Result Value Ref Range   Interpretation      Comment: . Specific                        Level of Allergen IGE Class      kU/L             Specific IGE Antibody  -----         ---------        -------------------   0              <0.10           Absent/Undetectable   0/1        0.10-0.34           Very Low Level   1          0.35-0.69           Low Level   2          0.70-3.49           Moderate Level   3          3.50-17.4           High Level   4          17.5-49.9           Very High Level   5            50-100            Very High Level   6              >100            Very High Level . The clinical relevance of allergen results of 0.10-0.34 kU/L are undetermined and intended for  specialist use. . Allergens denoted with a "**" include results using one or more analyte specific reagents. In those cases, the test was developed and its analytical performance characteristics have been determined by [employer]. It  has not been cleared or approved by the U.S. Food and Drug Administration. This assay  has been v alidated pursuant to the [employer]  and is  used for clinical purposes.   Vitamin B12     Status: None   Collection Time: 10/20/23  3:17 PM  Result Value Ref Range   Vitamin B-12 406 200 - 1,100 pg/mL  Ferritin     Status: None   Collection Time: 10/20/23  3:17 PM  Result Value Ref Range   Ferritin 36 16 - 232 ng/mL  Folate     Status: None   Collection Time: 10/20/23  3:17 PM  Result Value Ref Range   Folate 8.7 ng/mL    Comment:                            Reference Range                            Low:           <3.4                            Borderline:    3.4-5.4                            Normal:        >5.4 .   CBC w/Diff     Status: None   Collection Time: 10/20/23  3:17 PM  Result Value Ref Range   WBC 8.3 3.8 - 10.8 Thousand/uL   RBC 4.60 3.80 - 5.10 Million/uL   Hemoglobin 13.5 11.7 - 15.5 g/dL   HCT 21.3 08.6 - 57.8 %   MCV 87.0 80.0 - 100.0 fL   MCH 29.3 27.0 - 33.0 pg   MCHC 33.8 32.0 - 36.0 g/dL    Comment: For adults, a slight decrease in the calculated MCHC value (in the range of 30 to 32 g/dL) is most likely not clinically significant; however, it should be interpreted with caution in correlation with other red cell parameters and the patient's clinical condition.    RDW 14.0 11.0 - 15.0 %   Platelets 237 140 - 400 Thousand/uL   MPV 11.3 7.5 - 12.5 fL   Neutro Abs 5,876 1,500 - 7,800 cells/uL   Absolute Lymphocytes 1,843 850 - 3,900 cells/uL   Absolute Monocytes 506 200 - 950 cells/uL   Eosinophils Absolute 58 15 - 500 cells/uL   Basophils Absolute 17 0 - 200 cells/uL   Neutrophils Relative % 70.8 %   Total Lymphocyte 22.2 %   Monocytes Relative 6.1 %   Eosinophils Relative 0.7 %   Basophils Relative 0.2 %  Thyroid Panel With TSH     Status: None   Collection Time: 10/20/23  3:17 PM  Result Value Ref Range   T3 Uptake 30 22 - 35 %   T4, Total 6.3 5.1 - 11.9 mcg/dL   Free Thyroxine Index 1.9 1.4 - 3.8   TSH 1.32 mIU/L    Comment:           Reference Range .           > or = 20 Years   0.40-4.50 .                Pregnancy Ranges  First trimester    0.26-2.66           Second trimester   0.55-2.73           Third trimester    0.43-2.91   POC COVID-19 [employer]Now     Status: None   Collection Time: 01/03/24  2:56 PM  Result Value Ref Range   SARS Coronavirus 2 Ag Negative Negative  POCT Influenza A/B     Status: None   Collection Time: 01/03/24  2:56 PM  Result Value Ref Range   Influenza A, POC Negative Negative   Influenza B, POC Negative Negative        Garner Nash, MD, MS
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
## 2024-01-03 NOTE — Telephone Encounter (Signed)
 Faxed preop form to [facility] and placed copy in scan folder.

## 2024-01-04 DIAGNOSIS — J339 Nasal polyp, unspecified: Secondary | ICD-10-CM | POA: Diagnosis not present

## 2024-01-10 ENCOUNTER — Other Ambulatory Visit (INDEPENDENT_AMBULATORY_CARE_PROVIDER_SITE_OTHER)

## 2024-01-10 DIAGNOSIS — E66811 Obesity, class 1: Secondary | ICD-10-CM

## 2024-01-10 DIAGNOSIS — M0579 Rheumatoid arthritis with rheumatoid factor of multiple sites without organ or systems involvement: Secondary | ICD-10-CM

## 2024-01-10 DIAGNOSIS — Z01818 Encounter for other preprocedural examination: Secondary | ICD-10-CM | POA: Diagnosis not present

## 2024-01-10 DIAGNOSIS — D500 Iron deficiency anemia secondary to blood loss (chronic): Secondary | ICD-10-CM | POA: Diagnosis not present

## 2024-01-10 DIAGNOSIS — R197 Diarrhea, unspecified: Secondary | ICD-10-CM | POA: Diagnosis not present

## 2024-01-10 DIAGNOSIS — J4550 Severe persistent asthma, uncomplicated: Secondary | ICD-10-CM | POA: Diagnosis not present

## 2024-01-10 DIAGNOSIS — I10 Essential (primary) hypertension: Secondary | ICD-10-CM

## 2024-01-10 DIAGNOSIS — E6609 Other obesity due to excess calories: Secondary | ICD-10-CM

## 2024-01-10 DIAGNOSIS — Z6833 Body mass index (BMI) 33.0-33.9, adult: Secondary | ICD-10-CM | POA: Diagnosis not present

## 2024-01-10 LAB — CBC WITH DIFFERENTIAL/PLATELET
Basophils Absolute: 0 10*3/uL (ref 0.0–0.1)
Basophils Relative: 0.3 % (ref 0.0–3.0)
Eosinophils Absolute: 0.2 10*3/uL (ref 0.0–0.7)
Eosinophils Relative: 1.7 % (ref 0.0–5.0)
HCT: 40.9 % (ref 36.0–46.0)
Hemoglobin: 13.6 g/dL (ref 12.0–15.0)
Lymphocytes Relative: 24.2 % (ref 12.0–46.0)
Lymphs Abs: 2.5 10*3/uL (ref 0.7–4.0)
MCHC: 33.2 g/dL (ref 30.0–36.0)
MCV: 90.1 fl (ref 78.0–100.0)
Monocytes Absolute: 0.5 10*3/uL (ref 0.1–1.0)
Monocytes Relative: 5 % (ref 3.0–12.0)
Neutro Abs: 7.2 10*3/uL (ref 1.4–7.7)
Neutrophils Relative %: 68.8 % (ref 43.0–77.0)
Platelets: 241 10*3/uL (ref 150.0–400.0)
RBC: 4.54 Mil/uL (ref 3.87–5.11)
RDW: 15.2 % (ref 11.5–15.5)
WBC: 10.5 10*3/uL (ref 4.0–10.5)

## 2024-01-10 LAB — MICROALBUMIN / CREATININE URINE RATIO
Creatinine,U: 114.9 mg/dL
Microalb Creat Ratio: 19.9 mg/g (ref 0.0–30.0)
Microalb, Ur: 2.3 mg/dL — ABNORMAL HIGH (ref 0.0–1.9)

## 2024-01-10 LAB — COMPREHENSIVE METABOLIC PANEL WITH GFR
ALT: 12 U/L (ref 0–35)
AST: 15 U/L (ref 0–37)
Albumin: 4.2 g/dL (ref 3.5–5.2)
Alkaline Phosphatase: 58 U/L (ref 39–117)
BUN: 15 mg/dL (ref 6–23)
CO2: 27 meq/L (ref 19–32)
Calcium: 8.6 mg/dL (ref 8.4–10.5)
Chloride: 105 meq/L (ref 96–112)
Creatinine, Ser: 0.82 mg/dL (ref 0.40–1.20)
GFR: 85.01 mL/min (ref >60.00)
Glucose, Bld: 107 mg/dL — ABNORMAL HIGH (ref 70–99)
Potassium: 3.9 meq/L (ref 3.5–5.1)
Sodium: 141 meq/L (ref 135–145)
Total Bilirubin: 0.3 mg/dL (ref 0.2–1.2)
Total Protein: 6.5 g/dL (ref 6.0–8.3)

## 2024-01-10 LAB — LIPID PANEL
Cholesterol: 176 mg/dL (ref 0–200)
HDL: 39 mg/dL — ABNORMAL LOW (ref >39.00)
LDL Cholesterol: 80 mg/dL (ref 0–99)
NonHDL: 137.23
Total CHOL/HDL Ratio: 5
Triglycerides: 287 mg/dL — ABNORMAL HIGH (ref 0.0–149.0)
VLDL: 57.4 mg/dL — ABNORMAL HIGH (ref 0.0–40.0)

## 2024-01-10 LAB — HEMOGLOBIN A1C: Hgb A1c MFr Bld: 5.3 % (ref 4.6–6.5)

## 2024-01-10 LAB — TSH: TSH: 1.81 u[IU]/mL (ref 0.35–5.50)

## 2024-01-11 ENCOUNTER — Ambulatory Visit (HOSPITAL_COMMUNITY): Attending: Cardiology

## 2024-01-11 DIAGNOSIS — R079 Chest pain, unspecified: Secondary | ICD-10-CM | POA: Insufficient documentation

## 2024-01-11 LAB — MYOCARDIAL PERFUSION IMAGING
LV dias vol: 87 mL (ref 46–106)
LV sys vol: 36 mL
Nuc Stress EF: 59 %
Peak HR: 111 {beats}/min
Rest HR: 84 {beats}/min
Rest Nuclear Isotope Dose: 10.6 mCi
SDS: 0
SRS: 0
SSS: 0
ST Depression (mm): 0 mm
Stress Nuclear Isotope Dose: 31.6 mCi
TID: 1

## 2024-01-11 MED ORDER — TECHNETIUM TC 99M TETROFOSMIN IV KIT
10.6000 | PACK | Freq: Once | INTRAVENOUS | Status: AC | PRN
  Administered 2024-01-11: 10.6 via INTRAVENOUS

## 2024-01-11 MED ORDER — TECHNETIUM TC 99M TETROFOSMIN IV KIT
31.6000 | PACK | Freq: Once | INTRAVENOUS | Status: AC | PRN
  Administered 2024-01-11: 31.6 via INTRAVENOUS

## 2024-01-11 MED ORDER — REGADENOSON 0.4 MG/5ML IV SOLN
0.4000 mg | Freq: Once | INTRAVENOUS | Status: AC
Start: 2024-01-11 — End: 2024-01-11
  Administered 2024-01-11: 0.4 mg via INTRAVENOUS

## 2024-01-12 LAB — URINALYSIS W MICROSCOPIC + REFLEX CULTURE
Bacteria, UA: NONE SEEN /HPF
Bilirubin Urine: NEGATIVE
Glucose, UA: NEGATIVE
Hyaline Cast: NONE SEEN /LPF
Ketones, ur: NEGATIVE
Nitrites, Initial: NEGATIVE
Protein, ur: NEGATIVE
RBC / HPF: NONE SEEN /HPF (ref 0–2)
Specific Gravity, Urine: 1.016 (ref 1.001–1.035)
pH: 6.5 (ref 5.0–8.0)

## 2024-01-12 LAB — URINE CULTURE
MICRO NUMBER:: 16310352
SPECIMEN QUALITY:: ADEQUATE

## 2024-01-12 LAB — CULTURE INDICATED

## 2024-01-13 DIAGNOSIS — Z419 Encounter for procedure for purposes other than remedying health state, unspecified: Secondary | ICD-10-CM | POA: Diagnosis not present

## 2024-01-15 ENCOUNTER — Other Ambulatory Visit: Payer: Self-pay | Admitting: Family Medicine

## 2024-01-15 ENCOUNTER — Telehealth: Payer: Self-pay | Admitting: Family Medicine

## 2024-01-15 ENCOUNTER — Encounter: Payer: Self-pay | Admitting: Family Medicine

## 2024-01-15 DIAGNOSIS — R8281 Pyuria: Secondary | ICD-10-CM

## 2024-01-15 NOTE — Telephone Encounter (Signed)
 01/02/2024 same day cancel/pt called e2c2 3:12p for 3:20p appt 12/29/2023 late arrival/came in 3:36p for 3:20p appt  Pt was seen 01/03/2024  Final warning letter sent via mail and mychart

## 2024-01-17 ENCOUNTER — Ambulatory Visit (INDEPENDENT_AMBULATORY_CARE_PROVIDER_SITE_OTHER): Admitting: Internal Medicine

## 2024-01-17 ENCOUNTER — Ambulatory Visit: Admitting: Internal Medicine

## 2024-01-17 ENCOUNTER — Encounter: Payer: Self-pay | Admitting: Internal Medicine

## 2024-01-17 ENCOUNTER — Other Ambulatory Visit

## 2024-01-17 VITALS — BP 150/90 | HR 83 | Temp 98.1°F | Ht 66.0 in | Wt 218.0 lb

## 2024-01-17 DIAGNOSIS — R8281 Pyuria: Secondary | ICD-10-CM

## 2024-01-17 DIAGNOSIS — J4551 Severe persistent asthma with (acute) exacerbation: Secondary | ICD-10-CM | POA: Diagnosis not present

## 2024-01-17 DIAGNOSIS — J22 Unspecified acute lower respiratory infection: Secondary | ICD-10-CM | POA: Diagnosis not present

## 2024-01-17 LAB — POC COVID19 BINAXNOW: SARS Coronavirus 2 Ag: NEGATIVE

## 2024-01-17 LAB — POC INFLUENZA A&B (BINAX/QUICKVUE)
Influenza A, POC: NEGATIVE
Influenza B, POC: NEGATIVE

## 2024-01-17 MED ORDER — PROMETHAZINE-DM 6.25-15 MG/5ML PO SYRP: 5.0000 mL | ORAL_SOLUTION | Freq: Four times a day (QID) | ORAL | 0 refills | Status: DC | PRN

## 2024-01-17 MED ORDER — DOXYCYCLINE HYCLATE 100 MG PO TABS: 100.0000 mg | ORAL_TABLET | Freq: Two times a day (BID) | ORAL | 0 refills | Status: AC

## 2024-01-17 MED ORDER — METHYLPREDNISOLONE 4 MG PO TBPK
ORAL_TABLET | ORAL | 0 refills | Status: DC
Start: 2024-01-17 — End: 2024-03-07

## 2024-01-17 NOTE — Progress Notes (Signed)
 [facility] [address] [place] [address] Dept: [phone] Dept Fax: [phone]  Acute Care Office Visit  Subjective:   Renee Cross Jan 08, 1976 01/17/2024  Chief Complaint  Patient presents with   Cough   Wheezing    Started over the weekend    HPI: Discussed the use of AI scribe software for clinical note transcription with the patient, who gave verbal consent to proceed.  History of Present Illness   The patient, with a history of severe asthma, presents with a cough that started over the weekend and has progressively worsened. She describes the cough as productive, with thick Say mucus. She also reports difficulty breathing. ' She denies any wheezing over the weekend, but has noticed it over the past couple days. She also reports occasional headaches and poor sleep. She denies fever, chills, and chest pain, but reports some body aches which she attributes to the coughing. She also reports some runny nose and congestion, but symptoms are primarily in her chest with coughing. She also reports occasional diarrhea. She has not taken any over-the-counter medications for her symptoms. She has been using her albuterol inhaler, Airsupra, and DuoNeb nebulizer treatments more frequently due to her symptoms.    The following portions of the patient's history were reviewed and updated as appropriate: past medical history, past surgical history, family history, social history, allergies, medications, and problem list.   Patient Active Problem List   Diagnosis Date Noted   Asthma 09/12/2023   Multiple allergies 09/12/2023   Arthritis    Depression    Hiatal hernia with GERD    Hypertension    Pneumonia    PTSD (post-traumatic stress disorder)    Sickle cell trait (HCC)    Class 1 obesity due to excess calories with serious comorbidity and body mass index (BMI) of 33.0 to 33.9 in adult 08/26/2023   Screening for colon  cancer 08/26/2023   MDD (major depressive disorder), recurrent episode, moderate (HCC) 08/23/2023   Osteoarthritis of right knee 07/02/2023   Bilateral knee pain 06/28/2023   Non-recurrent acute suppurative otitis media of left ear without spontaneous rupture of tympanic membrane 05/16/2023   Tobacco use disorder 05/16/2023   Alcohol use disorder 01/14/2023   Financial difficulty 01/14/2023   Episode of recurrent major depressive disorder (HCC) 01/14/2023   B12 deficiency 01/14/2023   History of suicide attempt 01/14/2023   Amblyopia of left eye 12/22/2022   Anemia 12/22/2022   Rheumatoid arthritis (HCC) 12/22/2022   Abnormal uterine bleeding (AUB) 12/22/2022   Severe persistent asthma with exacerbation 12/22/2022   Hip arthritis 12/22/2022   Gastroesophageal reflux disease 12/22/2022   Perennial allergic rhinitis with seasonal variation 12/22/2022   Encounter for disability assessment 12/22/2022   Overuse of medication 12/22/2022   Primary hypertension 12/22/2022   Iron deficiency anemia due to chronic blood loss 03/31/2022   Osteoarthritis of both hips 01/21/2022   Primary osteoarthritis of both knees 01/21/2022   Elevated rheumatoid factor 12/28/2021   Microcytic hypochromic anemia 12/28/2021   Acute recurrent sinusitis 01/27/2021   Nasal polyp 01/27/2021   Left groin mass 09/17/2020   Past Medical History:  Diagnosis Date   Abnormal uterine bleeding (AUB) 12/22/2022   Acute recurrent sinusitis 01/27/2021   Alcohol use disorder 01/14/2023   Amblyopia of left eye 12/22/2022   Anemia    Arthritis    Asthma 09/12/2023   B12 deficiency 01/14/2023   Bilateral knee pain 06/28/2023   Class 1 drug-induced obesity  with serious comorbidity and body mass index (BMI) of 33.0 to 33.9 in adult 08/26/2023   Depression    Elevated rheumatoid factor 12/28/2021   Encounter for disability assessment 12/22/2022   Episode of recurrent major depressive disorder (HCC) 01/14/2023    Financial difficulty 01/14/2023   Gastroesophageal reflux disease 12/22/2022   Hiatal hernia with GERD    Hip arthritis 12/22/2022   History of suicide attempt 01/14/2023   Hypertension    Iron deficiency anemia due to chronic blood loss 03/31/2022   Left groin mass 09/17/2020   MDD (major depressive disorder), recurrent episode, moderate (HCC) 08/23/2023   Microcytic hypochromic anemia 12/28/2021   Multiple allergies 09/12/2023   Nasal polyp 01/27/2021   Polypectomy over 20 years ago.     Non-recurrent acute suppurative otitis media of left ear without spontaneous rupture of tympanic membrane 05/16/2023   Osteoarthritis of both hips 01/21/2022   Osteoarthritis of right knee 07/02/2023   Overuse of medication 12/22/2022   Perennial allergic rhinitis with seasonal variation 12/22/2022   Pneumonia    Primary hypertension 12/22/2022   Primary osteoarthritis of both knees 01/21/2022   PTSD (post-traumatic stress disorder)    Rheumatoid arthritis (HCC) 12/22/2022   Screening for colon cancer 08/26/2023   Severe persistent asthma with exacerbation 12/22/2022   Sickle cell trait (HCC)    Tobacco use disorder 05/16/2023   Past Surgical History:  Procedure Laterality Date   HERNIA REPAIR     SINUS EXPLORATION     STOMACH SURGERY  10/03/2001   Possible fundiplication?   TONSILLECTOMY     TOTAL HIP ARTHROPLASTY Right 02/14/2023   Procedure: TOTAL HIP ARTHROPLASTY ANTERIOR APPROACH;  Surgeon: Saundra Curl, MD;  Location: [facility];  Service: Orthopedics;  Laterality: Right;   TUBAL LIGATION     Family History  Problem Relation Age of Onset   Hypertension Mother    Cancer Maternal Aunt    Heart disease Neg Hx     Current Outpatient Medications:    albuterol (VENTOLIN HFA) 108 (90 Base) MCG/ACT inhaler, 2 PUFFS EVERY 4-6 HOURS AS NEEDED FOR COUGH OR WHEEZE., Disp: 18 each, Rfl: 1   Albuterol-Budesonide (AIRSUPRA) 90-80 MCG/ACT AERO, Inhale 2 puffs into the lungs every 4 (four)  hours as needed (cough, wheezing)., Disp: 10.7 g, Rfl: 3   bupivacaine, PF, (MARCAINE) 0.25 % SOLN injection, , Disp: , Rfl:    cetirizine (ZYRTEC) 10 MG tablet, Take 1 tablet (10 mg total) by mouth daily. 1 po q day prn allergies, Disp: 30 tablet, Rfl: 0   doxycycline (VIBRA-TABS) 100 MG tablet, Take 1 tablet (100 mg total) by mouth 2 (two) times daily for 7 days., Disp: 14 tablet, Rfl: 0   DULoxetine (CYMBALTA) 60 MG capsule, Take 1 capsule (60 mg total) by mouth daily., Disp: 30 capsule, Rfl: 2   esomeprazole (NEXIUM) 40 MG capsule, Take 1 capsule (40 mg total) by mouth every morning., Disp: 30 capsule, Rfl: 5   famotidine (PEPCID) 40 MG tablet, Take 1 tablet (40 mg total) by mouth at bedtime., Disp: 30 tablet, Rfl: 5   ferrous sulfate 325 (65 FE) MG EC tablet, Take 325 mg by mouth daily., Disp: , Rfl:    fluticasone (FLONASE) 50 MCG/ACT nasal spray, Place 2 sprays into both nostrils every morning. 2 sprays per nostril every day for stuffy nose or drainage., Disp: 16 g, Rfl: 5   ipratropium-albuterol (DUONEB) 0.5-2.5 (3) MG/3ML SOLN, Take 3 mLs by nebulization every 4 (four) hours as needed., Disp:  360 mL, Rfl: 0   IRON, FERROUS GLUCONATE, PO, Take by mouth., Disp: , Rfl:    loratadine (CLARITIN) 10 MG tablet, Take 1 tablet (10 mg total) by mouth 2 (two) times daily., Disp: 180 tablet, Rfl: 1   medroxyPROGESTERone (PROVERA) 10 MG tablet, Take 10 mg by mouth daily., Disp: , Rfl:    meloxicam (MOBIC) 15 MG tablet, Take 1 tablet (15 mg total) by mouth daily. For 2 weeks for pain and inflammation. Then take as needed, Disp: 30 tablet, Rfl: 0   methocarbamol (ROBAXIN) 500 MG tablet, Take 1 tablet (500 mg total) by mouth every 8 (eight) hours as needed for muscle spasms., Disp: 30 tablet, Rfl: 0   methylPREDNISolone (MEDROL DOSEPAK) 4 MG TBPK tablet, Use as directed., Disp: 21 each, Rfl: 0   mometasone (ELOCON) 0.1 % ointment, Apply topically daily. Apply once daily to red itchy areas or until resolved,  Disp: 135 g, Rfl: 1   mometasone-formoterol (DULERA) 200-5 MCG/ACT AERO, Inhale 2 puffs into the lungs 2 (two) times daily., Disp: 1 each, Rfl: 5   montelukast (SINGULAIR) 10 MG tablet, TAKE 1 TABLET BY MOUTH EVERYDAY AT BEDTIME, Disp: 30 tablet, Rfl: 3   Multiple Vitamin (MULTIVITAMIN WITH MINERALS) TABS tablet, Take 1 tablet by mouth daily., Disp: , Rfl:    Olmesartan-amLODIPine-HCTZ 40-10-25 MG TABS, Take 1 tablet by mouth daily., Disp: 90 tablet, Rfl: 3   Olopatadine HCl (PATADAY) 0.2 % SOLN, Place 1 drop into both eyes daily as needed (allergies)., Disp: 2.5 mL, Rfl: 3   QUEtiapine (SEROQUEL) 100 MG tablet, Take 1 tablet (100 mg total) by mouth at bedtime., Disp: 30 tablet, Rfl: 2   Semaglutide-Weight Management (WEGOVY) 0.5 MG/0.5ML SOAJ, Inject 0.5 mg into the skin once a week., Disp: 6 mL, Rfl: 0   tezepelumab-ekko (TEZSPIRE) 210 MG/1. syringe, Inject 1.91 mLs (210 mg total) into the skin every 28 (twenty-eight) days., Disp: 1.91 mL, Rfl: 11   triamcinolone acetonide (KENALOG-40) 40 MG/ML injection, , Disp: , Rfl:    promethazine-dextromethorphan (PROMETHAZINE-DM) 6.25-15 MG/5ML syrup, Take 5 mLs by mouth 4 (four) times daily as needed., Disp: 180 mL, Rfl: 0 No Known Allergies   ROS: A complete ROS was performed with pertinent positives/negatives noted in the HPI. The remainder of the ROS are negative.    Objective:   Today's Vitals   01/17/24 1428  BP: (!) 150/90  Pulse: 83  Temp: 98.1 F (36.7 C)  TempSrc: Temporal  SpO2: 98%  Weight: 218 lb (98.9 kg)  Height: 5\' 6"  (1.676 m)    GENERAL: ill-appearing, non-toxic. in NAD. Well nourished.  SKIN: Pink, warm and dry. No rash, lesion, ulceration, or ecchymoses.  HEENT:    HEAD: Normocephalic, non-traumatic.  EYES: Conjunctive pink without exudate.  EARS: External ear w/o redness, swelling, masses, or lesions. EAC clear. TM's intact, translucent w/o bulging, appropriate landmarks visualized.  NOSE: Septum midline w/o  deformity. Nares patent, mucosa pink and non-inflamed w/o drainage. No sinus tenderness.  THROAT: Uvula midline. Oropharynx clear. Tonsils non-inflamed w/o exudate. Mucus membranes pink and moist.  NECK: Trachea midline. Full ROM w/o pain or tenderness. Submandibular lymph node enlargement RESPIRATORY: Chest wall symmetrical. Respirations even and non-labored. Breath sounds clear to auscultation bilaterally. (+) productive cough with Narine sputum CARDIAC: S1, S2 present, regular rate and rhythm. Peripheral pulses 2+ bilaterally.  EXTREMITIES: Without clubbing, cyanosis, or edema.  NEUROLOGIC:  Steady, even gait.  PSYCH/MENTAL STATUS: Alert, oriented x 3. Cooperative, appropriate mood and affect.    Results  for orders placed or performed in visit on 01/17/24  POC COVID-19  Result Value Ref Range   SARS Coronavirus 2 Ag Negative Negative  POC Influenza A&B (Binax test)  Result Value Ref Range   Influenza A, POC Negative Negative   Influenza B, POC Negative Negative      Assessment & Plan:  1. Acute respiratory infection (Primary) - POC COVID-19 - POC Influenza A&B (Binax test) - methylPREDNISolone (MEDROL DOSEPAK) 4 MG TBPK tablet; Use as directed.  Dispense: 21 each; Refill: 0 - promethazine-dextromethorphan (PROMETHAZINE-DM) 6.25-15 MG/5ML syrup; Take 5 mLs by mouth 4 (four) times daily as needed.  Dispense: 180 mL; Refill: 0 - doxycycline (VIBRA-TABS) 100 MG tablet; Take 1 tablet (100 mg total) by mouth 2 (two) times daily for 7 days.  Dispense: 14 tablet; Refill: 0 - patient intolerant of PCN associated ABX d/t GI upset. Unable to do Azithromycin d/t risk of QT prolongation with seroquel  2. Severe persistent asthma with exacerbation - methylPREDNISolone (MEDROL DOSEPAK) 4 MG TBPK tablet; Use as directed.  Dispense: 21 each; Refill: 0 - continue duonebs and airsupra PRN for SHOB/wheezing.    Meds ordered this encounter  Medications   methylPREDNISolone (MEDROL DOSEPAK) 4 MG TBPK  tablet    Sig: Use as directed.    Dispense:  21 each    Refill:  0    Supervising Provider:   THOMPSON, AARON B [[id number]]   promethazine-dextromethorphan (PROMETHAZINE-DM) 6.25-15 MG/5ML syrup    Sig: Take 5 mLs by mouth 4 (four) times daily as needed.    Dispense:  180 mL    Refill:  0    Supervising Provider:   THOMPSON, AARON B [[id number]]   doxycycline (VIBRA-TABS) 100 MG tablet    Sig: Take 1 tablet (100 mg total) by mouth 2 (two) times daily for 7 days.    Dispense:  14 tablet    Refill:  0    Supervising Provider:   Catheryn Cluck [[id number]]   Orders Placed This Encounter  Procedures   POC COVID-19    Previously tested for COVID-19:   No    Resident in a congregate (group) care setting:   No    Employed in healthcare setting:   No    Pregnant:   No   POC Influenza A&B (Binax test)   Lab Orders         POC COVID-19         POC Influenza A&B (Binax test)     No images are attached to the encounter or orders placed in the encounter.  Return if symptoms worsen or fail to improve.   Gavin Kast, FNP

## 2024-01-19 LAB — URINE CULTURE
MICRO NUMBER:: 16340244
SPECIMEN QUALITY:: ADEQUATE

## 2024-01-19 LAB — URINALYSIS W MICROSCOPIC + REFLEX CULTURE
Bacteria, UA: NONE SEEN /HPF
Bilirubin Urine: NEGATIVE
Glucose, UA: NEGATIVE
Hgb urine dipstick: NEGATIVE
Hyaline Cast: NONE SEEN /LPF
Ketones, ur: NEGATIVE
Nitrites, Initial: NEGATIVE
Protein, ur: NEGATIVE
RBC / HPF: NONE SEEN /HPF (ref 0–2)
Specific Gravity, Urine: 1.011 (ref 1.001–1.035)
pH: 7 (ref 5.0–8.0)

## 2024-01-19 LAB — CULTURE INDICATED

## 2024-01-22 ENCOUNTER — Encounter: Payer: Self-pay | Admitting: Family Medicine

## 2024-01-23 ENCOUNTER — Ambulatory Visit: Admitting: Allergy and Immunology

## 2024-01-25 DIAGNOSIS — D500 Iron deficiency anemia secondary to blood loss (chronic): Secondary | ICD-10-CM | POA: Diagnosis not present

## 2024-02-12 DIAGNOSIS — Z419 Encounter for procedure for purposes other than remedying health state, unspecified: Secondary | ICD-10-CM | POA: Diagnosis not present

## 2024-02-29 NOTE — Progress Notes (Signed)
 Surgery orders requested via Epic inbox.

## 2024-03-01 DIAGNOSIS — M1612 Unilateral primary osteoarthritis, left hip: Secondary | ICD-10-CM | POA: Diagnosis not present

## 2024-03-06 DIAGNOSIS — M6281 Muscle weakness (generalized): Secondary | ICD-10-CM | POA: Diagnosis not present

## 2024-03-06 DIAGNOSIS — M1612 Unilateral primary osteoarthritis, left hip: Secondary | ICD-10-CM | POA: Diagnosis not present

## 2024-03-06 DIAGNOSIS — R262 Difficulty in walking, not elsewhere classified: Secondary | ICD-10-CM | POA: Diagnosis not present

## 2024-03-06 NOTE — Progress Notes (Addendum)
 PCP - Gavin Kast, NP LOV 01-17-24 epic Cardiologist - Hillis Lu, MD LOV 09-13-23 epic  . Tele clearance 12-28-23 clearance for a left hip scope  PPM/ICD -  Device Orders -  Rep Notified -   Chest x-ray -  EKG - 09-13-23 epic Stress Test - 01-11-24 epic ECHO - 10-10-23 epic Cardiac Cath -  CTA chest 08-28-23 epic  Sleep Study -  CPAP -   Fasting Blood Sugar -  Checks Blood Sugar _____ times a day  Blood Thinner Instructions: Aspirin  Instructions:  ERAS Protcol - PRE-SURGERY Ensure     COVID vaccine -  Activity-- Anesthesia review: HTN, Asthma, PTSD, murmur  Patient denies shortness of breath, fever, cough and chest pain at PAT appointment   All instructions explained to the patient, with a verbal understanding of the material. Patient agrees to go over the instructions while at home for a better understanding. Patient also instructed to self quarantine after being tested for COVID-19. The opportunity to ask questions was provided.

## 2024-03-06 NOTE — Patient Instructions (Signed)
 SURGICAL WAITING ROOM VISITATION  Patients having surgery or a procedure may have no more than 2 support people in the waiting area - these visitors may rotate.    Children under the age of 72 must have an adult with them who is not the patient.  Visitors with respiratory illnesses are discouraged from visiting and should remain at home.  If the patient needs to stay at the hospital during part of their recovery, the visitor guidelines for inpatient rooms apply. Pre-op nurse will coordinate an appropriate time for 1 support person to accompany patient in pre-op.  This support person may not rotate.    Please refer to the [facility] website for the visitor guidelines for Inpatients (after your surgery is over and you are in a regular room).       Your procedure is scheduled on: 03-19-24   Report to [facility] Main Entrance    Report to admitting at       0730   AM   Call this number if you have problems the morning of surgery [phone]   Do not eat food :After Midnight.   After Midnight you may have the following liquids until __0700____ AM/ DAY OF SURGERY  THEN NOTHING BY MOUTH  Water  Non-Citrus Juices (without pulp, NO RED-Apple, [place] grape, [place]ender cranberry) Black Coffee (NO MILK/CREAM OR CREAMERS, sugar ok)  Clear Tea (NO MILK/CREAM OR CREAMERS, sugar ok) regular and decaf                             Plain Jell-O (NO RED)                                           Fruit ices (not with fruit pulp, NO RED)                                     Popsicles (NO RED)                                                               Sports drinks like Gatorade (NO RED)                    The day of surgery:  Drink ONE (1) Pre-Surgery Clear Ensure or G2 by  0700  AM the morning of surgery. Drink in one sitting. Do not sip.  This drink was given to you during your hospital  pre-op appointment visit. Nothing else to drink after completing the  Pre-Surgery Clear Ensure or  G2.          If you have questions, please contact your surgeon's office.   FOLLOW  ANY ADDITIONAL PRE OP INSTRUCTIONS YOU RECEIVED FROM YOUR SURGEON'S OFFICE!!!     Oral Hygiene is also important to reduce your risk of infection.                                    Remember - BRUSH YOUR TEETH  THE MORNING OF SURGERY WITH YOUR REGULAR TOOTHPASTE  DENTURES WILL BE REMOVED PRIOR TO SURGERY PLEASE DO NOT APPLY "Poly grip" OR ADHESIVES!!!   Do NOT smoke after Midnight   Stop all vitamins and herbal supplements 7 days before surgery.   Take these medicines the morning of surgery with A SIP OF WATER : inhalers and bring rescue inhaler, provera, loratadine , flonase , nexium , cymbalta , zyrtec , nebulizer if needed  WEGOVY  HOLD ONE WEEK PRIOR TO SURGERY  DO NOT TAKE ANY ORAL DIABETIC MEDICATIONS DAY OF YOUR SURGERY  Bring CPAP mask and tubing day of surgery.                              You may not have any metal on your body including hair pins, jewelry, and body piercing             Do not wear make-up, lotions, powders, perfumes/cologne, or deodorant  Do not wear nail polish including gel and S&S, artificial/acrylic nails, or any other type of covering on natural nails including finger and toenails. If you have artificial nails, gel coating, etc. that needs to be removed by a nail salon please have this removed prior to surgery or surgery may need to be canceled/ delayed if the surgeon/ anesthesia feels like they are unable to be safely monitored.   Do not shave  48 hours prior to surgery.            Do not bring valuables to the hospital. [facility] IS NOT             RESPONSIBLE   FOR VALUABLES.   Contacts, glasses, dentures or bridgework may not be worn into surgery.   Bring small overnight bag day of surgery.   DO NOT BRING YOUR HOME MEDICATIONS TO THE HOSPITAL. PHARMACY WILL DISPENSE MEDICATIONS LISTED ON YOUR MEDICATION LIST TO YOU DURING YOUR ADMISSION IN THE  HOSPITAL!    Patients discharged on the day of surgery will not be allowed to drive home.  Someone NEEDS to stay with you for the first 24 hours after anesthesia.   Special Instructions: Bring a copy of your healthcare power of attorney and living will documents the day of surgery if you haven't scanned them before.              Please read over the following fact sheets you were given: IF YOU HAVE QUESTIONS ABOUT YOUR PRE-OP INSTRUCTIONS PLEASE CALL [phone]   . If you test positive for Covid or have been in contact with anyone that has tested positive in the last 10 days please notify you surgeon.      Pre-operative 5 CHG Bath Instructions   You can play a key role in reducing the risk of infection after surgery. Your skin needs to be as free of germs as possible. You can reduce the number of germs on your skin by washing with CHG (chlorhexidine  gluconate) soap before surgery. CHG is an antiseptic soap that kills germs and continues to kill germs even after washing.   DO NOT use if you have an allergy  to chlorhexidine /CHG or antibacterial soaps. If your skin becomes reddened or irritated, stop using the CHG and notify one of our RNs at [phone].   Please shower with the CHG soap starting 4 days before surgery using the following schedule:     Please keep in mind the following:  DO NOT shave, including legs and underarms, starting  the day of your first shower.   You may shave your face at any point before/day of surgery.  Place clean sheets on your bed the day you start using CHG soap. Use a clean washcloth (not used since being washed) for each shower. DO NOT sleep with pets once you start using the CHG.   CHG Shower Instructions:  If you choose to wash your hair and private area, wash first with your normal shampoo/soap.  After you use shampoo/soap, rinse your hair and body thoroughly to remove shampoo/soap residue.  Turn the water  OFF and apply about 3 tablespoons (45 ml)  of CHG soap to a CLEAN washcloth.  Apply CHG soap ONLY FROM YOUR NECK DOWN TO YOUR TOES (washing for 3-5 minutes)  DO NOT use CHG soap on face, private areas, open wounds, or sores.  Pay special attention to the area where your surgery is being performed.  If you are having back surgery, having someone wash your back for you may be helpful. Wait 2 minutes after CHG soap is applied, then you may rinse off the CHG soap.  Pat dry with a clean towel  Put on clean clothes/pajamas   If you choose to wear lotion, please use ONLY the CHG-compatible lotions on the back of this paper.     Additional instructions for the day of surgery: DO NOT APPLY any lotions, deodorants, cologne, or perfumes.   Put on clean/comfortable clothes.  Brush your teeth.  Ask your nurse before applying any prescription medications to the skin.      CHG Compatible Lotions   Aveeno Moisturizing lotion  Cetaphil Moisturizing Cream  Cetaphil Moisturizing Lotion  Clairol Herbal Essence Moisturizing Lotion, Dry Skin  Clairol Herbal Essence Moisturizing Lotion, Extra Dry Skin  Clairol Herbal Essence Moisturizing Lotion, Normal Skin  Curel Age Defying Therapeutic Moisturizing Lotion with Alpha Hydroxy  Curel Extreme Care Body Lotion  Curel Soothing Hands Moisturizing Hand Lotion  Curel Therapeutic Moisturizing Cream, Fragrance-Free  Curel Therapeutic Moisturizing Lotion, Fragrance-Free  Curel Therapeutic Moisturizing Lotion, Original Formula  Eucerin Daily Replenishing Lotion  Eucerin Dry Skin Therapy Plus Alpha Hydroxy Crme  Eucerin Dry Skin Therapy Plus Alpha Hydroxy Lotion  Eucerin Original Crme  Eucerin Original Lotion  Eucerin Plus Crme Eucerin Plus Lotion  Eucerin TriLipid Replenishing Lotion  Keri Anti-Bacterial Hand Lotion  Keri Deep Conditioning Original Lotion Dry Skin Formula Softly Scented  Keri Deep Conditioning Original Lotion, Fragrance Free Sensitive Skin Formula  Keri Lotion Fast Absorbing  Fragrance Free Sensitive Skin Formula  Keri Lotion Fast Absorbing Softly Scented Dry Skin Formula  Keri Original Lotion  Keri Skin Renewal Lotion Keri Silky Smooth Lotion  Keri Silky Smooth Sensitive Skin Lotion  Nivea Body Creamy Conditioning Oil  Nivea Body Extra Enriched Teacher, adult education Moisturizing Lotion Nivea Crme  Nivea Skin Firming Lotion  NutraDerm 30 Skin Lotion  NutraDerm Skin Lotion  NutraDerm Therapeutic Skin Cream  NutraDerm Therapeutic Skin Lotion  ProShield Protective Hand Cream  WHAT IS A BLOOD TRANSFUSION? Blood Transfusion Information  A transfusion is the replacement of blood or some of its parts. Blood is made up of multiple cells which provide different functions. Red blood cells carry oxygen and are used for blood loss replacement. [place] blood cells fight against infection. Platelets control bleeding. Plasma helps clot blood. Other blood products are available for specialized needs, such as hemophilia or other clotting disorders. BEFORE THE TRANSFUSION  Who gives blood for transfusions?  Healthy  volunteers who are fully evaluated to make sure their blood is safe. This is blood bank blood. Transfusion therapy is the safest it has ever been in the practice of medicine. Before blood is taken from a donor, a complete history is taken to make sure that person has no history of diseases nor engages in risky social behavior (examples are intravenous drug use or sexual activity with multiple partners). The donor's travel history is screened to minimize risk of transmitting infections, such as malaria. The donated blood is tested for signs of infectious diseases, such as HIV and hepatitis. The blood is then tested to be sure it is compatible with you in order to minimize the chance of a transfusion reaction. If you or a relative donates blood, this is often done in anticipation of surgery and is not appropriate for emergency situations.  It takes many days to process the donated blood. RISKS AND COMPLICATIONS Although transfusion therapy is very safe and saves many lives, the main dangers of transfusion include:  Getting an infectious disease. Developing a transfusion reaction. This is an allergic reaction to something in the blood you were given. Every precaution is taken to prevent this. The decision to have a blood transfusion has been considered carefully by your caregiver before blood is given. Blood is not given unless the benefits outweigh the risks. AFTER THE TRANSFUSION Right after receiving a blood transfusion, you will usually feel much better and more energetic. This is especially true if your red blood cells have gotten low (anemic). The transfusion raises the level of the red blood cells which carry oxygen, and this usually causes an energy increase. The nurse administering the transfusion will monitor you carefully for complications. HOME CARE INSTRUCTIONS  No special instructions are needed after a transfusion. You may find your energy is better. Speak with your caregiver about any limitations on activity for underlying diseases you may have. SEEK MEDICAL CARE IF:  Your condition is not improving after your transfusion. You develop redness or irritation at the intravenous (IV) site. SEEK IMMEDIATE MEDICAL CARE IF:  Any of the following symptoms occur over the next 12 hours: Shaking chills. You have a temperature by mouth above 102 F (38.9 C), not controlled by medicine. Chest, back, or muscle pain. People around you feel you are not acting correctly or are confused. Shortness of breath or difficulty breathing. Dizziness and fainting. You get a rash or develop hives. You have a decrease in urine output. Your urine turns a dark color or changes to pink, red, or brown. Any of the following symptoms occur over the next 10 days: You have a temperature by mouth above 102 F (38.9 C), not controlled by  medicine. Shortness of breath. Weakness after normal activity. The [place] part of the eye turns yellow (jaundice). You have a decrease in the amount of urine or are urinating less often. Your urine turns a dark color or changes to pink, red, or brown. Document Released: 09/16/2000 Document Revised: 12/12/2011 Document Reviewed: 05/05/2008 Exit[employer] Patient Information 2014 [place], [place].  _______________________________________________________________________  Incentive Spirometer  An incentive spirometer is a tool that can help keep your lungs clear and active. This tool measures how well you are filling your lungs with each breath. Taking long deep breaths may help reverse or decrease the chance of developing breathing (pulmonary) problems (especially infection) following: A long period of time when you are unable to move or be active. BEFORE THE PROCEDURE  If the spirometer includes an indicator to  show your best effort, your nurse or respiratory therapist will set it to a desired goal. If possible, sit up straight or lean slightly forward. Try not to slouch. Hold the incentive spirometer in an upright position. INSTRUCTIONS FOR USE  Sit on the edge of your bed if possible, or sit up as far as you can in bed or on a chair. Hold the incentive spirometer in an upright position. Breathe out normally. Place the mouthpiece in your mouth and seal your lips tightly around it. Breathe in slowly and as deeply as possible, raising the piston or the ball toward the top of the column. Hold your breath for 3-5 seconds or for as long as possible. Allow the piston or ball to fall to the bottom of the column. Remove the mouthpiece from your mouth and breathe out normally. Rest for a few seconds and repeat Steps 1 through 7 at least 10 times every 1-2 hours when you are awake. Take your time and take a few normal breaths between deep breaths. The spirometer may include an indicator to show your best  effort. Use the indicator as a goal to work toward during each repetition. After each set of 10 deep breaths, practice coughing to be sure your lungs are clear. If you have an incision (the cut made at the time of surgery), support your incision when coughing by placing a pillow or rolled up towels firmly against it. Once you are able to get out of bed, walk around indoors and cough well. You may stop using the incentive spirometer when instructed by your caregiver.  RISKS AND COMPLICATIONS Take your time so you do not get dizzy or light-headed. If you are in pain, you may need to take or ask for pain medication before doing incentive spirometry. It is harder to take a deep breath if you are having pain. AFTER USE Rest and breathe slowly and easily. It can be helpful to keep track of a log of your progress. Your caregiver can provide you with a simple table to help with this. If you are using the spirometer at home, follow these instructions: SEEK MEDICAL CARE IF:  You are having difficultly using the spirometer. You have trouble using the spirometer as often as instructed. Your pain medication is not giving enough relief while using the spirometer. You develop fever of 100.5 F (38.1 C) or higher. SEEK IMMEDIATE MEDICAL CARE IF:  You cough up bloody sputum that had not been present before. You develop fever of 102 F (38.9 C) or greater. You develop worsening pain at or near the incision site. MAKE SURE YOU:  Understand these instructions. Will watch your condition. Will get help right away if you are not doing well or get worse. Document Released: 01/30/2007 Document Revised: 12/12/2011 Document Reviewed: 04/02/2007 [facility] Patient Information 2014 [place], [place].   ________________________________________________________________________

## 2024-03-07 ENCOUNTER — Encounter (HOSPITAL_COMMUNITY): Payer: Self-pay

## 2024-03-07 ENCOUNTER — Encounter (HOSPITAL_COMMUNITY)
Admission: RE | Admit: 2024-03-07 | Discharge: 2024-03-07 | Disposition: A | Discharge status: Home / Self Care | Source: Ambulatory Visit | Attending: Orthopedic Surgery | Admitting: Orthopedic Surgery

## 2024-03-07 ENCOUNTER — Other Ambulatory Visit: Payer: Self-pay

## 2024-03-07 VITALS — BP 154/98 | HR 71 | Temp 98.5°F | Resp 16 | Ht 66.0 in | Wt 215.4 lb

## 2024-03-07 DIAGNOSIS — Z01812 Encounter for preprocedural laboratory examination: Secondary | ICD-10-CM | POA: Diagnosis not present

## 2024-03-07 DIAGNOSIS — Z01818 Encounter for other preprocedural examination: Secondary | ICD-10-CM

## 2024-03-07 DIAGNOSIS — I10 Essential (primary) hypertension: Secondary | ICD-10-CM | POA: Diagnosis not present

## 2024-03-07 LAB — SURGICAL PCR SCREEN
MRSA, PCR: NEGATIVE
Staphylococcus aureus: POSITIVE — AB

## 2024-03-07 LAB — BASIC METABOLIC PANEL WITH GFR
Anion gap: 7 (ref 5–15)
BUN: 15 mg/dL (ref 6–20)
CO2: 22 mmol/L (ref 22–32)
Calcium: 8.7 mg/dL — ABNORMAL LOW (ref 8.9–10.3)
Chloride: 108 mmol/L (ref 98–111)
Creatinine, Ser: 0.59 mg/dL (ref 0.44–1.00)
GFR, Estimated: >60 mL/min (ref >60)
Glucose, Bld: 97 mg/dL (ref 70–99)
Potassium: 3.7 mmol/L (ref 3.5–5.1)
Sodium: 137 mmol/L (ref 135–145)

## 2024-03-07 LAB — TYPE AND SCREEN
ABO/RH(D): O POS
Antibody Screen: NEGATIVE

## 2024-03-07 LAB — CBC
HCT: 39.9 % (ref 36.0–46.0)
Hemoglobin: 13.2 g/dL (ref 12.0–15.0)
MCH: 29.1 pg (ref 26.0–34.0)
MCHC: 33.1 g/dL (ref 30.0–36.0)
MCV: 87.9 fL (ref 80.0–100.0)
Platelets: 223 10*3/uL (ref 150–400)
RBC: 4.54 MIL/uL (ref 3.87–5.11)
RDW: 14.4 % (ref 11.5–15.5)
WBC: 8.6 10*3/uL (ref 4.0–10.5)
nRBC: 0 % (ref 0.0–0.2)

## 2024-03-11 NOTE — H&P (Signed)
 HIP ARTHROPLASTY ADMISSION H&P  Patient ID: Renee Cross MRN: [id number] DOB/AGE: Sep 23, 1976 48 y.o.  Chief Complaint: {Left/right:3049041} hip pain.  Planned Procedure Date: *** Medical Clearance by ***   Cardiac Clearance by *** Additional clearance by ***   HPI: Renee Cross is a 48 y.o. female who presents for evaluation of OA LEFT HIP. The patient has a history of pain and functional disability in the {Left/right:3049041} hip due to arthritis and has failed non-surgical conservative treatments for greater than 12 weeks to include {nonsurgical conservative treatment (must select two):3049030}.  Onset of symptoms was {abrupt, gradual:20671}, starting {1->10 years:3049031} years ago with {stable, gradual worsening, rapidly worsening:3049032} course since that time. The patient noted {no past surgery, prior procedures:3049033} on the {Left/right:3049041} hip.  Patient currently rates pain at {1-10:3049035} out of 10 with activity. Patient has {night pain, worsening of pain with activity and weight bearing:3049036}.  Patient has evidence of {Radiographic or MRI evidence of (must document one of the below):3046104} by imaging studies.  There is no active infection.  Past Medical History:  Diagnosis Date   Abnormal uterine bleeding (AUB) 12/22/2022   Acute recurrent sinusitis 01/27/2021   Alcohol use disorder 01/14/2023   Amblyopia of left eye 12/22/2022   Anemia    Arthritis    Asthma 09/12/2023   B12 deficiency 01/14/2023   Bilateral knee pain 06/28/2023   Class 1 drug-induced obesity with serious comorbidity and body mass index (BMI) of 33.0 to 33.9 in adult 08/26/2023   Depression    Elevated rheumatoid factor 12/28/2021   Encounter for disability assessment 12/22/2022   Episode of recurrent major depressive disorder (HCC) 01/14/2023   Financial difficulty 01/14/2023   Gastroesophageal reflux disease 12/22/2022   Hiatal hernia with GERD    Hip arthritis 12/22/2022   History of  suicide attempt 01/14/2023   Hypertension    Iron deficiency anemia due to chronic blood loss 03/31/2022   Left groin mass 09/17/2020   MDD (major depressive disorder), recurrent episode, moderate (HCC) 08/23/2023   Microcytic hypochromic anemia 12/28/2021   Multiple allergies 09/12/2023   Nasal polyp 01/27/2021   Polypectomy over 20 years ago.     Non-recurrent acute suppurative otitis media of left ear without spontaneous rupture of tympanic membrane 05/16/2023   Osteoarthritis of both hips 01/21/2022   Osteoarthritis of right knee 07/02/2023   Overuse of medication 12/22/2022   Perennial allergic rhinitis with seasonal variation 12/22/2022   Pneumonia    Primary hypertension 12/22/2022   Primary osteoarthritis of both knees 01/21/2022   PTSD (post-traumatic stress disorder)    Rheumatoid arthritis (HCC) 12/22/2022   Screening for colon cancer 08/26/2023   Severe persistent asthma with exacerbation 12/22/2022   Sickle cell trait (HCC)    Tobacco use disorder 05/16/2023   Past Surgical History:  Procedure Laterality Date   HERNIA REPAIR     SINUS EXPLORATION     STOMACH SURGERY  10/03/2001   Possible fundiplication?   TONSILLECTOMY     TOTAL HIP ARTHROPLASTY Right 02/14/2023   Procedure: TOTAL HIP ARTHROPLASTY ANTERIOR APPROACH;  Surgeon: Saundra Curl, MD;  Location: [facility];  Service: Orthopedics;  Laterality: Right;   TUBAL LIGATION     No Known Allergies Prior to Admission medications   Medication Sig Start Date End Date Taking? Authorizing Provider  albuterol  (VENTOLIN  HFA) 108 (90 Base) MCG/ACT inhaler 2 PUFFS EVERY 4-6 HOURS AS NEEDED FOR COUGH OR WHEEZE. 11/10/23  Yes Kozlow, Rema Care, MD  Albuterol -Budesonide (AIRSUPRA ) 90-80 MCG/ACT AERO Inhale  2 puffs into the lungs every 4 (four) hours as needed (cough, wheezing). 10/20/23  Yes Catheryn Cluck, MD  cetirizine  (ZYRTEC ) 10 MG tablet Take 10 mg by mouth daily.   Yes [provider]  Cyanocobalamin  (VITAMIN  B12 PO) Take 1 tablet by mouth daily.   Yes [provider]  DULoxetine  (CYMBALTA ) 60 MG capsule Take 1 capsule (60 mg total) by mouth daily. 01/03/24 04/02/24 Yes Arfeen, Bronson Canny, MD  esomeprazole  (NEXIUM ) 40 MG capsule Take 1 capsule (40 mg total) by mouth every morning. 09/05/23  Yes Kozlow, Rema Care, MD  famotidine  (PEPCID ) 40 MG tablet Take 40 mg by mouth at bedtime.   Yes [provider]  ferrous sulfate 325 (65 FE) MG EC tablet Take 325 mg by mouth daily.   Yes [provider]  fluticasone  (FLONASE ) 50 MCG/ACT nasal spray Place 2 sprays into both nostrils every morning. 2 sprays per nostril every day for stuffy nose or drainage. 09/05/23  Yes Kozlow, Rema Care, MD  ipratropium-albuterol  (DUONEB) 0.5-2.5 (3) MG/3ML SOLN Take 3 mLs by nebulization every 4 (four) hours as needed. 10/20/23 03/07/24 Yes Catheryn Cluck, MD  loratadine  (CLARITIN ) 10 MG tablet Take 10 mg by mouth daily.   Yes [provider]  medroxyPROGESTERone (PROVERA) 10 MG tablet Take 10 mg by mouth daily. 09/06/23  Yes [provider]  meloxicam  (MOBIC ) 15 MG tablet Take 1 tablet (15 mg total) by mouth daily. For 2 weeks for pain and inflammation. Then take as needed Patient taking differently: Take 15 mg by mouth daily as needed for pain. 02/14/23  Yes McBane, Caroline N, PA-C  methocarbamol  (ROBAXIN ) 500 MG tablet Take 1 tablet (500 mg total) by mouth every 8 (eight) hours as needed for muscle spasms. 08/25/23  Yes Catheryn Cluck, MD  mometasone  (ELOCON ) 0.1 % ointment Apply topically daily. Apply once daily to red itchy areas or until resolved 09/05/23  Yes Kozlow, Rema Care, MD  mometasone -formoterol  (DULERA ) 200-5 MCG/ACT AERO Inhale 2 puffs into the lungs 2 (two) times daily. 11/07/23  Yes Kozlow, Rema Care, MD  montelukast  (SINGULAIR ) 10 MG tablet TAKE 1 TABLET BY MOUTH EVERYDAY AT BEDTIME 11/06/23  Yes Catheryn Cluck, MD  Multiple Vitamin (MULTIVITAMIN WITH MINERALS) TABS tablet Take 1 tablet by  mouth daily.   Yes [provider]  Olmesartan -amLODIPine -HCTZ 40-10-25 MG TABS Take 1 tablet by mouth daily. 06/27/23 06/21/24 Yes Catheryn Cluck, MD  Olopatadine  HCl (PATADAY ) 0.2 % SOLN Place 1 drop into both eyes daily as needed (allergies). 02/21/23  Yes Kozlow, Rema Care, MD  QUEtiapine  (SEROQUEL ) 100 MG tablet Take 1 tablet (100 mg total) by mouth at bedtime. 01/03/24 04/02/24 Yes Arfeen, Bronson Canny, MD  Semaglutide -Weight Management (WEGOVY ) 0.5 MG/0.5ML SOAJ Inject 0.5 mg into the skin once a week. 01/03/24 03/27/24 Yes Catheryn Cluck, MD  tezepelumab -ekko (TEZSPIRE ) 210 MG/1. syringe Inject 1.91 mLs (210 mg total) into the skin every 28 (twenty-eight) days. 10/23/23  Yes Kozlow, Rema Care, MD   Social History   Socioeconomic History   Marital status: Single    Spouse name: Not on file   Number of children: Not on file   Years of education: Not on file   Highest education level: Not on file  Occupational History   Not on file  Tobacco Use   Smoking status: Former    Types: Cigarettes    Passive exposure: Never   Smokeless tobacco: Never   Tobacco comments:  Smokes black and mild occassionally  Vaping Use   Vaping status: Never Used  Substance and Sexual Activity   Alcohol use: Not Currently    Alcohol/week: 2.0 standard drinks of alcohol    Types: 2 Cans of beer per week   Drug use: Yes    Frequency: 4.0 times per week    Types: Marijuana    Comment: edibles   Sexual activity: Yes    Birth control/protection: Surgical  Other Topics Concern   Not on file  Social History Narrative   Caffiene 2 days / week 2 cans soda   Works not working.    Social Drivers of Corporate investment banker Strain: Not on file  Food Insecurity: Low Risk  (09/28/2023)   Received from [facility]   Hunger Vital Sign    Worried About Running Out of Food in the Last Year: Never true    Ran Out of Food in the Last Year: Never true  Transportation Needs: No Transportation Needs  (09/28/2023)   Received from [facility]    In the past 12 months, has lack of reliable transportation kept you from medical appointments, meetings, work or from getting things needed for daily living? : No  Physical Activity: Not on file  Stress: Not on file  Social Connections: Not on file   Family History  Problem Relation Age of Onset   Hypertension Mother    Cancer Maternal Aunt    Heart disease Neg Hx     ROS: Currently denies lightheadedness, dizziness, Fever, chills, CP, SOB.***   No personal history of DVT, PE, MI, or CVA.*** No loose teeth or dentures*** All other systems have been reviewed and were otherwise currently negative with the exception of those mentioned in the HPI and as above.  Objective: Vitals: Ht: *** Wt: *** lbs Temp: *** BP: *** Pulse: *** O2 ***% on room air.   Physical Exam: General: Alert, NAD. Trendelenberg Gait *** HEENT: EOMI, Good Neck Extension *** Pulm: No increased work of breathing.  Clear B/L A/P w/o crackle or wheeze. *** CV: RRR, No m/g/r appreciated *** GI: soft, NT, ND. BS x 4 quadrants Neuro: CN II-XII grossly intact without focal deficit.  Sensation intact distally Skin: No lesions in the area of chief complaint MSK/Surgical Site: *** TTP. Hip pain with ROM. + Stinchfield. + SLR. + FABER/FADIR. 5/5 strength.  NVI.    Imaging Review Plain radiographs demonstrate {mild/mod/severe:3049053} degenerative joint disease of the {left/right/bi:30031} hip.   The bone quality appears to be {good/fair/poor/excellent:33178} for age and reported activity level.  Preoperative templating of the joint replacement has been completed, documented, and submitted to the Operating Room personnel in order to optimize intra-operative equipment management.  Assessment: OA LEFT HIP Active Problems:   * No active hospital problems. *   Plan: Plan for Procedure(s): ARTHROPLASTY, HIP, TOTAL, ANTERIOR APPROACH  The patient history,  physical exam, clinical judgement of the provider and imaging are consistent with end stage degenerative joint disease and total joint arthroplasty is deemed medically necessary. The treatment options including medical management, injection therapy, and arthroplasty were discussed at length. The risks and benefits of Procedure(s): ARTHROPLASTY, HIP, TOTAL, ANTERIOR APPROACH were presented and reviewed.  The risks of nonoperative treatment, versus surgical intervention including but not limited to continued pain, aseptic loosening, stiffness, dislocation/subluxation, infection, bleeding, nerve injury, blood clots, cardiopulmonary complications, morbidity, mortality, among others were discussed. The patient verbalizes understanding and wishes to proceed with the plan.  Patient is being admitted for surgery, pain control, PT, prophylactic antibiotics, VTE prophylaxis, progressive ambulation, ADL's and discharge planning. He *** She will spend the night in observation.  Dental prophylaxis discussed and recommended for 2 years postoperatively.***  The patient does not*** meet the criteria for TXA which will be used perioperatively.   ASA 81 mg BID ***ASA 325 mg ***Xarelto  will be used postoperatively for DVT prophylaxis in addition to SCDs, and early ambulation. Plan for Oxycodone ***, Celebrex, Tylenol  for pain.   Robaxin ***Baclofen for muscle spasm.  Zofran  for nausea and vomiting. ***Colace Miralax Senokot is for constipation prevention. Pharmacy- *** The patient is planning to be discharged home with OPPT*** HHPT*** and into the care of *** who can be reached at *** Follow up appt ***  {ORTHOADMISSIONSTATUS:21269}   Ander Bame Office [phone] 03/11/2024 1:02 PM

## 2024-03-13 ENCOUNTER — Telehealth: Payer: Self-pay | Admitting: Allergy and Immunology

## 2024-03-13 MED ORDER — LORATADINE 10 MG PO TABS: 10.0000 mg | ORAL_TABLET | Freq: Every day | ORAL | 0 refills | Status: DC

## 2024-03-13 MED ORDER — ALBUTEROL SULFATE HFA 108 (90 BASE) MCG/ACT IN AERS
2.0000 | INHALATION_SPRAY | RESPIRATORY_TRACT | 0 refills | Status: DC | PRN
Start: 2024-03-13 — End: 2024-04-04

## 2024-03-13 NOTE — Telephone Encounter (Signed)
 Patient called and stated that she needs refills on her Albuterol  inhaler and her Loratadine . Patient stated something about needing refills before her surgery. It was hard to understand her as she sounded very groggy and not saying her words correctly. Patients pharmacy is [facility] [address] in [place]. Patients call back number is [phone]

## 2024-03-13 NOTE — Telephone Encounter (Signed)
 I called the patient and left a message to callback to inform. Medications have been sent in.

## 2024-03-14 DIAGNOSIS — Z419 Encounter for procedure for purposes other than remedying health state, unspecified: Secondary | ICD-10-CM | POA: Diagnosis not present

## 2024-03-19 ENCOUNTER — Ambulatory Visit (HOSPITAL_BASED_OUTPATIENT_CLINIC_OR_DEPARTMENT_OTHER): Payer: Self-pay | Admitting: Anesthesiology

## 2024-03-19 ENCOUNTER — Encounter (HOSPITAL_COMMUNITY): Payer: Self-pay | Admitting: Orthopedic Surgery

## 2024-03-19 ENCOUNTER — Encounter (HOSPITAL_COMMUNITY)
Admission: RE | Disposition: A | Discharge status: Home / Self Care | Payer: Self-pay | Source: Home / Self Care | Attending: Orthopedic Surgery

## 2024-03-19 ENCOUNTER — Ambulatory Visit (HOSPITAL_COMMUNITY)
Admission: RE | Admit: 2024-03-19 | Discharge: 2024-03-19 | Disposition: A | Discharge status: Home / Self Care | Attending: Orthopedic Surgery | Admitting: Orthopedic Surgery

## 2024-03-19 ENCOUNTER — Other Ambulatory Visit: Payer: Self-pay

## 2024-03-19 ENCOUNTER — Ambulatory Visit (HOSPITAL_COMMUNITY): Payer: Self-pay | Admitting: Anesthesiology

## 2024-03-19 ENCOUNTER — Ambulatory Visit (HOSPITAL_COMMUNITY)

## 2024-03-19 ENCOUNTER — Other Ambulatory Visit (HOSPITAL_COMMUNITY): Payer: Self-pay

## 2024-03-19 DIAGNOSIS — M1612 Unilateral primary osteoarthritis, left hip: Secondary | ICD-10-CM

## 2024-03-19 DIAGNOSIS — I10 Essential (primary) hypertension: Secondary | ICD-10-CM | POA: Insufficient documentation

## 2024-03-19 DIAGNOSIS — J4550 Severe persistent asthma, uncomplicated: Secondary | ICD-10-CM | POA: Insufficient documentation

## 2024-03-19 DIAGNOSIS — Z79899 Other long term (current) drug therapy: Secondary | ICD-10-CM | POA: Diagnosis not present

## 2024-03-19 DIAGNOSIS — Z793 Long term (current) use of hormonal contraceptives: Secondary | ICD-10-CM | POA: Diagnosis not present

## 2024-03-19 DIAGNOSIS — K449 Diaphragmatic hernia without obstruction or gangrene: Secondary | ICD-10-CM | POA: Insufficient documentation

## 2024-03-19 DIAGNOSIS — Z791 Long term (current) use of non-steroidal anti-inflammatories (NSAID): Secondary | ICD-10-CM | POA: Insufficient documentation

## 2024-03-19 DIAGNOSIS — D649 Anemia, unspecified: Secondary | ICD-10-CM | POA: Diagnosis not present

## 2024-03-19 DIAGNOSIS — Z7951 Long term (current) use of inhaled steroids: Secondary | ICD-10-CM | POA: Diagnosis not present

## 2024-03-19 DIAGNOSIS — D573 Sickle-cell trait: Secondary | ICD-10-CM | POA: Diagnosis not present

## 2024-03-19 DIAGNOSIS — Z96643 Presence of artificial hip joint, bilateral: Secondary | ICD-10-CM | POA: Diagnosis not present

## 2024-03-19 DIAGNOSIS — Z96642 Presence of left artificial hip joint: Secondary | ICD-10-CM | POA: Insufficient documentation

## 2024-03-19 HISTORY — PX: TOTAL HIP ARTHROPLASTY: SHX124

## 2024-03-19 LAB — POCT PREGNANCY, URINE: Preg Test, Ur: NEGATIVE

## 2024-03-19 SURGERY — ARTHROPLASTY, HIP, TOTAL, ANTERIOR APPROACH
Anesthesia: Monitor Anesthesia Care | Site: Hip | Laterality: Left

## 2024-03-19 MED ORDER — ONDANSETRON HCL 4 MG/2ML IJ SOLN
INTRAMUSCULAR | Status: AC
  Filled 2024-03-19: qty 2

## 2024-03-19 MED ORDER — POVIDONE-IODINE 10 % EX SWAB
2.0000 | Freq: Once | CUTANEOUS | Status: AC
  Administered 2024-03-19: 2 via TOPICAL

## 2024-03-19 MED ORDER — METHOCARBAMOL 500 MG PO TABS
500.0000 mg | ORAL_TABLET | Freq: Four times a day (QID) | ORAL | Status: DC | PRN
  Administered 2024-03-19: 500 mg via ORAL

## 2024-03-19 MED ORDER — CHLORHEXIDINE GLUCONATE 0.12 % MT SOLN
15.0000 mL | Freq: Once | OROMUCOSAL | Status: AC
  Administered 2024-03-19: 15 mL via OROMUCOSAL

## 2024-03-19 MED ORDER — POVIDONE-IODINE 10 % EX SWAB: 2.0000 | Freq: Once | CUTANEOUS | Status: DC

## 2024-03-19 MED ORDER — TRANEXAMIC ACID-NACL 1000-0.7 MG/100ML-% IV SOLN
1000.0000 mg | INTRAVENOUS | Status: AC
  Administered 2024-03-19: 1000 mg via INTRAVENOUS
  Filled 2024-03-19: qty 100

## 2024-03-19 MED ORDER — ACETAMINOPHEN 325 MG PO TABS
ORAL_TABLET | ORAL | Status: AC
  Filled 2024-03-19: qty 3

## 2024-03-19 MED ORDER — METOCLOPRAMIDE HCL 5 MG PO TABS: 5.0000 mg | ORAL_TABLET | Freq: Three times a day (TID) | ORAL | Status: DC | PRN

## 2024-03-19 MED ORDER — HYDROMORPHONE HCL 1 MG/ML IJ SOLN
INTRAMUSCULAR | Status: AC
Start: 2024-03-19 — End: 2024-03-19
  Filled 2024-03-19: qty 1

## 2024-03-19 MED ORDER — OXYCODONE HCL 5 MG PO TABS
ORAL_TABLET | ORAL | Status: AC
  Filled 2024-03-19: qty 1

## 2024-03-19 MED ORDER — ONDANSETRON HCL 4 MG/2ML IJ SOLN: 4.0000 mg | Freq: Four times a day (QID) | INTRAMUSCULAR | Status: DC | PRN

## 2024-03-19 MED ORDER — FENTANYL CITRATE (PF) 100 MCG/2ML IJ SOLN
INTRAMUSCULAR | Status: AC
Start: 2024-03-19 — End: 2024-03-19
  Filled 2024-03-19: qty 2

## 2024-03-19 MED ORDER — ACETAMINOPHEN 500 MG PO TABS
1000.0000 mg | ORAL_TABLET | Freq: Four times a day (QID) | ORAL | Status: DC
  Administered 2024-03-19: 1000 mg via ORAL

## 2024-03-19 MED ORDER — ONDANSETRON HCL 4 MG PO TABS: 4.0000 mg | ORAL_TABLET | Freq: Four times a day (QID) | ORAL | Status: DC | PRN

## 2024-03-19 MED ORDER — OXYCODONE HCL 5 MG PO TABS: 5.0000 mg | ORAL_TABLET | ORAL | Status: DC | PRN

## 2024-03-19 MED ORDER — AMISULPRIDE (ANTIEMETIC) 5 MG/2ML IV SOLN: 10.0000 mg | Freq: Once | INTRAVENOUS | Status: DC | PRN

## 2024-03-19 MED ORDER — FENTANYL CITRATE PF 50 MCG/ML IJ SOSY
PREFILLED_SYRINGE | INTRAMUSCULAR | Status: AC
  Filled 2024-03-19: qty 1

## 2024-03-19 MED ORDER — LACTATED RINGERS IV BOLUS
250.0000 mL | Freq: Once | INTRAVENOUS | Status: AC
  Administered 2024-03-19: 250 mL via INTRAVENOUS

## 2024-03-19 MED ORDER — MIDAZOLAM HCL 2 MG/2ML IJ SOLN
INTRAMUSCULAR | Status: AC
Start: 2024-03-19 — End: 2024-03-19
  Filled 2024-03-19: qty 2

## 2024-03-19 MED ORDER — LACTATED RINGERS IV SOLN: INTRAVENOUS | Status: DC

## 2024-03-19 MED ORDER — CEFAZOLIN SODIUM-DEXTROSE 2-4 GM/100ML-% IV SOLN: 2.0000 g | Freq: Four times a day (QID) | INTRAVENOUS | Status: DC

## 2024-03-19 MED ORDER — OXYCODONE HCL 5 MG PO TABS
5.0000 mg | ORAL_TABLET | Freq: Once | ORAL | Status: AC | PRN
  Administered 2024-03-19: 5 mg via ORAL

## 2024-03-19 MED ORDER — OXYCODONE HCL 5 MG/5ML PO SOLN: 5.0000 mg | Freq: Once | ORAL | Status: AC | PRN

## 2024-03-19 MED ORDER — DEXAMETHASONE SODIUM PHOSPHATE 10 MG/ML IJ SOLN
8.0000 mg | Freq: Once | INTRAMUSCULAR | Status: AC
Start: 2024-03-19 — End: 2024-03-19
  Administered 2024-03-19: 8 mg via INTRAVENOUS

## 2024-03-19 MED ORDER — BUPIVACAINE LIPOSOME 1.3 % IJ SUSP
INTRAMUSCULAR | Status: AC
  Filled 2024-03-19: qty 10

## 2024-03-19 MED ORDER — METHOCARBAMOL 500 MG PO TABS
ORAL_TABLET | ORAL | Status: AC
  Filled 2024-03-19: qty 1

## 2024-03-19 MED ORDER — PROPOFOL 1000 MG/100ML IV EMUL
INTRAVENOUS | Status: AC
  Filled 2024-03-19: qty 100

## 2024-03-19 MED ORDER — BUPIVACAINE LIPOSOME 1.3 % IJ SUSP: 10.0000 mL | Freq: Once | INTRAMUSCULAR | Status: DC

## 2024-03-19 MED ORDER — CHLORHEXIDINE GLUCONATE 4 % EX SOLN
1.0000 | CUTANEOUS | 1 refills | Status: AC
  Filled 2024-03-19 (×2): qty 946, 30d supply, fill #0

## 2024-03-19 MED ORDER — TRANEXAMIC ACID-NACL 1000-0.7 MG/100ML-% IV SOLN
1000.0000 mg | Freq: Once | INTRAVENOUS | Status: AC
  Administered 2024-03-19: 1000 mg via INTRAVENOUS

## 2024-03-19 MED ORDER — OXYCODONE HCL 5 MG PO TABS
10.0000 mg | ORAL_TABLET | ORAL | Status: DC | PRN
  Administered 2024-03-19: 5 mg via ORAL

## 2024-03-19 MED ORDER — ONDANSETRON HCL 4 MG/2ML IJ SOLN
INTRAMUSCULAR | Status: DC | PRN
  Administered 2024-03-19: 4 mg via INTRAVENOUS

## 2024-03-19 MED ORDER — FENTANYL CITRATE (PF) 100 MCG/2ML IJ SOLN
INTRAMUSCULAR | Status: DC | PRN
  Administered 2024-03-19 (×2): 50 ug via INTRAVENOUS

## 2024-03-19 MED ORDER — METHOCARBAMOL 1000 MG/10ML IJ SOLN: 500.0000 mg | Freq: Four times a day (QID) | INTRAMUSCULAR | Status: DC | PRN

## 2024-03-19 MED ORDER — HYDROMORPHONE HCL 1 MG/ML IJ SOLN
0.5000 mg | INTRAMUSCULAR | Status: DC | PRN
  Administered 2024-03-19 (×2): 0.5 mg via INTRAVENOUS

## 2024-03-19 MED ORDER — BUPIVACAINE IN DEXTROSE 0.75-8.25 % IT SOLN
INTRATHECAL | Status: DC | PRN
  Administered 2024-03-19: 1.8 mL via INTRATHECAL

## 2024-03-19 MED ORDER — SODIUM CHLORIDE (PF) 0.9 % IJ SOLN
INTRAMUSCULAR | Status: AC
  Filled 2024-03-19: qty 20

## 2024-03-19 MED ORDER — DEXAMETHASONE SODIUM PHOSPHATE 10 MG/ML IJ SOLN
INTRAMUSCULAR | Status: AC
  Filled 2024-03-19: qty 1

## 2024-03-19 MED ORDER — MIDAZOLAM HCL 5 MG/5ML IJ SOLN
INTRAMUSCULAR | Status: DC | PRN
  Administered 2024-03-19 (×2): 1 mg via INTRAVENOUS

## 2024-03-19 MED ORDER — METOCLOPRAMIDE HCL 5 MG/ML IJ SOLN: 5.0000 mg | Freq: Three times a day (TID) | INTRAMUSCULAR | Status: DC | PRN

## 2024-03-19 MED ORDER — ORAL CARE MOUTH RINSE
15.0000 mL | Freq: Once | OROMUCOSAL | Status: AC
Start: 2024-03-19 — End: 2024-03-19

## 2024-03-19 MED ORDER — 0.9 % SODIUM CHLORIDE (POUR BTL) OPTIME
TOPICAL | Status: DC | PRN
  Administered 2024-03-19: 1000 mL

## 2024-03-19 MED ORDER — FENTANYL CITRATE PF 50 MCG/ML IJ SOSY
25.0000 ug | PREFILLED_SYRINGE | INTRAMUSCULAR | Status: DC | PRN
  Administered 2024-03-19 (×2): 50 ug via INTRAVENOUS

## 2024-03-19 MED ORDER — ACETAMINOPHEN 500 MG PO TABS
1000.0000 mg | ORAL_TABLET | Freq: Once | ORAL | Status: AC
  Administered 2024-03-19: 1000 mg via ORAL
  Filled 2024-03-19: qty 2

## 2024-03-19 MED ORDER — CEFAZOLIN SODIUM-DEXTROSE 2-4 GM/100ML-% IV SOLN
2.0000 g | INTRAVENOUS | Status: AC
  Administered 2024-03-19: 2 g via INTRAVENOUS
  Filled 2024-03-19: qty 100

## 2024-03-19 MED ORDER — LACTATED RINGERS IV BOLUS
500.0000 mL | Freq: Once | INTRAVENOUS | Status: AC
  Administered 2024-03-19: 500 mL via INTRAVENOUS

## 2024-03-19 MED ORDER — TRANEXAMIC ACID-NACL 1000-0.7 MG/100ML-% IV SOLN
INTRAVENOUS | Status: AC
  Filled 2024-03-19: qty 100

## 2024-03-19 MED ORDER — PROPOFOL 10 MG/ML IV BOLUS
INTRAVENOUS | Status: DC | PRN
  Administered 2024-03-19: 20 mg via INTRAVENOUS
  Administered 2024-03-19: 50 mg via INTRAVENOUS
  Administered 2024-03-19: 30 mg via INTRAVENOUS

## 2024-03-19 MED ORDER — MUPIROCIN 2 % EX OINT
1.0000 | TOPICAL_OINTMENT | Freq: Two times a day (BID) | CUTANEOUS | 0 refills | Status: AC
  Filled 2024-03-19: qty 44, 25d supply, fill #0
  Filled 2024-03-19: qty 66, 30d supply, fill #0

## 2024-03-19 MED ORDER — PROPOFOL 500 MG/50ML IV EMUL
INTRAVENOUS | Status: DC | PRN
  Administered 2024-03-19: 90 ug/kg/min via INTRAVENOUS

## 2024-03-19 MED ORDER — PROPOFOL 10 MG/ML IV BOLUS
INTRAVENOUS | Status: AC
  Filled 2024-03-19: qty 20

## 2024-03-19 MED ORDER — SODIUM CHLORIDE 0.9 % IV SOLN
INTRAVENOUS | Status: DC | PRN
  Administered 2024-03-19: 30 mL

## 2024-03-19 SURGICAL SUPPLY — 44 items
BAG COUNTER SPONGE SURGICOUNT (BAG) IMPLANT
BAG ZIPLOCK 12X15 (MISCELLANEOUS) IMPLANT
BIT DRILL TRIDENT 4X25 SU (BIT) IMPLANT
BLADE SAG 18X100X1.27 (BLADE) ×1 IMPLANT
CHLORAPREP W/TINT 26 (MISCELLANEOUS) ×1 IMPLANT
CLSR STERI-STRIP ANTIMIC 1/2X4 (GAUZE/BANDAGES/DRESSINGS) ×1 IMPLANT
COVER PERINEAL POST (MISCELLANEOUS) ×1 IMPLANT
COVER SURGICAL LIGHT HANDLE (MISCELLANEOUS) ×1 IMPLANT
DRAPE C-ARM 42X120 X-RAY (DRAPES) IMPLANT
DRAPE IMP U-DRAPE 54X76 (DRAPES) ×1 IMPLANT
DRAPE STERI IOBAN 125X83 (DRAPES) ×1 IMPLANT
DRAPE U-SHAPE 47X51 STRL (DRAPES) ×2 IMPLANT
DRSG MEPILEX POST OP 4X8 (GAUZE/BANDAGES/DRESSINGS) ×1 IMPLANT
ELECT PENCIL ROCKER SW 15FT (MISCELLANEOUS) ×1 IMPLANT
ELECT REM PT RETURN 15FT ADLT (MISCELLANEOUS) ×1 IMPLANT
FACESHIELD WRAPAROUND (MASK) ×1 IMPLANT
FACESHIELD WRAPAROUND OR TEAM (MASK) IMPLANT
GLOVE BIO SURGEON STRL SZ7.5 (GLOVE) ×1 IMPLANT
GLOVE BIOGEL PI IND STRL 7.5 (GLOVE) ×1 IMPLANT
GLOVE BIOGEL PI IND STRL 8 (GLOVE) ×1 IMPLANT
GLOVE SURG SYN 7.0 (GLOVE) ×1 IMPLANT
GLOVE SURG SYN 7.0 PF PI (GLOVE) ×1 IMPLANT
GOWN STRL REUS W/ TWL LRG LVL3 (GOWN DISPOSABLE) ×1 IMPLANT
GOWN STRL REUS W/ TWL XL LVL3 (GOWN DISPOSABLE) ×1 IMPLANT
HEAD BIOLOX HIP 36/-5 (Joint) IMPLANT
HOLDER FOLEY CATH W/STRAP (MISCELLANEOUS) IMPLANT
INSERT 0 DEGREE 36 (Miscellaneous) IMPLANT
KIT TURNOVER KIT A (KITS) ×2 IMPLANT
MANIFOLD NEPTUNE II (INSTRUMENTS) ×1 IMPLANT
NS IRRIG 1000ML POUR BTL (IV SOLUTION) ×1 IMPLANT
PACK ANTERIOR HIP CUSTOM (KITS) ×1 IMPLANT
PROTECTOR NERVE ULNAR (MISCELLANEOUS) ×1 IMPLANT
SCREW HEX LP 6.5X20 (Screw) IMPLANT
SHELL TRIDENT II CLUST 50 (Shell) IMPLANT
SPIKE FLUID TRANSFER (MISCELLANEOUS) ×2 IMPLANT
STEM STD OFFSET SZ4 32.5 (Stem) IMPLANT
SUT MNCRL AB 3-0 PS2 18 (SUTURE) ×1 IMPLANT
SUT VIC AB 0 CT1 36 (SUTURE) ×1 IMPLANT
SUT VIC AB 1 CT1 36 (SUTURE) ×1 IMPLANT
SUT VIC AB 2-0 CT1 TAPERPNT 27 (SUTURE) ×1 IMPLANT
TRAY FOLEY MTR SLVR 14FR STAT (SET/KITS/TRAYS/PACK) IMPLANT
TRAY FOLEY MTR SLVR 16FR STAT (SET/KITS/TRAYS/PACK) ×1 IMPLANT
TUBE SUCTION HIGH CAP CLEAR NV (SUCTIONS) ×1 IMPLANT
WATER STERILE IRR 1000ML POUR (IV SOLUTION) ×2 IMPLANT

## 2024-03-19 NOTE — Discharge Instructions (Addendum)

## 2024-03-19 NOTE — Evaluation (Signed)
 Physical Therapy Evaluation Patient Details Name: Renee Cross MRN: [id number] DOB: [birth year] Today's Date: 03/19/2024  History of Present Illness  48 yo female presents to therapy s/p L THA, anterior approach on 03/19/2024 due to failure of conservative measures. Pt PMH includes but is not limited to: anemia, asthma, GERD, HTN, sickle cell trait and  R THA, anterior approach on 02/14/2023.  Clinical Impression     Renee Cross is a 48 y.o. female POD 0 s/p L THA. Patient reports mod I with mobility at baseline. Patient is now limited by functional impairments (see PT problem list below) and requires CGA for transfers and gait with RW. Patient was able to ambulate 45 and 40 feet with RW and CGA and cues for safe walker management. Patient educated on safe sequencing for functional mobility tasks, fall risk prevention, use of RW, CP/ice, pain management and goal and car transfers pt and friend  verbalized understanding of safe guarding position for people assisting with mobility. Patient instructed in exercises to facilitate ROM and circulation reviewed and HO provided. Patient will benefit from continued skilled PT interventions to address impairments and progress towards PLOF. Patient has met mobility goals at adequate level for discharge home; will continue to follow if pt continues acute stay to progress towards Mod I goals with family and social support participating with OPPT service.       If plan is discharge home, recommend the following: A little help with walking and/or transfers;A little help with bathing/dressing/bathroom;Assist for transportation;Help with stairs or ramp for entrance;Assistance with cooking/housework   Can travel by private vehicle        Equipment Recommendations None recommended by PT  Recommendations for Other Services       Functional Status Assessment Patient has had a recent decline in their functional status and demonstrates the ability to make  significant improvements in function in a reasonable and predictable amount of time.     Precautions / Restrictions Precautions Precautions: Fall Restrictions Weight Bearing Restrictions Per Provider Order: No      Mobility  Bed Mobility Overal bed mobility: Needs Assistance Bed Mobility: Supine to Sit     Supine to sit: Supervision     General bed mobility comments: min cues    Transfers Overall transfer level: Needs assistance Equipment used: Rolling walker (2 wheels) Transfers: Sit to/from Stand Sit to Stand: Contact guard assist           General transfer comment: min cues for safety and RW placement for bed, recliner and commode    Ambulation/Gait Ambulation/Gait assistance: Contact guard assist Gait Distance (Feet): 45 Feet Assistive device: Rolling walker (2 wheels) Gait Pattern/deviations: Step-to pattern, Antalgic, Trunk flexed, Decreased stance time - left Gait velocity: decreased     General Gait Details: min cues for safety and LE placement with pt reporting decreased B distal LE sensation once in standing with heavy reliance on B UE support at [employer] Mobility     Tilt Bed    Modified Rankin (Stroke Patients Only)       Balance Overall balance assessment: Needs assistance Sitting-balance support: Feet supported Sitting balance-Leahy Scale: Good     Standing balance support: Bilateral upper extremity supported, During functional activity, Reliant on assistive device for balance Standing balance-Leahy Scale: Fair Standing balance comment: static standing no UE support  Pertinent Vitals/Pain Pain Assessment Pain Assessment: 0-10 Pain Score: 6  Pain Location: L hip and LE Pain Descriptors / Indicators: Aching, Constant, Discomfort, Grimacing, Operative site guarding Pain Intervention(s): Limited activity within patient's tolerance, Monitored during session,  Premedicated before session, Repositioned, Ice applied    Home Living Family/patient expects to be discharged to:: Private residence Living Arrangements: Children (pt reports children may not be able to assist, may need help with showering) Available Help at Discharge: Family Type of Home: House Home Access: Level entry       Home Layout: One level Home Equipment: Cane - single Librarian, academic (2 wheels)      Prior Function Prior Level of Function : Independent/Modified Independent             Mobility Comments: mod I with SPC for all mobility tasks, IADLs, self care and ADLs       Extremity/Trunk Assessment        Lower Extremity Assessment Lower Extremity Assessment: LLE deficits/detail LLE Deficits / Details: ankle DF/PF 5/5 LLE Sensation: decreased light touch;decreased proprioception    Cervical / Trunk Assessment Cervical / Trunk Assessment: Normal  Communication   Communication Communication: No apparent difficulties    Cognition Arousal: Alert Behavior During Therapy: WFL for tasks assessed/performed   PT - Cognitive impairments: No apparent impairments                         Following commands: Intact       Cueing       General Comments      Exercises Total Joint Exercises Ankle Circles/Pumps: AROM, Both, 10 reps Quad Sets: AROM, Left, 5 reps Heel Slides: AROM, Left, 5 reps Hip ABduction/ADduction: AROM, Left, 5 reps, Standing Long Arc Quad: AROM, Left, 5 reps, Seated Knee Flexion: AROM, Left, 5 reps, Standing Standing Hip Extension: AROM, Left, 5 reps, Standing   Assessment/Plan    PT Assessment Patient needs continued PT services  PT Problem List Decreased strength;Decreased range of motion;Decreased activity tolerance;Decreased balance;Decreased mobility;Pain       PT Treatment Interventions DME instruction;Gait training;Functional mobility training;Therapeutic activities;Therapeutic exercise;Balance  training;Neuromuscular re-education;Modalities;Patient/family education    PT Goals (Current goals can be found in the Care Plan section)  Acute Rehab PT Goals Patient Stated Goal: to be able to walk without pain PT Goal Formulation: With patient Time For Goal Achievement: 04/02/24 Potential to Achieve Goals: Good    Frequency 7X/week     Co-evaluation               AM-PAC PT 6 Clicks Mobility  Outcome Measure Help needed turning from your back to your side while in a flat bed without using bedrails?: None Help needed moving from lying on your back to sitting on the side of a flat bed without using bedrails?: A Little Help needed moving to and from a bed to a chair (including a wheelchair)?: A Little Help needed standing up from a chair using your arms (e.g., wheelchair or bedside chair)?: A Little Help needed to walk in hospital room?: A Little Help needed climbing 3-5 steps with a railing? : A Little 6 Click Score: 19    End of Session Equipment Utilized During Treatment: Gait belt Activity Tolerance: No increased pain;Patient tolerated treatment well Patient left: in chair;with call bell/phone within reach;with family/visitor present Nurse Communication: Mobility status;Other (comment) (pt readiness for same day d/c from PT perspective) PT Visit Diagnosis: Unsteadiness on feet (R26.81);Other abnormalities of gait  and mobility (R26.89);Muscle weakness (generalized) (M62.81);Difficulty in walking, not elsewhere classified (R26.2);Pain Pain - Right/Left: Left Pain - part of body: Knee;Leg;Hip    Time: 1202-1243 PT Time Calculation (min) (ACUTE ONLY): 41 min   Charges:   PT Evaluation $PT Eval Low Complexity: 1 Low PT Treatments $Gait Training: 8-22 mins $Therapeutic Exercise: 8-22 mins PT General Charges $$ ACUTE PT VISIT: 1 Visit         Cary Clarks, PT Acute Rehab   Annalee Kiang 03/19/2024, 1:00 PM

## 2024-03-19 NOTE — Anesthesia Preprocedure Evaluation (Signed)
 Anesthesia Evaluation  Patient identified by MRN, date of birth, ID band Patient awake    Reviewed: Allergy  & Precautions, NPO status , Patient's Chart, lab work & pertinent test results  Airway Mallampati: II  TM Distance: >3 FB Neck ROM: Full    Dental   Pulmonary asthma , Patient abstained from smoking., former smoker   Pulmonary exam normal        Cardiovascular hypertension,  Rhythm:Regular Rate:Normal     Neuro/Psych negative neurological ROS     GI/Hepatic Neg liver ROS, hiatal hernia,GERD  ,,  Endo/Other  negative endocrine ROS    Renal/GU negative Renal ROS     Musculoskeletal   Abdominal   Peds  Hematology  (+) Blood dyscrasia, Sickle cell trait and anemia   Anesthesia Other Findings   Reproductive/Obstetrics                             Anesthesia Physical Anesthesia Plan  ASA: 2  Anesthesia Plan: Spinal and MAC   Post-op Pain Management: Ofirmev  IV (intra-op)*   Induction:   PONV Risk Score and Plan: 2 and Propofol  infusion, Dexamethasone , Ondansetron , Midazolam  and Treatment may vary due to age or medical condition  Airway Management Planned: Natural Airway and Simple Face Mask  Additional Equipment:   Intra-op Plan:   Post-operative Plan:   Informed Consent: I have reviewed the patients History and Physical, chart, labs and discussed the procedure including the risks, benefits and alternatives for the proposed anesthesia with the patient or authorized representative who has indicated his/her understanding and acceptance.     Dental advisory given  Plan Discussed with: CRNA  Anesthesia Plan Comments:        Anesthesia Quick Evaluation

## 2024-03-19 NOTE — Interval H&P Note (Signed)
 History and Physical Interval Note:  03/19/2024 7:01 AM  Renee Cross  has presented today for surgery, with the diagnosis of OA LEFT HIP.  The various methods of treatment have been discussed with the patient and family. After consideration of risks, benefits and other options for treatment, the patient has consented to  Procedure(s): ARTHROPLASTY, HIP, TOTAL, ANTERIOR APPROACH (Left) as a surgical intervention.  The patient's history has been reviewed, patient examined, no change in status, stable for surgery.  I have reviewed the patient's chart and labs.  Questions were answered to the patient's satisfaction.     Saundra Curl

## 2024-03-19 NOTE — Transfer of Care (Signed)
 Immediate Anesthesia Transfer of Care Note  [name]  Procedure(s) Performed: ARTHROPLASTY, HIP, TOTAL, ANTERIOR APPROACH (Left: Hip)  Patient Location: PACU  Anesthesia Type:Spinal  Level of Consciousness: awake and alert   Airway & Oxygen Therapy: Patient Spontanous Breathing  Post-op Assessment: Post -op Vital signs reviewed and stable  Post vital signs: Reviewed and stable  Last Vitals:  Vitals Value Taken Time  BP 142/81 03/19/24 09:45  Temp    Pulse 59 03/19/24 09:48  Resp 15 03/19/24 09:48  SpO2 100 % 03/19/24 09:48  Vitals shown include unfiled device data.  Last Pain:  Vitals:   03/19/24 9147  TempSrc:   PainSc: 4          Complications: No notable events documented.

## 2024-03-19 NOTE — Anesthesia Procedure Notes (Signed)
 Date/Time: 03/19/2024 7:34 AM  Performed by: Elaina Graver, CRNAPre-anesthesia Checklist: Patient identified, Emergency Drugs available, Suction available, Patient being monitored and Timeout performed Patient Re-evaluated:Patient Re-evaluated prior to induction Oxygen Delivery Method: Simple face mask Preoxygenation: Pre-oxygenation with 100% oxygen Induction Type: IV induction Placement Confirmation: positive ETCO2 Dental Injury: Teeth and Oropharynx as per pre-operative assessment

## 2024-03-19 NOTE — Op Note (Signed)
 03/19/2024  9:09 AM  PATIENT:  Renee Cross   MRN: [id number]  PRE-OPERATIVE DIAGNOSIS:  OA LEFT HIP  POST-OPERATIVE DIAGNOSIS:  OA LEFT HIP  PROCEDURE:  Procedure(s): ARTHROPLASTY, HIP, TOTAL, ANTERIOR APPROACH  PREOPERATIVE INDICATIONS:    Renee Cross is an 48 y.o. female who has a diagnosis of <principal problem not specified> and elected for surgical management after failing conservative treatment.  The risks benefits and alternatives were discussed with the patient including but not limited to the risks of nonoperative treatment, versus surgical intervention including infection, bleeding, nerve injury, periprosthetic fracture, the need for revision surgery, dislocation, leg length discrepancy, blood clots, cardiopulmonary complications, morbidity, mortality, among others, and they were willing to proceed.     OPERATIVE REPORT     SURGEON:   Saundra Curl, MD    ASSISTANT:  Marzella Solan, PA-C, she was present and scrubbed throughout the case, critical for completion in a timely fashion, and for retraction, instrumentation, and closure.     ANESTHESIA:  General    COMPLICATIONS:  None.     COMPONENTS:  [employer] insignia femur size 4 with a 36 mm -5 head ball and an acetabular shell size 50 with a  polyethylene liner    PROCEDURE IN DETAIL:   The patient was met in the holding area and  identified.  The appropriate hip was identified and marked at the operative site.  The patient was then transported to the OR  and  placed under anesthesia per that record.  At that point, the patient was  placed in the supine position and  secured to the operating room table and all bony prominences padded. He received pre-operative antibiotics    The operative lower extremity was prepped from the iliac crest to the distal leg.  Sterile draping was performed.  Time out was performed prior to incision.      Skin incision was made just 2 cm lateral to the ASIS  extending in line with the  tensor fascia lata. Electrocautery was used to control all bleeders. I dissected down sharply to the fascia of the tensor fascia lata was confirmed that the muscle fibers beneath were running posteriorly. I then incised the fascia over the superficial tensor fascia lata in line with the incision. The fascia was elevated off the anterior aspect of the muscle the muscle was retracted posteriorly and protected throughout the case. I then used electrocautery to incise the tensor fascia lata fascia control and all bleeders. Immediately visible was the fat over top of the anterior neck and capsule.  I removed the anterior fat from the capsule and elevated the rectus muscle off of the anterior capsule. I then removed a large time of capsule. The retractors were then placed over the anterior acetabulum as well as around the superior and inferior neck.  I then made a femoral neck cut. Then used the power corkscrew to remove the femoral head from the acetabulum and thoroughly irrigated the acetabulum. I sized the femoral head.    I then exposed the deep acetabulum, cleared out any tissue including the ligamentum teres.   After adequate visualization, I excised the labrum, and then sequentially reamed.  I then impacted the acetabular implant into place using fluoroscopy for guidance.  Appropriate version and inclination was confirmed clinically matching their bony anatomy, and with fluoroscopy.  I placed a 20 mm screw in the posterior/superio position with an excellent bite.    I then placed the polyethylene liner in place  I then adducted the leg and released the external rotators from the posterior femur allowing it to be easily delivered up lateral and anterior to the acetabulum for preparation of the femoral canal.    I then prepared the proximal femur using the cookie-cutter and then sequentially reamed and broached.  A trial broach, neck, and head was utilized, and I reduced the hip and used floroscopy to  assess the neck length and femoral implant.  I then impacted the femoral prosthesis into place into the appropriate version. The hip was then reduced and fluoroscopy confirmed appropriate position. Leg lengths were restored.  I then irrigated the hip copiously again with, and repaired the fascia with Vicryl, followed by monocryl for the subcutaneous tissue, Monocryl for the skin, Steri-Strips and sterile gauze. The patient was then awakened and returned to PACU in stable and satisfactory condition. There were no complications.  POST OPERATIVE PLAN: WBAT, DVT px: SCD's/TED, ambulation and chemical dvt px  Randal Bury, MD Orthopedic Surgeon [phone]

## 2024-03-19 NOTE — Anesthesia Procedure Notes (Signed)
 Spinal  Patient location during procedure: OR Start time: 03/19/2024 7:38 AM End time: 03/19/2024 7:43 AM Reason for block: surgical anesthesia Staffing Performed: anesthesiologist  Anesthesiologist: Peggy Bowens, MD Performed by: Peggy Bowens, MD Authorized by: Peggy Bowens, MD   Preanesthetic Checklist Completed: patient identified, IV checked, site marked, risks and benefits discussed, surgical consent, monitors and equipment checked, pre-op evaluation and timeout performed Spinal Block Patient position: sitting Prep: DuraPrep Patient monitoring: heart rate, cardiac monitor, continuous pulse ox and blood pressure Approach: midline Location: L4-5 Injection technique: single-shot Needle Needle type: Pencan  Needle gauge: 24 G Needle length: 9 cm Assessment Sensory level: T4 Events: CSF return

## 2024-03-20 NOTE — Anesthesia Postprocedure Evaluation (Signed)
 Anesthesia Post Note  [name]  Procedure(s) Performed: ARTHROPLASTY, HIP, TOTAL, ANTERIOR APPROACH (Left: Hip)     Patient location during evaluation: PACU Anesthesia Type: Spinal Level of consciousness: awake and alert Pain management: pain level controlled Vital Signs Assessment: post-procedure vital signs reviewed and stable Respiratory status: spontaneous breathing and respiratory function stable Cardiovascular status: blood pressure returned to baseline and stable Postop Assessment: spinal receding Anesthetic complications: no   No notable events documented.  Last Vitals:  Vitals:   03/19/24 1300 03/19/24 1322  BP: (!) 160/91 (!) 160/90  Pulse:  72  Resp:    Temp:  36.5 C  SpO2:  98%    Last Pain:  Vitals:   03/19/24 1345  TempSrc:   PainSc: 5                  Melvenia Stabs

## 2024-03-21 ENCOUNTER — Encounter (HOSPITAL_COMMUNITY): Payer: Self-pay | Admitting: Orthopedic Surgery

## 2024-03-21 NOTE — Therapy (Signed)
 OUTPATIENT PHYSICAL THERAPY LOWER EXTREMITY EVALUATION   Patient Name: Renee Cross MRN: [id number] DOB:[birth year], 48 y.o., female Today's Date: 03/22/2024  END OF SESSION:  PT End of Session - 03/22/24 1057     Visit Number 1    Date for PT Re-Evaluation 06/21/24    Authorization Type [employer]    PT Start Time 1100    PT Stop Time 1145    PT Time Calculation (min) 45 min          Past Medical History:  Diagnosis Date   Abnormal uterine bleeding (AUB) 12/22/2022   Acute recurrent sinusitis 01/27/2021   Alcohol use disorder 01/14/2023   Amblyopia of left eye 12/22/2022   Anemia    Arthritis    Asthma 09/12/2023   B12 deficiency 01/14/2023   Bilateral knee pain 06/28/2023   Class 1 drug-induced obesity with serious comorbidity and body mass index (BMI) of 33.0 to 33.9 in adult 08/26/2023   Depression    Elevated rheumatoid factor 12/28/2021   Encounter for disability assessment 12/22/2022   Episode of recurrent major depressive disorder (HCC) 01/14/2023   Financial difficulty 01/14/2023   Gastroesophageal reflux disease 12/22/2022   Hiatal hernia with GERD    Hip arthritis 12/22/2022   History of suicide attempt 01/14/2023   Hypertension    Iron deficiency anemia due to chronic blood loss 03/31/2022   Left groin mass 09/17/2020   MDD (major depressive disorder), recurrent episode, moderate (HCC) 08/23/2023   Microcytic hypochromic anemia 12/28/2021   Multiple allergies 09/12/2023   Nasal polyp 01/27/2021   Polypectomy over 20 years ago.     Non-recurrent acute suppurative otitis media of left ear without spontaneous rupture of tympanic membrane 05/16/2023   Osteoarthritis of both hips 01/21/2022   Osteoarthritis of right knee 07/02/2023   Overuse of medication 12/22/2022   Perennial allergic rhinitis with seasonal variation 12/22/2022   Pneumonia    Primary hypertension 12/22/2022   Primary osteoarthritis of both knees 01/21/2022   PTSD (post-traumatic stress  disorder)    Rheumatoid arthritis (HCC) 12/22/2022   Screening for colon cancer 08/26/2023   Severe persistent asthma with exacerbation 12/22/2022   Sickle cell trait (HCC)    Tobacco use disorder 05/16/2023   Past Surgical History:  Procedure Laterality Date   HERNIA REPAIR     SINUS EXPLORATION     STOMACH SURGERY  10/03/2001   Possible fundiplication?   TONSILLECTOMY     TOTAL HIP ARTHROPLASTY Right 02/14/2023   Procedure: TOTAL HIP ARTHROPLASTY ANTERIOR APPROACH;  Surgeon: Saundra Curl, MD;  Location: [facility];  Service: Orthopedics;  Laterality: Right;   TOTAL HIP ARTHROPLASTY Left 03/19/2024   Procedure: ARTHROPLASTY, HIP, TOTAL, ANTERIOR APPROACH;  Surgeon: Saundra Curl, MD;  Location: [facility];  Service: Orthopedics;  Laterality: Left;   TUBAL LIGATION     Patient Active Problem List   Diagnosis Date Noted   S/P total left hip arthroplasty 03/19/2024   Asthma 09/12/2023   Multiple allergies 09/12/2023   Arthritis    Depression    Hiatal hernia with GERD    Hypertension    Pneumonia    PTSD (post-traumatic stress disorder)    Sickle cell trait (HCC)    Class 1 obesity due to excess calories with serious comorbidity and body mass index (BMI) of 33.0 to 33.9 in adult 08/26/2023   Screening for colon cancer 08/26/2023   MDD (major depressive disorder), recurrent episode, moderate (HCC) 08/23/2023   Osteoarthritis of right knee 07/02/2023  Bilateral knee pain 06/28/2023   Non-recurrent acute suppurative otitis media of left ear without spontaneous rupture of tympanic membrane 05/16/2023   Tobacco use disorder 05/16/2023   Alcohol use disorder 01/14/2023   Financial difficulty 01/14/2023   Episode of recurrent major depressive disorder (HCC) 01/14/2023   B12 deficiency 01/14/2023   History of suicide attempt 01/14/2023   Amblyopia of left eye 12/22/2022   Anemia 12/22/2022   Rheumatoid arthritis (HCC) 12/22/2022   Abnormal uterine bleeding (AUB) 12/22/2022    Severe persistent asthma with exacerbation 12/22/2022   Hip arthritis 12/22/2022   Gastroesophageal reflux disease 12/22/2022   Perennial allergic rhinitis with seasonal variation 12/22/2022   Encounter for disability assessment 12/22/2022   Overuse of medication 12/22/2022   Primary hypertension 12/22/2022   Iron deficiency anemia due to chronic blood loss 03/31/2022   Osteoarthritis of both hips 01/21/2022   Primary osteoarthritis of both knees 01/21/2022   Elevated rheumatoid factor 12/28/2021   Microcytic hypochromic anemia 12/28/2021   Acute recurrent sinusitis 01/27/2021   Nasal polyp 01/27/2021   Left groin mass 09/17/2020    PCP: [facility]  REFERRING PROVIDER: Randal Bury  REFERRING DIAG: Post OP L Total Hip replacement   THERAPY DIAG:  Status post total hip replacement, left  Pain in left hip  Stiffness of left hip, not elsewhere classified  Other abnormalities of gait and mobility  Rationale for Evaluation and Treatment: Rehabilitation  ONSET DATE: 03/19/24  SUBJECTIVE:   SUBJECTIVE STATEMENT: Doing the best of my ability, but it has been rough. There is a lot of pain, it is worse than when I got the R one done. I get cold sweats, I can't sleep, I can't eat. With the R one I didn't feel anything afterwards.   PERTINENT HISTORY: R total hip 02/14/23 L total hip 03/19/24   PAIN:  Are you having pain? Yes: NPRS scale: 10/10 Pain location: L hip and knee, sometimes the R side aches Pain description: constant ache  Aggravating factors: moving it Relieving factors: elevating   PRECAUTIONS: Anterior hip  RED FLAGS: None   WEIGHT BEARING RESTRICTIONS: WBAT  FALLS:  Has patient fallen in last 6 months? No  LIVING ENVIRONMENT: Lives with: lives with their family Lives in: House/apartment Stairs: No Has following equipment at home: Single point cane and Environmental consultant - 2 wheeled  OCCUPATION: not working been out for 2 years   PLOF: Independent and  Independent with basic ADLs  PATIENT GOALS: get me back to being independent   NEXT MD VISIT:   OBJECTIVE:  Note: Objective measures were completed at Evaluation unless otherwise noted.  DIAGNOSTIC FINDINGS: R knee OA and small tear in lateral meniscus   COGNITION: Overall cognitive status: Within functional limits for tasks assessed     SENSATION: WFL  POSTURE: flexed trunk  and weight shift right  PALPATION: TTP around L hip and incision  LOWER EXTREMITY ROM: unable to lift up L hip from seated position, knee ROM is normal  RLE all WFL    LOWER EXTREMITY MMT:  MMT Right eval Left eval  Hip flexion  2-  Hip extension    Hip abduction  3+  Hip adduction  3+  Hip internal rotation    Hip external rotation    Knee flexion  4-  Knee extension  3+  Ankle dorsiflexion    Ankle plantarflexion    Ankle inversion    Ankle eversion     (Blank rows = not tested)   FUNCTIONAL  TESTS:  5 times sit to stand: 35s  Timed up and go (TUG): 34s with RW  GAIT: Distance walked: in clinic distances Assistive device utilized: Environmental consultant - 2 wheeled Level of assistance: Modified independence Comments: increase trunk flexion over walker, decreased stride length, decreased hip flexion on L, decreased stance time and step length on L, increased genu valgum bilaterally                                                                                                                                 TREATMENT DATE:  03/22/24- EVAL    PATIENT EDUCATION:  Education details: POC, HEP, precautions Person educated: Patient Education method: Explanation and Demonstration Education comprehension: verbalized understanding  HOME EXERCISE PROGRAM: Access Code: [email or web address] URL: [email or web address] Date: 03/22/2024 Prepared by: Donavon Fudge  Exercises - Sit to Stand  - 1 x daily - 7 x weekly - 2 sets - 10 reps - Standing Hip Abduction with Counter Support  - 1 x daily - 7 x  weekly - 2 sets - 10 reps - Standing March with Counter Support  - 1 x daily - 7 x weekly - 2 sets - 10 reps - Seated Hip Adduction Isometrics with Ball  - 1 x daily - 7 x weekly - 2 sets - 10 reps - 5 hold  ASSESSMENT:  CLINICAL IMPRESSION: Patient is a 48 y.o. female who was seen today for physical therapy evaluation and treatment for L total hip replacement on 03/19/24. 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 She had her R hip replaced last year and did well with recovery, she reports this time around feels much harder and the pain is intense. She is ambulating with a rolling walker and has a cane at home. Patient has significant weakness with hip flexion. No protocol was found in chart. She will benefit from PT to address her L hip weakness and ROM deficits to be able to ambulate independently and decrease her pain. She does have a limit of 27 visits.   OBJECTIVE IMPAIRMENTS: Abnormal gait, decreased activity tolerance, difficulty walking, decreased ROM, decreased strength, improper body mechanics, and pain.   ACTIVITY LIMITATIONS: sitting, standing, squatting, sleeping, stairs, transfers, and locomotion level  PARTICIPATION LIMITATIONS: cleaning, laundry, driving, shopping, community activity, and yard work  PERSONAL FACTORS: Age, Past/current experiences, Time since onset of injury/illness/exacerbation, Transportation, and 1-2 comorbidities: OA, RA, HTN are also affecting patient's functional outcome.   REHAB POTENTIAL: Good  CLINICAL DECISION MAKING: Stable/uncomplicated  EVALUATION COMPLEXITY: Low   GOALS: Goals reviewed with patient? Yes  SHORT TERM GOALS: Target date: 05/10/24  Patient will be independent with initial HEP. Baseline:  Goal status: INITIAL  2.  Patient will demonstrate TUG <25 with LRAD Baseline: 34s w/RW Goal status: INITIAL    LONG TERM GOALS: Target date: 06/21/24  Patient will be independent with  advanced/ongoing HEP to improve outcomes  and carryover.  Baseline:  Goal status: INITIAL  2.  Patient will report at least 75% improvement in L hip pain to improve QOL. (<3/10) Baseline: 10/10 Goal status: INITIAL  3.  Patient will demonstrate improved functional LE strength as demonstrated by 5xSTS <15s. Baseline: 35s from elevated mat Goal status: INITIAL  4.  Patient will be able to ambulate 500' with normal gait pattern without increased pain to access community.  Baseline: using RW at eval Goal status: INITIAL  5.  Patient will demonstrate TUG without AD <14s Baseline: 34s w/RW Goal status: INITIAL   PLAN:  PT FREQUENCY: 2x/week  PT DURATION: 12 weeks  PLANNED INTERVENTIONS: 97110-Therapeutic exercises, 97530- Therapeutic activity, 97112- Neuromuscular re-education, 97535- Self Care, [id number]- Manual therapy, [phone]- Gait training, [phone]- Vasopneumatic device, Patient/Family education, Balance training, Stair training, Joint mobilization, Spinal mobilization, Cryotherapy, and Moist heat  PLAN FOR NEXT SESSION: gentle hip strengthening and stretching, SL stability to improve weight bearing on L, gait    Allison, PT 03/22/2024, 11:42 AM

## 2024-03-22 ENCOUNTER — Ambulatory Visit: Attending: Orthopedic Surgery

## 2024-03-22 DIAGNOSIS — M25652 Stiffness of left hip, not elsewhere classified: Secondary | ICD-10-CM | POA: Insufficient documentation

## 2024-03-22 DIAGNOSIS — Z96642 Presence of left artificial hip joint: Secondary | ICD-10-CM | POA: Insufficient documentation

## 2024-03-22 DIAGNOSIS — M25552 Pain in left hip: Secondary | ICD-10-CM | POA: Diagnosis present

## 2024-03-22 DIAGNOSIS — R2689 Other abnormalities of gait and mobility: Secondary | ICD-10-CM | POA: Insufficient documentation

## 2024-03-25 ENCOUNTER — Ambulatory Visit

## 2024-03-26 ENCOUNTER — Ambulatory Visit: Admitting: Allergy and Immunology

## 2024-03-28 ENCOUNTER — Encounter: Payer: Self-pay | Admitting: Physical Therapy

## 2024-03-28 ENCOUNTER — Ambulatory Visit: Admitting: Physical Therapy

## 2024-03-28 DIAGNOSIS — M25552 Pain in left hip: Secondary | ICD-10-CM

## 2024-03-28 DIAGNOSIS — M25652 Stiffness of left hip, not elsewhere classified: Secondary | ICD-10-CM

## 2024-03-28 DIAGNOSIS — Z96642 Presence of left artificial hip joint: Secondary | ICD-10-CM | POA: Diagnosis not present

## 2024-03-28 DIAGNOSIS — R2689 Other abnormalities of gait and mobility: Secondary | ICD-10-CM | POA: Diagnosis not present

## 2024-03-28 NOTE — Therapy (Signed)
 OUTPATIENT PHYSICAL THERAPY LOWER EXTREMITY EVALUATION   Patient Name: Renee Cross MRN: [id number] DOB:April 22, 1976, 48 y.o., female Today's Date: 03/28/2024  END OF SESSION:  PT End of Session - 03/28/24 1152     Visit Number 2    Date for PT Re-Evaluation 06/21/24    PT Start Time 1152    PT Stop Time 1230    PT Time Calculation (min) 38 min    Activity Tolerance No increased pain;Patient tolerated treatment well    Behavior During Therapy [facility] for tasks assessed/performed          Past Medical History:  Diagnosis Date   Abnormal uterine bleeding (AUB) 12/22/2022   Acute recurrent sinusitis 01/27/2021   Alcohol use disorder 01/14/2023   Amblyopia of left eye 12/22/2022   Anemia    Arthritis    Asthma 09/12/2023   B12 deficiency 01/14/2023   Bilateral knee pain 06/28/2023   Class 1 drug-induced obesity with serious comorbidity and body mass index (BMI) of 33.0 to 33.9 in adult 08/26/2023   Depression    Elevated rheumatoid factor 12/28/2021   Encounter for disability assessment 12/22/2022   Episode of recurrent major depressive disorder (HCC) 01/14/2023   Financial difficulty 01/14/2023   Gastroesophageal reflux disease 12/22/2022   Hiatal hernia with GERD    Hip arthritis 12/22/2022   History of suicide attempt 01/14/2023   Hypertension    Iron deficiency anemia due to chronic blood loss 03/31/2022   Left groin mass 09/17/2020   MDD (major depressive disorder), recurrent episode, moderate (HCC) 08/23/2023   Microcytic hypochromic anemia 12/28/2021   Multiple allergies 09/12/2023   Nasal polyp 01/27/2021   Polypectomy over 20 years ago.     Non-recurrent acute suppurative otitis media of left ear without spontaneous rupture of tympanic membrane 05/16/2023   Osteoarthritis of both hips 01/21/2022   Osteoarthritis of right knee 07/02/2023   Overuse of medication 12/22/2022   Perennial allergic rhinitis with seasonal variation 12/22/2022   Pneumonia    Primary  hypertension 12/22/2022   Primary osteoarthritis of both knees 01/21/2022   PTSD (post-traumatic stress disorder)    Rheumatoid arthritis (HCC) 12/22/2022   Screening for colon cancer 08/26/2023   Severe persistent asthma with exacerbation 12/22/2022   Sickle cell trait (HCC)    Tobacco use disorder 05/16/2023   Past Surgical History:  Procedure Laterality Date   HERNIA REPAIR     SINUS EXPLORATION     STOMACH SURGERY  10/03/2001   Possible fundiplication?   TONSILLECTOMY     TOTAL HIP ARTHROPLASTY Right 02/14/2023   Procedure: TOTAL HIP ARTHROPLASTY ANTERIOR APPROACH;  Surgeon: Beverley Evalene BIRCH, MD;  Location: [facility];  Service: Orthopedics;  Laterality: Right;   TOTAL HIP ARTHROPLASTY Left 03/19/2024   Procedure: ARTHROPLASTY, HIP, TOTAL, ANTERIOR APPROACH;  Surgeon: Beverley Evalene BIRCH, MD;  Location: [facility];  Service: Orthopedics;  Laterality: Left;   TUBAL LIGATION     Patient Active Problem List   Diagnosis Date Noted   S/P total left hip arthroplasty 03/19/2024   Asthma 09/12/2023   Multiple allergies 09/12/2023   Arthritis    Depression    Hiatal hernia with GERD    Hypertension    Pneumonia    PTSD (post-traumatic stress disorder)    Sickle cell trait (HCC)    Class 1 obesity due to excess calories with serious comorbidity and body mass index (BMI) of 33.0 to 33.9 in adult 08/26/2023   Screening for colon cancer 08/26/2023   MDD (  major depressive disorder), recurrent episode, moderate (HCC) 08/23/2023   Osteoarthritis of right knee 07/02/2023   Bilateral knee pain 06/28/2023   Non-recurrent acute suppurative otitis media of left ear without spontaneous rupture of tympanic membrane 05/16/2023   Tobacco use disorder 05/16/2023   Alcohol use disorder 01/14/2023   Financial difficulty 01/14/2023   Episode of recurrent major depressive disorder (HCC) 01/14/2023   B12 deficiency 01/14/2023   History of suicide attempt 01/14/2023   Amblyopia of left eye 12/22/2022    Anemia 12/22/2022   Rheumatoid arthritis (HCC) 12/22/2022   Abnormal uterine bleeding (AUB) 12/22/2022   Severe persistent asthma with exacerbation 12/22/2022   Hip arthritis 12/22/2022   Gastroesophageal reflux disease 12/22/2022   Perennial allergic rhinitis with seasonal variation 12/22/2022   Encounter for disability assessment 12/22/2022   Overuse of medication 12/22/2022   Primary hypertension 12/22/2022   Iron deficiency anemia due to chronic blood loss 03/31/2022   Osteoarthritis of both hips 01/21/2022   Primary osteoarthritis of both knees 01/21/2022   Elevated rheumatoid factor 12/28/2021   Microcytic hypochromic anemia 12/28/2021   Acute recurrent sinusitis 01/27/2021   Nasal polyp 01/27/2021   Left groin mass 09/17/2020    PCP: Beverley Hummer  REFERRING PROVIDER: Evalene Chancy  REFERRING DIAG: Post OP L Total Hip replacement   THERAPY DIAG:  Status post total hip replacement, left  Stiffness of left hip, not elsewhere classified  Pain in left hip  Rationale for Evaluation and Treatment: Rehabilitation  ONSET DATE: 03/19/24  SUBJECTIVE:   SUBJECTIVE STATEMENT:  Pain and discomfort, leg is hard to lift   Doing the best of my ability, but it has been rough. There is a lot of pain, it is worse than when I got the R one done. I get cold sweats, I can't sleep, I can't eat. With the R one I didn't feel anything afterwards.   PERTINENT HISTORY: R total hip 02/14/23 L total hip 03/19/24   PAIN:  Are you having pain? Yes: NPRS scale: 5/10 Pain location: L hip and knee, sometimes the R side aches Pain description: constant ache  Aggravating factors: moving it Relieving factors: elevating   PRECAUTIONS: Anterior hip  RED FLAGS: None   WEIGHT BEARING RESTRICTIONS: WBAT  FALLS:  Has patient fallen in last 6 months? No  LIVING ENVIRONMENT: Lives with: lives with their family Lives in: House/apartment Stairs: No Has following equipment at home: Single  point cane and Environmental consultant - 2 wheeled  OCCUPATION: not working been out for 2 years   PLOF: Independent and Independent with basic ADLs  PATIENT GOALS: get me back to being independent   NEXT MD VISIT:   OBJECTIVE:  Note: Objective measures were completed at Evaluation unless otherwise noted.  DIAGNOSTIC FINDINGS: R knee OA and small tear in lateral meniscus   COGNITION: Overall cognitive status: Within functional limits for tasks assessed     SENSATION: WFL  POSTURE: flexed trunk  and weight shift right  PALPATION: TTP around L hip and incision  LOWER EXTREMITY ROM: unable to lift up L hip from seated position, knee ROM is normal  RLE all WFL    LOWER EXTREMITY MMT:  MMT Right eval Left eval  Hip flexion  2-  Hip extension    Hip abduction  3+  Hip adduction  3+  Hip internal rotation    Hip external rotation    Knee flexion  4-  Knee extension  3+  Ankle dorsiflexion    Ankle plantarflexion  Ankle inversion    Ankle eversion     (Blank rows = not tested)   FUNCTIONAL TESTS:  5 times sit to stand: 35s  Timed up and go (TUG): 34s with RW  GAIT: Distance walked: in clinic distances Assistive device utilized: Environmental consultant - 2 wheeled Level of assistance: Modified independence Comments: increase trunk flexion over walker, decreased stride length, decreased hip flexion on L, decreased stance time and step length on L, increased genu valgum bilaterally                                                                                                                                 TREATMENT DATE:  03/28/24 L hip PROM with end range holds  L HS curls 2x10 green LAQ LLE 3lb 3x10 Sit to stand form elevated surface 2x10 Standing march in RW x10  X10 no AD Gait 58ft x3 no AD LLE weakness, then 51ft   03/22/24- EVAL    PATIENT EDUCATION:  Education details: POC, HEP, precautions Person educated: Patient Education method: Explanation and Demonstration Education  comprehension: verbalized understanding  HOME EXERCISE PROGRAM: Access Code: [email or web address] URL: [email or web address] Date: 03/22/2024 Prepared by: Almetta Fam  Exercises - Sit to Stand  - 1 x daily - 7 x weekly - 2 sets - 10 reps - Standing Hip Abduction with Counter Support  - 1 x daily - 7 x weekly - 2 sets - 10 reps - Standing March with Counter Support  - 1 x daily - 7 x weekly - 2 sets - 10 reps - Seated Hip Adduction Isometrics with Ball  - 1 x daily - 7 x weekly - 2 sets - 10 reps - 5 hold  ASSESSMENT:  CLINICAL IMPRESSION: Patient is a 48 y.o. female who was seen today for physical therapy and treatment for L total hip replacement on 03/19/24. 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  She is ambulating with a rolling walker and has a cane at home. Session consisted of RLE strengthening and weight bearing activities with out AD. L hip weakness all single leg weight bearing phases of non AD activities. Encouragement needed at times. She will benefit from PT to address her L hip weakness and ROM deficits to be able to ambulate independently and decrease her pain. She does have a limit of 27 visits.   OBJECTIVE IMPAIRMENTS: Abnormal gait, decreased activity tolerance, difficulty walking, decreased ROM, decreased strength, improper body mechanics, and pain.   ACTIVITY LIMITATIONS: sitting, standing, squatting, sleeping, stairs, transfers, and locomotion level  PARTICIPATION LIMITATIONS: cleaning, laundry, driving, shopping, community activity, and yard work  PERSONAL FACTORS: Age, Past/current experiences, Time since onset of injury/illness/exacerbation, Transportation, and 1-2 comorbidities: OA, RA, HTN are also affecting patient's functional outcome.   REHAB POTENTIAL: Good  CLINICAL DECISION MAKING: Stable/uncomplicated  EVALUATION COMPLEXITY: Low   GOALS: Goals reviewed with patient? Yes  SHORT TERM  GOALS: Target date:  05/10/24  Patient will be independent with initial HEP. Baseline:  Goal status: INITIAL  2.  Patient will demonstrate TUG <25 with LRAD Baseline: 34s w/RW Goal status: INITIAL    LONG TERM GOALS: Target date: 06/21/24  Patient will be independent with advanced/ongoing HEP to improve outcomes and carryover.  Baseline:  Goal status: INITIAL  2.  Patient will report at least 75% improvement in L hip pain to improve QOL. (<3/10) Baseline: 10/10 Goal status: INITIAL  3.  Patient will demonstrate improved functional LE strength as demonstrated by 5xSTS <15s. Baseline: 35s from elevated mat Goal status: INITIAL  4.  Patient will be able to ambulate 500' with normal gait pattern without increased pain to access community.  Baseline: using RW at eval Goal status: INITIAL  5.  Patient will demonstrate TUG without AD <14s Baseline: 34s w/RW Goal status: INITIAL   PLAN:  PT FREQUENCY: 2x/week  PT DURATION: 12 weeks  PLANNED INTERVENTIONS: 97110-Therapeutic exercises, 97530- Therapeutic activity, 97112- Neuromuscular re-education, 97535- Self Care, [id number]- Manual therapy, [phone]- Gait training, [phone]- Vasopneumatic device, Patient/Family education, Balance training, Stair training, Joint mobilization, Spinal mobilization, Cryotherapy, and Moist heat  PLAN FOR NEXT SESSION: gentle hip strengthening and stretching, SL stability to improve weight bearing on L, gait    Tanda KANDICE Sorrow, PTA 03/28/2024, 11:55 AM

## 2024-03-31 NOTE — Patient Instructions (Incomplete)
  1.  Allergen avoidance measures -dust, mold, pollens. Return to the clinic to update allergy  testing. Stop antihistamines for 3 days before the testing appointment  2.  Treat and prevent inflammation:  A. Dulera  200 HFA - 2 inhalation 2 time per day (empty lungs) B. Flonase  - 2 spray each nostril 1 time per day C. Begin Te[place]spire  injections for asthma control  3. Treat and prevent reflux:  A. Nexium  40 mg - 1 tablet in AM B. Famotidine  40 mg - 1 tablet in PM  4. If needed:   A. Albuterol  HFA - 2 inhalations every 4-6 hours B. Loratadine  10 mg - 1 tablet 1-2 times per day C. Shower followed by mometasone  0.1% ointment daily    D.  Cromolyn eyedrops  5. Return to clinic in 8 weeks or earlier if problem

## 2024-03-31 NOTE — Progress Notes (Unsigned)
   [address] [place] [address] Dept: [phone]  FOLLOW UP NOTE  Patient ID: Renee Cross, female    DOB: 02-15-1976  Age: 48 y.o. MRN: [id number] Date of Office Visit: 04/01/2024  Assessment  Chief Complaint: No chief complaint on file.  HPI Renee Cross she was last seen in this clinic on 09/05/2023 by Dr. Kozlow for evaluation of asthma, allergic rhinitis, atopic dermatitis, nasal polyposis, anosmia, and eustachian tube dysfunction.  Discussed the use of AI scribe software for clinical note transcription with the patient, who gave verbal consent to proceed.  History of Present Illness      Drug Allergies:  No Known Allergies  Physical Exam: There were no vitals taken for this visit.   Physical Exam  Diagnostics:    Assessment and Plan: No diagnosis found.  No orders of the defined types were placed in this encounter.   There are no Patient Instructions on file for this visit.  No follow-ups on file.    Thank you for the opportunity to care for this patient.  Please do not hesitate to contact me with questions.  Arlean Mutter, FNP [facility]

## 2024-04-01 ENCOUNTER — Ambulatory Visit (INDEPENDENT_AMBULATORY_CARE_PROVIDER_SITE_OTHER): Admitting: Family Medicine

## 2024-04-01 ENCOUNTER — Other Ambulatory Visit: Payer: Self-pay

## 2024-04-01 ENCOUNTER — Encounter: Payer: Self-pay | Admitting: Family Medicine

## 2024-04-01 ENCOUNTER — Encounter: Payer: Self-pay | Admitting: Physical Therapy

## 2024-04-01 ENCOUNTER — Ambulatory Visit: Admitting: Physical Therapy

## 2024-04-01 VITALS — BP 106/72 | HR 94 | Temp 98.2°F | Ht 66.0 in | Wt 215.0 lb

## 2024-04-01 DIAGNOSIS — J339 Nasal polyp, unspecified: Secondary | ICD-10-CM

## 2024-04-01 DIAGNOSIS — J4550 Severe persistent asthma, uncomplicated: Secondary | ICD-10-CM

## 2024-04-01 DIAGNOSIS — R2689 Other abnormalities of gait and mobility: Secondary | ICD-10-CM | POA: Diagnosis not present

## 2024-04-01 DIAGNOSIS — H6993 Unspecified Eustachian tube disorder, bilateral: Secondary | ICD-10-CM | POA: Insufficient documentation

## 2024-04-01 DIAGNOSIS — M25552 Pain in left hip: Secondary | ICD-10-CM

## 2024-04-01 DIAGNOSIS — M25652 Stiffness of left hip, not elsewhere classified: Secondary | ICD-10-CM | POA: Diagnosis not present

## 2024-04-01 DIAGNOSIS — L2084 Intrinsic (allergic) eczema: Secondary | ICD-10-CM | POA: Diagnosis not present

## 2024-04-01 DIAGNOSIS — L282 Other prurigo: Secondary | ICD-10-CM | POA: Diagnosis not present

## 2024-04-01 DIAGNOSIS — J302 Other seasonal allergic rhinitis: Secondary | ICD-10-CM

## 2024-04-01 DIAGNOSIS — Z96642 Presence of left artificial hip joint: Secondary | ICD-10-CM

## 2024-04-01 DIAGNOSIS — J3089 Other allergic rhinitis: Secondary | ICD-10-CM | POA: Diagnosis not present

## 2024-04-01 MED ORDER — LORATADINE 10 MG PO TABS: 10.0000 mg | ORAL_TABLET | Freq: Every day | ORAL | 0 refills | Status: DC

## 2024-04-01 MED ORDER — MONTELUKAST SODIUM 10 MG PO TABS: 10.0000 mg | ORAL_TABLET | Freq: Every day | ORAL | 5 refills | Status: DC

## 2024-04-01 MED ORDER — CROMOLYN SODIUM 4 % OP SOLN: 2.0000 [drp] | Freq: Four times a day (QID) | OPHTHALMIC | 5 refills | Status: DC | PRN

## 2024-04-01 MED ORDER — ESOMEPRAZOLE MAGNESIUM 40 MG PO CPDR: 40.0000 mg | DELAYED_RELEASE_CAPSULE | Freq: Every morning | ORAL | 5 refills | Status: DC

## 2024-04-01 MED ORDER — FAMOTIDINE 40 MG PO TABS: 40.0000 mg | ORAL_TABLET | Freq: Every day | ORAL | 5 refills | Status: DC

## 2024-04-01 MED ORDER — FLUTICASONE PROPIONATE 50 MCG/ACT NA SUSP: 2.0000 | Freq: Every morning | NASAL | 5 refills | Status: DC

## 2024-04-01 NOTE — Therapy (Signed)
 OUTPATIENT PHYSICAL THERAPY LOWER EXTREMITY EVALUATION   Patient Name: Renee Cross MRN: [id number] DOB:Jun 27, 1976, 48 y.o., female Today's Date: 04/01/2024  END OF SESSION:  PT End of Session - 04/01/24 1154     Visit Number 3    Date for PT Re-Evaluation 06/21/24    PT Start Time 1154    PT Stop Time 1230    PT Time Calculation (min) 36 min    Activity Tolerance No increased pain;Patient tolerated treatment well    Behavior During Therapy [facility] for tasks assessed/performed          Past Medical History:  Diagnosis Date   Abnormal uterine bleeding (AUB) 12/22/2022   Acute recurrent sinusitis 01/27/2021   Alcohol use disorder 01/14/2023   Amblyopia of left eye 12/22/2022   Anemia    Arthritis    Asthma 09/12/2023   B12 deficiency 01/14/2023   Bilateral knee pain 06/28/2023   Class 1 drug-induced obesity with serious comorbidity and body mass index (BMI) of 33.0 to 33.9 in adult 08/26/2023   Depression    Elevated rheumatoid factor 12/28/2021   Encounter for disability assessment 12/22/2022   Episode of recurrent major depressive disorder (HCC) 01/14/2023   Financial difficulty 01/14/2023   Gastroesophageal reflux disease 12/22/2022   Hiatal hernia with GERD    Hip arthritis 12/22/2022   History of suicide attempt 01/14/2023   Hypertension    Iron deficiency anemia due to chronic blood loss 03/31/2022   Left groin mass 09/17/2020   MDD (major depressive disorder), recurrent episode, moderate (HCC) 08/23/2023   Microcytic hypochromic anemia 12/28/2021   Multiple allergies 09/12/2023   Nasal polyp 01/27/2021   Polypectomy over 20 years ago.     Non-recurrent acute suppurative otitis media of left ear without spontaneous rupture of tympanic membrane 05/16/2023   Osteoarthritis of both hips 01/21/2022   Osteoarthritis of right knee 07/02/2023   Overuse of medication 12/22/2022   Perennial allergic rhinitis with seasonal variation 12/22/2022   Pneumonia    Primary  hypertension 12/22/2022   Primary osteoarthritis of both knees 01/21/2022   PTSD (post-traumatic stress disorder)    Rheumatoid arthritis (HCC) 12/22/2022   Screening for colon cancer 08/26/2023   Severe persistent asthma with exacerbation 12/22/2022   Sickle cell trait (HCC)    Tobacco use disorder 05/16/2023   Past Surgical History:  Procedure Laterality Date   HERNIA REPAIR     SINUS EXPLORATION     STOMACH SURGERY  10/03/2001   Possible fundiplication?   TONSILLECTOMY     TOTAL HIP ARTHROPLASTY Right 02/14/2023   Procedure: TOTAL HIP ARTHROPLASTY ANTERIOR APPROACH;  Surgeon: Beverley Evalene BIRCH, MD;  Location: [facility];  Service: Orthopedics;  Laterality: Right;   TOTAL HIP ARTHROPLASTY Left 03/19/2024   Procedure: ARTHROPLASTY, HIP, TOTAL, ANTERIOR APPROACH;  Surgeon: Beverley Evalene BIRCH, MD;  Location: [facility];  Service: Orthopedics;  Laterality: Left;   TUBAL LIGATION     Patient Active Problem List   Diagnosis Date Noted   S/P total left hip arthroplasty 03/19/2024   Asthma 09/12/2023   Multiple allergies 09/12/2023   Arthritis    Depression    Hiatal hernia with GERD    Hypertension    Pneumonia    PTSD (post-traumatic stress disorder)    Sickle cell trait (HCC)    Class 1 obesity due to excess calories with serious comorbidity and body mass index (BMI) of 33.0 to 33.9 in adult 08/26/2023   Screening for colon cancer 08/26/2023   MDD (  major depressive disorder), recurrent episode, moderate (HCC) 08/23/2023   Osteoarthritis of right knee 07/02/2023   Bilateral knee pain 06/28/2023   Non-recurrent acute suppurative otitis media of left ear without spontaneous rupture of tympanic membrane 05/16/2023   Tobacco use disorder 05/16/2023   Alcohol use disorder 01/14/2023   Financial difficulty 01/14/2023   Episode of recurrent major depressive disorder (HCC) 01/14/2023   B12 deficiency 01/14/2023   History of suicide attempt 01/14/2023   Amblyopia of left eye 12/22/2022    Anemia 12/22/2022   Rheumatoid arthritis (HCC) 12/22/2022   Abnormal uterine bleeding (AUB) 12/22/2022   Severe persistent asthma with exacerbation 12/22/2022   Hip arthritis 12/22/2022   Gastroesophageal reflux disease 12/22/2022   Perennial allergic rhinitis with seasonal variation 12/22/2022   Encounter for disability assessment 12/22/2022   Overuse of medication 12/22/2022   Primary hypertension 12/22/2022   Iron deficiency anemia due to chronic blood loss 03/31/2022   Osteoarthritis of both hips 01/21/2022   Primary osteoarthritis of both knees 01/21/2022   Elevated rheumatoid factor 12/28/2021   Microcytic hypochromic anemia 12/28/2021   Acute recurrent sinusitis 01/27/2021   Nasal polyp 01/27/2021   Left groin mass 09/17/2020    PCP: Beverley Hummer  REFERRING PROVIDER: Evalene Chancy  REFERRING DIAG: Post OP L Total Hip replacement   THERAPY DIAG:  Status post total hip replacement, left  Stiffness of left hip, not elsewhere classified  Pain in left hip  Other abnormalities of gait and mobility  Rationale for Evaluation and Treatment: Rehabilitation  ONSET DATE: 03/19/24  SUBJECTIVE:   SUBJECTIVE STATEMENT: Just tired his is ok, burning in the incision   Doing the best of my ability, but it has been rough. There is a lot of pain, it is worse than when I got the R one done. I get cold sweats, I can't sleep, I can't eat. With the R one I didn't feel anything afterwards.   PERTINENT HISTORY: R total hip 02/14/23 L total hip 03/19/24   PAIN:  Are you having pain? Yes: NPRS scale: 4/10 Pain location: L hip and knee, sometimes the R side aches Pain description: constant ache  Aggravating factors: moving it Relieving factors: elevating   PRECAUTIONS: Anterior hip  RED FLAGS: None   WEIGHT BEARING RESTRICTIONS: WBAT  FALLS:  Has patient fallen in last 6 months? No  LIVING ENVIRONMENT: Lives with: lives with their family Lives in:  House/apartment Stairs: No Has following equipment at home: Single point cane and Environmental consultant - 2 wheeled  OCCUPATION: not working been out for 2 years   PLOF: Independent and Independent with basic ADLs  PATIENT GOALS: get me back to being independent   NEXT MD VISIT:   OBJECTIVE:  Note: Objective measures were completed at Evaluation unless otherwise noted.  DIAGNOSTIC FINDINGS: R knee OA and small tear in lateral meniscus   COGNITION: Overall cognitive status: Within functional limits for tasks assessed     SENSATION: WFL  POSTURE: flexed trunk  and weight shift right  PALPATION: TTP around L hip and incision  LOWER EXTREMITY ROM: unable to lift up L hip from seated position, knee ROM is normal  RLE all WFL    LOWER EXTREMITY MMT:  MMT Right eval Left eval  Hip flexion  2-  Hip extension    Hip abduction  3+  Hip adduction  3+  Hip internal rotation    Hip external rotation    Knee flexion  4-  Knee extension  3+  Ankle  dorsiflexion    Ankle plantarflexion    Ankle inversion    Ankle eversion     (Blank rows = not tested)   FUNCTIONAL TESTS:  5 times sit to stand: 35s  Timed up and go (TUG): 34s with RW  GAIT: Distance walked: in clinic distances Assistive device utilized: Environmental consultant - 2 wheeled Level of assistance: Modified independence Comments: increase trunk flexion over walker, decreased stride length, decreased hip flexion on L, decreased stance time and step length on L, increased genu valgum bilaterally                                                                                                                                 TREATMENT DATE:  04/01/24 NuStep L 5 x 6 min HS curls 25lb 2x10, LLE 15 2x5 Leg Ext 10lb 2x10, RLE 5lb 2x5 Sit to stand form elevated surface 2x10 Standing in RW hip Ext & abd 2lb 2x10 each   03/28/24 L hip PROM with end range holds  L HS curls 2x10 green LAQ LLE 3lb 3x10 Sit to stand form elevated surface  2x10 Standing march in RW x10  X10 no AD Gait 18ft x3 no AD LLE weakness, then 26ft   03/22/24- EVAL    PATIENT EDUCATION:  Education details: POC, HEP, precautions Person educated: Patient Education method: Explanation and Demonstration Education comprehension: verbalized understanding  HOME EXERCISE PROGRAM: Access Code: [email or web address] URL: [email or web address] Date: 03/22/2024 Prepared by: Almetta Fam  Exercises - Sit to Stand  - 1 x daily - 7 x weekly - 2 sets - 10 reps - Standing Hip Abduction with Counter Support  - 1 x daily - 7 x weekly - 2 sets - 10 reps - Standing March with Counter Support  - 1 x daily - 7 x weekly - 2 sets - 10 reps - Seated Hip Adduction Isometrics with Ball  - 1 x daily - 7 x weekly - 2 sets - 10 reps - 5 hold  ASSESSMENT:  CLINICAL IMPRESSION: Patient is a 48 y.o. female who was seen today for physical therapy and treatment for L total hip replacement on 03/19/24. 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  She is ambulating with a rolling walker and has a cane at home. Reports trying to walk without AD 1 day but was unable to do.  Session consisted of RLE strengthening and weight bearing activities with out AD. L hip pain reported when unloading after doing hip ext and abd with RLE. Encouragement needed at times. She will benefit from PT to address her L hip weakness and ROM deficits to be able to ambulate independently and decrease her pain. She does have a limit of 27 visits.   OBJECTIVE IMPAIRMENTS: Abnormal gait, decreased activity tolerance, difficulty walking, decreased ROM, decreased strength, improper body mechanics, and pain.   ACTIVITY LIMITATIONS: sitting, standing, squatting, sleeping, stairs, transfers,  and locomotion level  PARTICIPATION LIMITATIONS: cleaning, laundry, driving, shopping, community activity, and yard work  PERSONAL FACTORS: Age, Past/current experiences, Time since  onset of injury/illness/exacerbation, Transportation, and 1-2 comorbidities: OA, RA, HTN are also affecting patient's functional outcome.   REHAB POTENTIAL: Good  CLINICAL DECISION MAKING: Stable/uncomplicated  EVALUATION COMPLEXITY: Low   GOALS: Goals reviewed with patient? Yes  SHORT TERM GOALS: Target date: 05/10/24  Patient will be independent with initial HEP. Baseline:  Goal status: INITIAL  2.  Patient will demonstrate TUG <25 with LRAD Baseline: 34s w/RW Goal status: INITIAL    LONG TERM GOALS: Target date: 06/21/24  Patient will be independent with advanced/ongoing HEP to improve outcomes and carryover.  Baseline:  Goal status: INITIAL  2.  Patient will report at least 75% improvement in L hip pain to improve QOL. (<3/10) Baseline: 10/10 Goal status: INITIAL  3.  Patient will demonstrate improved functional LE strength as demonstrated by 5xSTS <15s. Baseline: 35s from elevated mat Goal status: INITIAL  4.  Patient will be able to ambulate 500' with normal gait pattern without increased pain to access community.  Baseline: using RW at eval Goal status: INITIAL  5.  Patient will demonstrate TUG without AD <14s Baseline: 34s w/RW Goal status: INITIAL   PLAN:  PT FREQUENCY: 2x/week  PT DURATION: 12 weeks  PLANNED INTERVENTIONS: 97110-Therapeutic exercises, 97530- Therapeutic activity, 97112- Neuromuscular re-education, 97535- Self Care, [id number]- Manual therapy, [phone]- Gait training, [phone]- Vasopneumatic device, Patient/Family education, Balance training, Stair training, Joint mobilization, Spinal mobilization, Cryotherapy, and Moist heat  PLAN FOR NEXT SESSION: gentle hip strengthening and stretching, SL stability to improve weight bearing on L, gait    Tanda KANDICE Sorrow, PTA 04/01/2024, 11:55 AM

## 2024-04-02 ENCOUNTER — Telehealth: Payer: Self-pay | Admitting: *Deleted

## 2024-04-02 ENCOUNTER — Other Ambulatory Visit: Payer: Self-pay

## 2024-04-02 NOTE — Telephone Encounter (Signed)
-----   Message from Arlean Mutter sent at 04/01/2024  5:13 PM EDT ----- Hi there Carrah Eppolito, Dr. Kozlow wanted this patient to start a biologic for her asthma (specifically mentioned Tezspire ). Looks like there was an issue with her insurance at that time. Has this issue been resolved? Would this apply to all the biologics? Thinking Dupixent would also be a good choice (if we can't get Tezi) due to asthma and nasal polyps. Thank you

## 2024-04-02 NOTE — Telephone Encounter (Signed)
 L/m for patient to contact me I did have Renee Cross run her Tezspire  from earlier this year and it still advises another primary cover per [employer]. I did try her [employer] coverage and it is still saying active. If she no longer has this coverage she will need to reach out them to fix same before processing Rx

## 2024-04-03 ENCOUNTER — Telehealth (HOSPITAL_BASED_OUTPATIENT_CLINIC_OR_DEPARTMENT_OTHER): Payer: Self-pay | Admitting: Psychiatry

## 2024-04-03 ENCOUNTER — Encounter (HOSPITAL_COMMUNITY): Payer: Self-pay

## 2024-04-03 DIAGNOSIS — Z91199 Patient's noncompliance with other medical treatment and regimen due to unspecified reason: Secondary | ICD-10-CM

## 2024-04-03 NOTE — Therapy (Incomplete)
 OUTPATIENT PHYSICAL THERAPY LOWER EXTREMITY EVALUATION   Patient Name: Renee Cross MRN: [id number] DOB:December 13, 1975, 48 y.o., female Today's Date: 04/03/2024  END OF SESSION:    Past Medical History:  Diagnosis Date   Abnormal uterine bleeding (AUB) 12/22/2022   Acute recurrent sinusitis 01/27/2021   Alcohol use disorder 01/14/2023   Amblyopia of left eye 12/22/2022   Anemia    Arthritis    Asthma 09/12/2023   B12 deficiency 01/14/2023   Bilateral knee pain 06/28/2023   Class 1 drug-induced obesity with serious comorbidity and body mass index (BMI) of 33.0 to 33.9 in adult 08/26/2023   Depression    Elevated rheumatoid factor 12/28/2021   Encounter for disability assessment 12/22/2022   Episode of recurrent major depressive disorder (HCC) 01/14/2023   Financial difficulty 01/14/2023   Gastroesophageal reflux disease 12/22/2022   Hiatal hernia with GERD    Hip arthritis 12/22/2022   History of suicide attempt 01/14/2023   Hypertension    Iron deficiency anemia due to chronic blood loss 03/31/2022   Left groin mass 09/17/2020   MDD (major depressive disorder), recurrent episode, moderate (HCC) 08/23/2023   Microcytic hypochromic anemia 12/28/2021   Multiple allergies 09/12/2023   Nasal polyp 01/27/2021   Polypectomy over 20 years ago.     Non-recurrent acute suppurative otitis media of left ear without spontaneous rupture of tympanic membrane 05/16/2023   Osteoarthritis of both hips 01/21/2022   Osteoarthritis of right knee 07/02/2023   Overuse of medication 12/22/2022   Perennial allergic rhinitis with seasonal variation 12/22/2022   Pneumonia    Primary hypertension 12/22/2022   Primary osteoarthritis of both knees 01/21/2022   PTSD (post-traumatic stress disorder)    Rheumatoid arthritis (HCC) 12/22/2022   Screening for colon cancer 08/26/2023   Severe persistent asthma with exacerbation 12/22/2022   Sickle cell trait (HCC)    Tobacco use disorder 05/16/2023    Past Surgical History:  Procedure Laterality Date   HERNIA REPAIR     SINUS EXPLORATION     STOMACH SURGERY  10/03/2001   Possible fundiplication?   TONSILLECTOMY     TOTAL HIP ARTHROPLASTY Right 02/14/2023   Procedure: TOTAL HIP ARTHROPLASTY ANTERIOR APPROACH;  Surgeon: Beverley Evalene BIRCH, MD;  Location: [facility];  Service: Orthopedics;  Laterality: Right;   TOTAL HIP ARTHROPLASTY Left 03/19/2024   Procedure: ARTHROPLASTY, HIP, TOTAL, ANTERIOR APPROACH;  Surgeon: Beverley Evalene BIRCH, MD;  Location: [facility];  Service: Orthopedics;  Laterality: Left;   TUBAL LIGATION     Patient Active Problem List   Diagnosis Date Noted   Intrinsic atopic dermatitis 04/01/2024   Papular urticaria 04/01/2024   Dysfunction of both eustachian tubes 04/01/2024   S/P total left hip arthroplasty 03/19/2024   Asthma 09/12/2023   Multiple allergies 09/12/2023   Arthritis    Depression    Hiatal hernia with GERD    Hypertension    Pneumonia    PTSD (post-traumatic stress disorder)    Sickle cell trait (HCC)    Class 1 obesity due to excess calories with serious comorbidity and body mass index (BMI) of 33.0 to 33.9 in adult 08/26/2023   Screening for colon cancer 08/26/2023   MDD (major depressive disorder), recurrent episode, moderate (HCC) 08/23/2023   Osteoarthritis of right knee 07/02/2023   Bilateral knee pain 06/28/2023   Non-recurrent acute suppurative otitis media of left ear without spontaneous rupture of tympanic membrane 05/16/2023   Tobacco use disorder 05/16/2023   Alcohol use disorder 01/14/2023   Financial difficulty  01/14/2023   Episode of recurrent major depressive disorder (HCC) 01/14/2023   B12 deficiency 01/14/2023   History of suicide attempt 01/14/2023   Amblyopia of left eye 12/22/2022   Anemia 12/22/2022   Rheumatoid arthritis (HCC) 12/22/2022   Abnormal uterine bleeding (AUB) 12/22/2022   Severe persistent asthma with exacerbation 12/22/2022   Hip arthritis 12/22/2022    Gastroesophageal reflux disease 12/22/2022   Seasonal and perennial allergic rhinitis 12/22/2022   Encounter for disability assessment 12/22/2022   Overuse of medication 12/22/2022   Primary hypertension 12/22/2022   Iron deficiency anemia due to chronic blood loss 03/31/2022   Osteoarthritis of both hips 01/21/2022   Primary osteoarthritis of both knees 01/21/2022   Elevated rheumatoid factor 12/28/2021   Microcytic hypochromic anemia 12/28/2021   Acute recurrent sinusitis 01/27/2021   Nasal polyposis 01/27/2021   Left groin mass 09/17/2020    PCP: Beverley Hummer  REFERRING PROVIDER: Evalene Chancy  REFERRING DIAG: Post OP L Total Hip replacement   THERAPY DIAG:  No diagnosis found.  Rationale for Evaluation and Treatment: Rehabilitation  ONSET DATE: 03/19/24  SUBJECTIVE:   SUBJECTIVE STATEMENT: Just tired his is ok, burning in the incision   Doing the best of my ability, but it has been rough. There is a lot of pain, it is worse than when I got the R one done. I get cold sweats, I can't sleep, I can't eat. With the R one I didn't feel anything afterwards.   PERTINENT HISTORY: R total hip 02/14/23 L total hip 03/19/24   PAIN:  Are you having pain? Yes: NPRS scale: 4/10 Pain location: L hip and knee, sometimes the R side aches Pain description: constant ache  Aggravating factors: moving it Relieving factors: elevating   PRECAUTIONS: Anterior hip  RED FLAGS: None   WEIGHT BEARING RESTRICTIONS: WBAT  FALLS:  Has patient fallen in last 6 months? No  LIVING ENVIRONMENT: Lives with: lives with their family Lives in: House/apartment Stairs: No Has following equipment at home: Single point cane and Environmental consultant - 2 wheeled  OCCUPATION: not working been out for 2 years   PLOF: Independent and Independent with basic ADLs  PATIENT GOALS: get me back to being independent   NEXT MD VISIT:   OBJECTIVE:  Note: Objective measures were completed at Evaluation unless  otherwise noted.  DIAGNOSTIC FINDINGS: R knee OA and small tear in lateral meniscus   COGNITION: Overall cognitive status: Within functional limits for tasks assessed     SENSATION: WFL  POSTURE: flexed trunk  and weight shift right  PALPATION: TTP around L hip and incision  LOWER EXTREMITY ROM: unable to lift up L hip from seated position, knee ROM is normal  RLE all WFL    LOWER EXTREMITY MMT:  MMT Right eval Left eval  Hip flexion  2-  Hip extension    Hip abduction  3+  Hip adduction  3+  Hip internal rotation    Hip external rotation    Knee flexion  4-  Knee extension  3+  Ankle dorsiflexion    Ankle plantarflexion    Ankle inversion    Ankle eversion     (Blank rows = not tested)   FUNCTIONAL TESTS:  5 times sit to stand: 35s  Timed up and go (TUG): 34s with RW  GAIT: Distance walked: in clinic distances Assistive device utilized: Environmental consultant - 2 wheeled Level of assistance: Modified independence Comments: increase trunk flexion over walker, decreased stride length, decreased hip flexion on L,  decreased stance time and step length on L, increased genu valgum bilaterally                                                                                                                                 TREATMENT DATE:  04/04/24 NuStep Step ups 4 STS Marching, hip abd and ext 2#  Walking without AD  04/01/24 NuStep L 5 x 6 min HS curls 25lb 2x10, LLE 15 2x5 Leg Ext 10lb 2x10, RLE 5lb 2x5 Sit to stand form elevated surface 2x10 Standing in RW hip Ext & abd 2lb 2x10 each   03/28/24 L hip PROM with end range holds  L HS curls 2x10 green LAQ LLE 3lb 3x10 Sit to stand form elevated surface 2x10 Standing march in RW x10  X10 no AD Gait 47ft x3 no AD LLE weakness, then 88ft   03/22/24- EVAL    PATIENT EDUCATION:  Education details: POC, HEP, precautions Person educated: Patient Education method: Explanation and Demonstration Education comprehension:  verbalized understanding  HOME EXERCISE PROGRAM: Access Code: [email or web address] URL: [email or web address] Date: 03/22/2024 Prepared by: Almetta Fam  Exercises - Sit to Stand  - 1 x daily - 7 x weekly - 2 sets - 10 reps - Standing Hip Abduction with Counter Support  - 1 x daily - 7 x weekly - 2 sets - 10 reps - Standing March with Counter Support  - 1 x daily - 7 x weekly - 2 sets - 10 reps - Seated Hip Adduction Isometrics with Ball  - 1 x daily - 7 x weekly - 2 sets - 10 reps - 5 hold  ASSESSMENT:  CLINICAL IMPRESSION: Patient is a 48 y.o. female who was seen today for physical therapy and treatment for L total hip replacement on 03/19/24. 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  She is ambulating with a rolling walker and has a cane at home. Reports trying to walk without AD 1 day but was unable to do.  Session consisted of RLE strengthening and weight bearing activities with out AD. L hip pain reported when unloading after doing hip ext and abd with RLE. Encouragement needed at times. She will benefit from PT to address her L hip weakness and ROM deficits to be able to ambulate independently and decrease her pain. She does have a limit of 27 visits.   OBJECTIVE IMPAIRMENTS: Abnormal gait, decreased activity tolerance, difficulty walking, decreased ROM, decreased strength, improper body mechanics, and pain.   ACTIVITY LIMITATIONS: sitting, standing, squatting, sleeping, stairs, transfers, and locomotion level  PARTICIPATION LIMITATIONS: cleaning, laundry, driving, shopping, community activity, and yard work  PERSONAL FACTORS: Age, Past/current experiences, Time since onset of injury/illness/exacerbation, Transportation, and 1-2 comorbidities: OA, RA, HTN are also affecting patient's functional outcome.   REHAB POTENTIAL: Good  CLINICAL DECISION MAKING: Stable/uncomplicated  EVALUATION COMPLEXITY: Low   GOALS: Goals reviewed with  patient?  Yes  SHORT TERM GOALS: Target date: 05/10/24  Patient will be independent with initial HEP. Baseline:  Goal status: INITIAL  2.  Patient will demonstrate TUG <25 with LRAD Baseline: 34s w/RW Goal status: INITIAL    LONG TERM GOALS: Target date: 06/21/24  Patient will be independent with advanced/ongoing HEP to improve outcomes and carryover.  Baseline:  Goal status: INITIAL  2.  Patient will report at least 75% improvement in L hip pain to improve QOL. (<3/10) Baseline: 10/10 Goal status: INITIAL  3.  Patient will demonstrate improved functional LE strength as demonstrated by 5xSTS <15s. Baseline: 35s from elevated mat Goal status: INITIAL  4.  Patient will be able to ambulate 500' with normal gait pattern without increased pain to access community.  Baseline: using RW at eval Goal status: INITIAL  5.  Patient will demonstrate TUG without AD <14s Baseline: 34s w/RW Goal status: INITIAL   PLAN:  PT FREQUENCY: 2x/week  PT DURATION: 12 weeks  PLANNED INTERVENTIONS: 97110-Therapeutic exercises, 97530- Therapeutic activity, 97112- Neuromuscular re-education, 97535- Self Care, [id number]- Manual therapy, [phone]- Gait training, [phone]- Vasopneumatic device, Patient/Family education, Balance training, Stair training, Joint mobilization, Spinal mobilization, Cryotherapy, and Moist heat  PLAN FOR NEXT SESSION: gentle hip strengthening and stretching, SL stability to improve weight bearing on L, gait    [place], PT 04/03/2024, 2:53 PM

## 2024-04-03 NOTE — Progress Notes (Addendum)
 Patient is no-show today.  Called and left a message to reschedule appointment.

## 2024-04-04 ENCOUNTER — Encounter: Payer: Self-pay | Admitting: Family Medicine

## 2024-04-04 ENCOUNTER — Ambulatory Visit (INDEPENDENT_AMBULATORY_CARE_PROVIDER_SITE_OTHER): Admitting: Family Medicine

## 2024-04-04 ENCOUNTER — Other Ambulatory Visit: Payer: Self-pay | Admitting: Allergy and Immunology

## 2024-04-04 ENCOUNTER — Ambulatory Visit: Attending: Orthopedic Surgery

## 2024-04-04 VITALS — BP 118/70 | HR 89 | Temp 98.1°F | Ht 66.0 in | Wt 213.6 lb

## 2024-04-04 DIAGNOSIS — E661 Drug-induced obesity: Secondary | ICD-10-CM

## 2024-04-04 DIAGNOSIS — K219 Gastro-esophageal reflux disease without esophagitis: Secondary | ICD-10-CM

## 2024-04-04 DIAGNOSIS — J4550 Severe persistent asthma, uncomplicated: Secondary | ICD-10-CM | POA: Diagnosis not present

## 2024-04-04 DIAGNOSIS — Z96642 Presence of left artificial hip joint: Secondary | ICD-10-CM | POA: Diagnosis not present

## 2024-04-04 DIAGNOSIS — M160 Bilateral primary osteoarthritis of hip: Secondary | ICD-10-CM | POA: Diagnosis not present

## 2024-04-04 DIAGNOSIS — I10 Essential (primary) hypertension: Secondary | ICD-10-CM | POA: Diagnosis not present

## 2024-04-04 DIAGNOSIS — E66811 Obesity, class 1: Secondary | ICD-10-CM | POA: Diagnosis not present

## 2024-04-04 DIAGNOSIS — Z6834 Body mass index (BMI) 34.0-34.9, adult: Secondary | ICD-10-CM

## 2024-04-04 DIAGNOSIS — K449 Diaphragmatic hernia without obstruction or gangrene: Secondary | ICD-10-CM | POA: Diagnosis not present

## 2024-04-04 DIAGNOSIS — K5903 Drug induced constipation: Secondary | ICD-10-CM | POA: Diagnosis not present

## 2024-04-04 DIAGNOSIS — Z22322 Carrier or suspected carrier of Methicillin resistant Staphylococcus aureus: Secondary | ICD-10-CM | POA: Diagnosis not present

## 2024-04-04 DIAGNOSIS — M25552 Pain in left hip: Secondary | ICD-10-CM | POA: Insufficient documentation

## 2024-04-04 DIAGNOSIS — M25652 Stiffness of left hip, not elsewhere classified: Secondary | ICD-10-CM | POA: Insufficient documentation

## 2024-04-04 MED ORDER — LINACLOTIDE 72 MCG PO CAPS: 72.0000 ug | ORAL_CAPSULE | Freq: Every day | ORAL | 0 refills | Status: AC

## 2024-04-04 MED ORDER — CELECOXIB 200 MG PO CAPS: 200.0000 mg | ORAL_CAPSULE | Freq: Two times a day (BID) | ORAL | 0 refills | Status: DC | PRN

## 2024-04-04 MED ORDER — WEGOVY 1 MG/0.5ML ~~LOC~~ SOAJ: 1.0000 mg | SUBCUTANEOUS | 0 refills | Status: AC

## 2024-04-04 NOTE — Progress Notes (Signed)
 Assessment & Plan   Assessment/Plan:    Assessment & Plan Severe Persistent Asthma Severe persistent asthma with concern about Airsupra  inhaler recall. Currently using Ventolin  HFA as a rescue inhaler and Dulera  as a controller inhaler along with montelukast . Previously on Tezspire , with potential change in biologic control by asthma and allergy  specialist. Smoking cessation over a year ago has positively impacted asthma management. - Continue Dulera  200-5 mcg, two puffs BID - Continue montelukast  10 mg daily - Use Ventolin  HFA as a rescue inhaler - Follow up with asthma and allergy  specialist regarding biologic control - Encourage continued smoking cessation  Osteoarthritis of the Hip Status Post Bilateral Hip Replacement Two weeks post bilateral hip replacement surgery. Reports left leg discomfort possibly due to excessive weight lifting during physical therapy. Experiencing pain and discomfort, particularly on the left side, and is currently taking oxycodone  for pain management. Previously on meloxicam  but experiencing GI side effects, switching to celecoxib which is less likely to cause GI upset and provides more consistent pain relief due to twice-daily dosing. - Discontinue meloxicam  15 daily - Prescribe celecoxib 200 mg twice daily as needed - Continue physical therapy - Monitor pain and GI symptoms - Ensure celecoxib is taken with food to minimize GI effects - Follow-up with orthopedics for ongoing postop recovery and pain management  Hiatal hernia with GERD Currently takes esomeprazole  40 mg in the a.m. and famotidine  40 mg in the p.m.  Ongoing GI upset while on meloxicam  for pain management.  Possibly NSAID gastritis.  Plan to transition to selective Cox 2 inhibitor to reduce risk of gastritis. 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  Previously on 0.5 mg Wegovy  injection weekly, stopped two weeks before recent hip surgery. Weight increased by about six pounds since April, likely due to multiple asthma exacerbations and recent surgery. Considering resuming Wegovy  at a higher dose of 1 mg weekly, which is where patients typically start seeing significant changes. No GI side effects reported from Wegovy , but monitoring is advised due to potential opioid-induced constipation. - Prescribe Wegovy  1 mg weekly - Encourage a healthy diet with fruits, vegetables, and low carbohydrates - Monitor weight and appetite changes - Watch for GI side effects  Constipation Constipation likely exacerbated by opioid use post-surgery. Using Senokot, Miralax, and Dulcolax with limited relief. Not diagnosed with IBS but experiences idiopathic constipation. Linzess is considered to help with constipation, offering a different mechanism of action. - Prescribe Linzess 72 mcg daily - Continue other laxatives as needed  Hypertension Hypertension is well controlled with current medication regimen. Blood pressure today is 118/70 mmHg. - Continue olmesartan , amlodipine , hydrochlorothiazide 40/10/25 mg daily  MRSA Colonization MRSA colonization detected during surgery, using mupirocin  ointment for nasal decolonization. Treatment reduces risk of MRSA infections, especially post-joint replacement surgery, as infections could necessitate hardware removal. - Continue mupirocin  ointment for three months      Medications Discontinued During This Encounter  Medication Reason   meloxicam  (MOBIC ) 15 MG tablet    Albuterol -Budesonide (AIRSUPRA ) 90-80 MCG/ACT AERO    WEGOVY  0.5 MG/0.5ML SOAJ Discontinued by provider    Return in about 1 month (around 05/05/2024) for 0.        Subjective:    Encounter date: 04/04/2024  Renee Cross is a 48 y.o. female who has Amblyopia of left eye; Anemia; Rheumatoid arthritis (HCC); Abnormal uterine bleeding (AUB); Severe persistent asthma with exacerbation; Hip arthritis; Gastroesophageal reflux disease; Seasonal and perennial allergic rhinitis; Encounter for disability assessment; Overuse of medication; Primary hypertension; Alcohol use disorder; Financial difficulty; Episode of recurrent major depressive disorder (HCC); B12 deficiency; History of suicide attempt; Non-recurrent acute suppurative otitis media of left ear without spontaneous rupture of tympanic membrane; Tobacco use disorder; Bilateral knee pain; Osteoarthritis of right knee; MDD (major depressive disorder), recurrent episode, moderate (HCC); Class 1 drug-induced obesity with serious comorbidity and body mass index (BMI) of 34.0 to 34.9 in adult; Screening for colon cancer; Arthritis; Depression; Hiatal hernia with GERD; Hypertension; Pneumonia; PTSD (post-traumatic stress disorder); Sickle cell trait (HCC); Acute recurrent sinusitis; Asthma; Elevated rheumatoid factor; Iron deficiency anemia due to chronic blood loss; Left groin mass; Microcytic hypochromic anemia; Multiple allergies; Nasal polyposis; Osteoarthritis of both hips; Primary osteoarthritis of both knees; S/P total left hip arthroplasty; Intrinsic atopic dermatitis; Papular urticaria; Dysfunction of both eustachian tubes; Drug induced constipation; and MRSA (methicillin resistant Staphylococcus aureus) colonization on their problem list..   She  has a past medical history of Abnormal uterine bleeding (AUB) (12/22/2022), Acute recurrent sinusitis (01/27/2021), Alcohol use disorder (01/14/2023), Amblyopia of left eye (12/22/2022), Anemia, Arthritis, Asthma (09/12/2023), B12 deficiency (01/14/2023), Bilateral knee pain (06/28/2023), Class 1 drug-induced obesity with serious comorbidity and body mass index (BMI) of 33.0 to 33.9 in  adult (08/26/2023), Depression, Elevated rheumatoid factor (12/28/2021), Encounter for disability assessment (12/22/2022), Episode of recurrent major depressive disorder (HCC) (01/14/2023), Financial difficulty (01/14/2023), Gastroesophageal reflux disease (12/22/2022), Hiatal hernia with GERD, Hip arthritis (12/22/2022), History of suicide attempt (01/14/2023), Hypertension, Iron deficiency anemia due to chronic blood loss (03/31/2022), Left groin mass (09/17/2020), MDD (major depressive disorder), recurrent episode, moderate (HCC) (08/23/2023), Microcytic hypochromic anemia (12/28/2021), Multiple allergies (09/12/2023), Nasal polyp (01/27/2021), Non-recurrent acute suppurative otitis media of left ear without spontaneous rupture of tympanic membrane (05/16/2023), Osteoarthritis of both hips (01/21/2022), Osteoarthritis of right knee (07/02/2023), Overuse of medication (12/22/2022), Perennial allergic rhinitis with seasonal variation (12/22/2022), Pneumonia, Primary hypertension (12/22/2022), Primary osteoarthritis of both knees (01/21/2022), PTSD (post-traumatic stress disorder), Rheumatoid arthritis (HCC) (12/22/2022), Screening for colon cancer (08/26/2023), Severe persistent asthma with exacerbation (12/22/2022), Sickle cell trait (HCC), and Tobacco use disorder (05/16/2023)..   She presents with chief complaint of Medical Management of Chronic Issues (38mo followup on labs and weight management; been through some ups and downs; had lip replacement two weeks ago), Medication Problem (Inhaler prescribed on recall (Airsupra )), and Facial Swelling (Was told something was in nose at hospital and was given some kind of ointment and just want nose rechecked ) .   Discussed the use of AI scribe software for clinical note transcription with the patient, who gave verbal consent to proceed.  History of Present Illness Renee Cross is a 48 year old female with severe persistent asthma and obesity who presents  for follow-up on weight management.  She was previously on 0.5 mg Wegovy  injection weekly but discontinued it two weeks prior to her recent bilateral hip replacement surgery. She has not resumed the medication and has experienced a weight increase of approximately six pounds since April. Her appetite has been affected by the surgery and limited mobility.  She has a history of severe persistent asthma and is concerned about a recall on her Airsupra  inhaler, which contains albuterol  and budesonide. She uses Ventolin  HFA as an alternative rescue inhaler and Dulera  (  200-5 mcg, two puffs twice daily) along with montelukast  (10 mg daily) as controller medications. She was previously on Tezspire  but is awaiting a change in her biologic control. She reports multiple recent asthma exacerbations.  She underwent bilateral hip replacement surgery two weeks ago, with the right hip replaced last year and the left recently. The rehabilitation process has been challenging, particularly with weight lifting exercises. She experiences pain in the left leg, which differs from the right side, and uses oxycodone  for pain management. She also takes meloxicam  but finds it ineffective for muscle spasms. Discomfort and stiffness are noted, especially when lying on her left side, with some relief when sleeping on her back with knees bent.  She has a history of hypertension, currently well-controlled with olmesartan , amlodipine , and hydrochlorothiazide (40/10/25 mg daily).  She reports gastrointestinal issues, including constipation, attributed to recent surgery and pain medication use. She uses Senokot, Miralax, and Dulcolax but still experiences infrequent bowel movements, approximately once every four days. No irritable bowel syndrome is noted, but ongoing constipation issues persist.  She has a history of MRSA colonization detected during surgery and is using mupirocin  ointment for nasal application as part of a three-month  treatment plan to reduce the risk of infection.     ROS  Past Surgical History:  Procedure Laterality Date   HERNIA REPAIR     SINUS EXPLORATION     STOMACH SURGERY  10/03/2001   Possible fundiplication?   TONSILLECTOMY     TOTAL HIP ARTHROPLASTY Right 02/14/2023   Procedure: TOTAL HIP ARTHROPLASTY ANTERIOR APPROACH;  Surgeon: Beverley Evalene BIRCH, MD;  Location: [facility];  Service: Orthopedics;  Laterality: Right;   TOTAL HIP ARTHROPLASTY Left 03/19/2024   Procedure: ARTHROPLASTY, HIP, TOTAL, ANTERIOR APPROACH;  Surgeon: Beverley Evalene BIRCH, MD;  Location: [facility];  Service: Orthopedics;  Laterality: Left;   TUBAL LIGATION      Outpatient Medications Prior to Visit  Medication Sig Dispense Refill   Acetaminophen  Extra Strength 500 MG CAPS SMARTSIG:2 Capsule(s) By Mouth Every 6 Hours PRN     albuterol  (VENTOLIN  HFA) 108 (90 Base) MCG/ACT inhaler Inhale 2 puffs into the lungs every 4 (four) hours as needed for wheezing or shortness of breath. 18 each 0   aspirin  81 MG chewable tablet Chew 81 mg by mouth 2 (two) times daily.     baclofen (LIORESAL) 10 MG tablet Take 10 mg by mouth every 6 (six) hours as needed.     cetirizine  (ZYRTEC ) 10 MG tablet Take 10 mg by mouth daily.     chlorhexidine  (HIBICLENS ) 4 % external liquid Apply 15 mLs as directed for 5 days every other week for 6 weeks. 946 mL 1   cromolyn (OPTICROM) 4 % ophthalmic solution Place 2 drops into both eyes 4 (four) times daily as needed. 10 mL 5   Cyanocobalamin  (VITAMIN B12 PO) Take 1 tablet by mouth daily.     DULoxetine  (CYMBALTA ) 60 MG capsule Take 1 capsule (60 mg total) by mouth daily. 30 capsule 2   esomeprazole  (NEXIUM ) 40 MG capsule Take 1 capsule (40 mg total) by mouth every morning. 30 capsule 5   famotidine  (PEPCID ) 40 MG tablet Take 1 tablet (40 mg total) by mouth at bedtime. 30 tablet 5   ferrous sulfate 325 (65 FE) MG EC tablet Take 325 mg by mouth daily.     fluticasone  (FLONASE ) 50 MCG/ACT nasal spray Place 2  sprays into both nostrils every morning. 2 sprays per nostril every day for stuffy  nose or drainage. 16 g 5   ipratropium-albuterol  (DUONEB) 0.5-2.5 (3) MG/3ML SOLN Take 3 mLs by nebulization every 4 (four) hours as needed. 360 mL 0   loratadine  (CLARITIN ) 10 MG tablet Take 1 tablet (10 mg total) by mouth daily. 30 tablet 0   medroxyPROGESTERone (PROVERA) 10 MG tablet Take 10 mg by mouth daily.     mometasone  (ELOCON ) 0.1 % ointment Apply topically daily. Apply once daily to red itchy areas or until resolved 135 g 1   mometasone -formoterol  (DULERA ) 200-5 MCG/ACT AERO Inhale 2 puffs into the lungs 2 (two) times daily. 1 each 5   montelukast  (SINGULAIR ) 10 MG tablet Take 1 tablet (10 mg total) by mouth at bedtime. 30 tablet 5   Multiple Vitamin (MULTIVITAMIN WITH MINERALS) TABS tablet Take 1 tablet by mouth daily.     mupirocin  ointment (BACTROBAN ) 2 % Use as directed 2 times daily for 5 days every other week for 6 weeks. 66 g 0   Olmesartan -amLODIPine -HCTZ 40-10-25 MG TABS Take 1 tablet by mouth daily. 90 tablet 3   ondansetron  (ZOFRAN -ODT) 4 MG disintegrating tablet Take 4 mg by mouth as needed.     oxyCODONE  (OXY IR/ROXICODONE ) 5 MG immediate release tablet Take 5 mg by mouth every 4 (four) hours as needed.     QUEtiapine  (SEROQUEL ) 100 MG tablet Take 1 tablet (100 mg total) by mouth at bedtime. 30 tablet 2   SENEXON-S 8.6-50 MG tablet Take 1 tablet by mouth 2 (two) times daily as needed.     meloxicam  (MOBIC ) 15 MG tablet Take 1 tablet (15 mg total) by mouth daily. For 2 weeks for pain and inflammation. Then take as needed (Patient taking differently: Take 15 mg by mouth daily as needed for pain.) 30 tablet 0   WEGOVY  0.5 MG/0.5ML SOAJ Inject 0.5 mg into the skin once a week.     tezepelumab -ekko (TEZSPIRE ) 210 MG/1. syringe Inject 1.91 mLs (210 mg total) into the skin every 28 (twenty-eight) days. (Patient not taking: Reported on 04/04/2024) 1.91 mL 11   Albuterol -Budesonide (AIRSUPRA ) 90-80  MCG/ACT AERO Inhale 2 puffs into the lungs every 4 (four) hours as needed (cough, wheezing). (Patient not taking: Reported on 04/04/2024) 10.7 g 3   No facility-administered medications prior to visit.    Family History  Problem Relation Age of Onset   Hypertension Mother    Cancer Maternal Aunt    Heart disease Neg Hx     Social History   Socioeconomic History   Marital status: Single    Spouse name: Not on file   Number of children: Not on file   Years of education: Not on file   Highest education level: Not on file  Occupational History   Not on file  Tobacco Use   Smoking status: Former    Types: Cigarettes    Passive exposure: Never   Smokeless tobacco: Never   Tobacco comments:    Smokes black and mild occassionally  Vaping Use   Vaping status: Never Used  Substance and Sexual Activity   Alcohol use: Not Currently    Alcohol/week: 2.0 standard drinks of alcohol    Types: 2 Cans of beer per week   Drug use: Yes    Frequency: 4.0 times per week    Types: Marijuana    Comment: edibles   Sexual activity: Yes    Birth control/protection: Surgical  Other Topics Concern   Not on file  Social History Narrative   Caffiene 2 days /  week 2 cans soda   Works not working.    Social Drivers of Corporate investment banker Strain: Not on file  Food Insecurity: Low Risk  (09/28/2023)   Received from [facility]   Hunger Vital Sign    Within the past 12 months, you worried that your food would run out before you got money to buy more: Never true    Within the past 12 months, the food you bought just didn't last and you didn't have money to get more. : Never true  Transportation Needs: No Transportation Needs (09/28/2023)   Received from [facility]    In the past 12 months, has lack of reliable transportation kept you from medical appointments, meetings, work or from getting things needed for daily living? : No  Physical Activity: Not on file   Stress: Not on file  Social Connections: Not on file  Intimate Partner Violence: Not on file                                                                                                  Objective:  Physical Exam: BP 118/70   Pulse 89   Temp 98.1 F (36.7 C)   Ht 5' 6 (1.676 m)   Wt 213 lb 9.6 oz (96.9 kg)   SpO2 96%   BMI 34.48 kg/m   Wt Readings from Last 3 Encounters:  04/04/24 213 lb 9.6 oz (96.9 kg)  04/01/24 215 lb (97.5 kg)  03/19/24 215 lb 6.4 oz (97.7 kg)    Physical Exam  GENERAL: Alert, cooperative, well developed, no acute distress HEENT: Normocephalic, normal oropharynx, moist mucous membranes CHEST: Clear to auscultation bilaterally, no wheezes, rhonchi, or crackles CARDIOVASCULAR: Normal heart rate and rhythm, S1 and S2 normal without murmurs ABDOMEN: Soft, non-tender, non-distended, without organomegaly, normal bowel sounds EXTREMITIES: No cyanosis or edema NEUROLOGICAL: Cranial nerves grossly intact, moves all extremities without gross motor or sensory deficit uses walker to ambulate due to limited motion of the left hip from pain   Physical Exam  DG HIP UNILAT WITH PELVIS 1V LEFT Result Date: 03/19/2024 CLINICAL DATA:  Elective surgery. Total left hip arthroplasty. Intraoperative fluoroscopy. EXAM: DG HIP (WITH OR WITHOUT PELVIS) 1V*L* COMPARISON:  AP pelvis 02/14/2023 FINDINGS: Images were performed intraoperatively without the presence of a radiologist. Severe severe left femoroacetabular joint space narrowing. The patient is undergoing total left hip arthroplasty. Partial visualization of prior total right hip arthroplasty. No hardware complication is seen. Total fluoroscopy images: 10 Total fluoroscopy time: 15 seconds Total dose: Radiation Exposure Index (as provided by the fluoroscopic device): 2.57 mGy air Kerma Please see intraoperative findings for further detail. IMPRESSION: Intraoperative fluoroscopy for total left hip arthroplasty.  Electronically Signed   By: Tanda Lyons M.D.   On: 03/19/2024 12:56   DG C-Arm 1-60 Min-No Report Result Date: 03/19/2024 Fluoroscopy was utilized by the requesting physician.  No radiographic interpretation.   DG C-Arm 1-60 Min-No Report Result Date: 03/19/2024 Fluoroscopy was utilized by the requesting physician.  No radiographic interpretation.   MYOCARDIAL PERFUSION IMAGING Result Date: 01/11/2024  The study is normal. The study is low risk.   No ST deviation was noted.   LV perfusion is normal. There is no evidence of ischemia. There is no evidence of infarction.   Left ventricular function is normal. Nuclear stress EF: 59%. The left ventricular ejection fraction is normal (55-65%). End diastolic cavity size is normal. End systolic cavity size is normal.   Prior study not available for comparison.    Recent Results (from the past 2160 hours)  TSH     Status: None   Collection Time: 01/10/24  9:07 AM  Result Value Ref Range   TSH 1.81 0.35 - 5.50 uIU/mL  Lipid panel     Status: Abnormal   Collection Time: 01/10/24  9:07 AM  Result Value Ref Range   Cholesterol 176 0 - 200 mg/dL    Comment: ATP III Classification       Desirable:  < 200 mg/dL               Borderline High:  200 - 239 mg/dL          High:  > = 759 mg/dL   Triglycerides 712.9 (H) 0.0 - 149.0 mg/dL    Comment: Normal:  <849 mg/dLBorderline High:  150 - 199 mg/dL   HDL 60.99 (L) >60.99 mg/dL   VLDL 42.5 (H) 0.0 - 59.9 mg/dL   LDL Cholesterol 80 0 - 99 mg/dL   Total CHOL/HDL Ratio 5     Comment:                Men          Women1/2 Average Risk     3.4          3.3Average Risk          5.0          4.42X Average Risk          9.6          7.13X Average Risk          15.0          11.0                       NonHDL 137.23     Comment: NOTE:  Non-HDL goal should be 30 mg/dL higher than patient's LDL goal (i.e. LDL goal of < 70 mg/dL, would have non-HDL goal of < 100 mg/dL)  Hemoglobin J8r     Status: None   Collection  Time: 01/10/24  9:07 AM  Result Value Ref Range   Hgb A1c MFr Bld 5.3 4.6 - 6.5 %    Comment: Glycemic Control Guidelines for People with Diabetes:Non Diabetic:  <6%Goal of Therapy: <7%Additional Action Suggested:  >8%   Comprehensive metabolic panel with GFR     Status: Abnormal   Collection Time: 01/10/24  9:07 AM  Result Value Ref Range   Sodium 141 135 - 145 mEq/L   Potassium 3.9 3.5 - 5.1 mEq/L   Chloride 105 96 - 112 mEq/L   CO2 27 19 - 32 mEq/L   Glucose, Bld 107 (H) 70 - 99 mg/dL   BUN 15 6 - 23 mg/dL   Creatinine, Ser 9.17 0.40 - 1.20 mg/dL   Total Bilirubin 0.3 0.2 - 1.2 mg/dL   Alkaline Phosphatase 58 39 - 117 U/L   AST 15 0 - 37 U/L   ALT 12 0 - 35 U/L   Total Protein 6.5 6.0 -  8.3 g/dL   Albumin 4.2 3.5 - 5.2 g/dL   GFR 14.98 >39.99 mL/min    Comment: Calculated using the CKD-EPI Creatinine Equation (2021)   Calcium 8.6 8.4 - 10.5 mg/dL  CBC with Differential/Platelet     Status: None   Collection Time: 01/10/24  9:07 AM  Result Value Ref Range   WBC 10.5 4.0 - 10.5 K/uL   RBC 4.54 3.87 - 5.11 Mil/uL   Hemoglobin 13.6 12.0 - 15.0 g/dL   HCT 59.0 63.9 - 53.9 %   MCV 90.1 78.0 - 100.0 fl   MCHC 33.2 30.0 - 36.0 g/dL   RDW 84.7 88.4 - 84.4 %   Platelets 241.0 150.0 - 400.0 K/uL   Neutrophils Relative % 68.8 43.0 - 77.0 %   Lymphocytes Relative 24.2 12.0 - 46.0 %   Monocytes Relative 5.0 3.0 - 12.0 %   Eosinophils Relative 1.7 0.0 - 5.0 %   Basophils Relative 0.3 0.0 - 3.0 %   Neutro Abs 7.2 1.4 - 7.7 K/uL   Lymphs Abs 2.5 0.7 - 4.0 K/uL   Monocytes Absolute 0.5 0.1 - 1.0 K/uL   Eosinophils Absolute 0.2 0.0 - 0.7 K/uL   Basophils Absolute 0.0 0.0 - 0.1 K/uL  Microalbumin / creatinine urine ratio     Status: Abnormal   Collection Time: 01/10/24  9:11 AM  Result Value Ref Range   Microalb, Ur 2.3 (H) 0.0 - 1.9 mg/dL   Creatinine,U 885.0 mg/dL   Microalb Creat Ratio 19.9 0.0 - 30.0 mg/g  Urinalysis w microscopic + reflex cultur     Status: Abnormal    Collection Time: 01/10/24  9:11 AM   Specimen: Urine  Result Value Ref Range   Color, Urine YELLOW YELLOW   APPearance CLEAR CLEAR   Specific Gravity, Urine 1.016 1.001 - 1.035   pH 6.5 5.0 - 8.0   Glucose, UA NEGATIVE NEGATIVE   Bilirubin Urine NEGATIVE NEGATIVE   Ketones, ur NEGATIVE NEGATIVE   Hgb urine dipstick 1+ (A) NEGATIVE   Protein, ur NEGATIVE NEGATIVE   Nitrites, Initial NEGATIVE NEGATIVE   Leukocyte Esterase 1+ (A) NEGATIVE   WBC, UA 20-40 (A) 0 - 5 /HPF   RBC / HPF NONE SEEN 0 - 2 /HPF   Squamous Epithelial / HPF 0-5 < OR = 5 /HPF   Bacteria, UA NONE SEEN NONE SEEN /HPF   Hyaline Cast NONE SEEN NONE SEEN /LPF   Note      Comment: This urine was analyzed for the presence of WBC,  RBC, bacteria, casts, and other formed elements.  Only those elements seen were reported. . .   Urine Culture     Status: None   Collection Time: 01/10/24  9:11 AM  Result Value Ref Range   MICRO NUMBER: [id number]    SPECIMEN QUALITY: Adequate    Sample Source URINE    STATUS: FINAL    Result:      Mixed genital flora isolated. These superficial bacteria are not indicative of a urinary tract infection. No further organism identification is warranted on this specimen. If clinically indicated, recollect clean-catch, mid-stream urine and transfer  immediately to Urine Culture Transport Tube.   REFLEXIVE URINE CULTURE     Status: None   Collection Time: 01/10/24  9:11 AM  Result Value Ref Range   REFLEXIVE URINE CULTURE      Comment: CULTURE INDICATED - RESULTS TO FOLLOW  MYOCARDIAL PERFUSION IMAGING     Status: None   Collection  Time: 01/11/24  1:44 PM  Result Value Ref Range   Rest Nuclear Isotope Dose 10.6 mCi   Stress Nuclear Isotope Dose 31.6 mCi   Rest HR 84.0 bpm   Rest BP 160/111 mmHg   Peak HR 111 bpm   Peak BP 170/106 mmHg   SSS 0.0    SRS 0.0    SDS 0.0    TID 1.00    LV sys vol 36.0 mL   LV dias vol 87.0 46 - 106 mL   Nuc Stress EF 59 %   ST Depression (mm) 0 mm   Urinalysis w microscopic + reflex cultur     Status: Abnormal   Collection Time: 01/17/24  2:30 PM   Specimen: Urine  Result Value Ref Range   Color, Urine YELLOW YELLOW   APPearance CLEAR CLEAR   Specific Gravity, Urine 1.011 1.001 - 1.035   pH 7.0 5.0 - 8.0   Glucose, UA NEGATIVE NEGATIVE   Bilirubin Urine NEGATIVE NEGATIVE   Ketones, ur NEGATIVE NEGATIVE   Hgb urine dipstick NEGATIVE NEGATIVE   Protein, ur NEGATIVE NEGATIVE   Nitrites, Initial NEGATIVE NEGATIVE   Leukocyte Esterase TRACE (A) NEGATIVE   WBC, UA 0-5 0 - 5 /HPF   RBC / HPF NONE SEEN 0 - 2 /HPF   Squamous Epithelial / HPF 0-5 < OR = 5 /HPF   Bacteria, UA NONE SEEN NONE SEEN /HPF   Hyaline Cast NONE SEEN NONE SEEN /LPF   Note      Comment: This urine was analyzed for the presence of WBC,  RBC, bacteria, casts, and other formed elements.  Only those elements seen were reported. . .   Urine Culture     Status: None   Collection Time: 01/17/24  2:30 PM  Result Value Ref Range   MICRO NUMBER: [id number]    SPECIMEN QUALITY: Adequate    Sample Source URINE    STATUS: FINAL    Result:      Less than 10,000 CFU/mL of single Gram positive organism isolated. No further testing will be performed. If clinically indicated, recollection using a method to minimize contamination, with prompt transfer to Urine Culture Transport Tube, is recommended.  REFLEXIVE URINE CULTURE     Status: None   Collection Time: 01/17/24  2:30 PM  Result Value Ref Range   REFLEXIVE URINE CULTURE      Comment: CULTURE INDICATED - RESULTS TO FOLLOW  POC COVID-19     Status: None   Collection Time: 01/17/24  3:13 PM  Result Value Ref Range   SARS Coronavirus 2 Ag Negative Negative  POC Influenza A&B (Binax test)     Status: None   Collection Time: 01/17/24  3:13 PM  Result Value Ref Range   Influenza A, POC Negative Negative   Influenza B, POC Negative Negative  Surgical pcr screen     Status: Abnormal   Collection Time: 03/07/24  8:45 AM    Specimen: Nasal Mucosa; Nasal Swab  Result Value Ref Range   MRSA, PCR NEGATIVE NEGATIVE   Staphylococcus aureus POSITIVE (A) NEGATIVE    Comment: (NOTE) The Xpert SA Assay (FDA approved for NASAL specimens in patients 10 years of age and older), is one component of a comprehensive surveillance program. It is not intended to diagnose infection nor to guide or monitor treatment. Performed at [facility], [address], [place], [place] [address]   Basic metabolic panel per protocol     Status: Abnormal  Collection Time: 03/07/24  8:45 AM  Result Value Ref Range   Sodium 137 135 - 145 mmol/L   Potassium 3.7 3.5 - 5.1 mmol/L   Chloride 108 98 - 111 mmol/L   CO2 22 22 - 32 mmol/L   Glucose, Bld 97 70 - 99 mg/dL    Comment: Glucose reference range applies only to samples taken after fasting for at least 8 hours.   BUN 15 6 - 20 mg/dL   Creatinine, Ser 9.40 0.44 - 1.00 mg/dL   Calcium 8.7 (L) 8.9 - 10.3 mg/dL   GFR, Estimated >39 >39 mL/min    Comment: (NOTE) Calculated using the CKD-EPI Creatinine Equation (2021)    Anion gap 7 5 - 15    Comment: Performed at [facility], [address] [place], [place] [address]  CBC per protocol     Status: None   Collection Time: 03/07/24  8:45 AM  Result Value Ref Range   WBC 8.6 4.0 - 10.5 K/uL   RBC 4.54 3.87 - 5.11 MIL/uL   Hemoglobin 13.2 12.0 - 15.0 g/dL   HCT 60.0 63.9 - 53.9 %   MCV 87.9 80.0 - 100.0 fL   MCH 29.1 26.0 - 34.0 pg   MCHC 33.1 30.0 - 36.0 g/dL   RDW 85.5 88.4 - 84.4 %   Platelets 223 150 - 400 K/uL   nRBC 0.0 0.0 - 0.2 %    Comment: Performed at [facility], [address], [place], [place] [address]  Type and screen [facility]     Status: None   Collection Time: 03/07/24  8:45 AM  Result Value Ref Range   ABO/RH(D) O POS    Antibody Screen NEG    Sample Expiration 03/21/2024,2359    Extend sample reason      NO TRANSFUSIONS OR  PREGNANCY IN THE PAST 3 MONTHS Performed at [facility], [address], [place], [place] [address]   Pregnancy, urine POC     Status: None   Collection Time: 03/19/24  6:11 AM  Result Value Ref Range   Preg Test, Ur NEGATIVE NEGATIVE    Comment:        THE SENSITIVITY OF THIS METHODOLOGY IS >24 mIU/mL         Beverley Adine Hummer, MD, MS

## 2024-04-08 ENCOUNTER — Encounter: Payer: Self-pay | Admitting: Family Medicine

## 2024-04-08 ENCOUNTER — Encounter: Payer: Self-pay | Admitting: Physical Therapy

## 2024-04-08 ENCOUNTER — Other Ambulatory Visit (HOSPITAL_COMMUNITY): Payer: Self-pay

## 2024-04-08 ENCOUNTER — Ambulatory Visit: Admitting: Physical Therapy

## 2024-04-08 ENCOUNTER — Encounter (HOSPITAL_COMMUNITY): Payer: Self-pay

## 2024-04-08 ENCOUNTER — Telehealth: Payer: Self-pay

## 2024-04-08 DIAGNOSIS — Z96642 Presence of left artificial hip joint: Secondary | ICD-10-CM | POA: Diagnosis present

## 2024-04-08 DIAGNOSIS — M25652 Stiffness of left hip, not elsewhere classified: Secondary | ICD-10-CM | POA: Diagnosis present

## 2024-04-08 DIAGNOSIS — M25552 Pain in left hip: Secondary | ICD-10-CM | POA: Diagnosis present

## 2024-04-08 NOTE — Telephone Encounter (Signed)
 Pharmacy Patient Advocate Encounter   Received notification from Onbase that prior authorization for Linzess  capsules  is required/requested.   Insurance verification completed.   The patient is insured through [facility] .   Per test claim: PA required; PA submitted to above mentioned insurance via CoverMyMeds Key/confirmation #/EOC [id number] Status is pending

## 2024-04-08 NOTE — Therapy (Signed)
 OUTPATIENT PHYSICAL THERAPY LOWER EXTREMITY EVALUATION   Patient Name: Renee Cross MRN: [id number] DOB:Mar 30, 1976, 48 y.o., female Today's Date: 04/08/2024  END OF SESSION:  PT End of Session - 04/08/24 1608     Visit Number 4    Date for PT Re-Evaluation 06/21/24    PT Start Time 1608    PT Stop Time 1645    PT Time Calculation (min) 37 min    Activity Tolerance No increased pain;Patient tolerated treatment well    Behavior During Therapy [facility] for tasks assessed/performed          Past Medical History:  Diagnosis Date   Abnormal uterine bleeding (AUB) 12/22/2022   Acute recurrent sinusitis 01/27/2021   Alcohol use disorder 01/14/2023   Amblyopia of left eye 12/22/2022   Anemia    Arthritis    Asthma 09/12/2023   B12 deficiency 01/14/2023   Bilateral knee pain 06/28/2023   Class 1 drug-induced obesity with serious comorbidity and body mass index (BMI) of 33.0 to 33.9 in adult 08/26/2023   Depression    Elevated rheumatoid factor 12/28/2021   Encounter for disability assessment 12/22/2022   Episode of recurrent major depressive disorder (HCC) 01/14/2023   Financial difficulty 01/14/2023   Gastroesophageal reflux disease 12/22/2022   Hiatal hernia with GERD    Hip arthritis 12/22/2022   History of suicide attempt 01/14/2023   Hypertension    Iron deficiency anemia due to chronic blood loss 03/31/2022   Left groin mass 09/17/2020   MDD (major depressive disorder), recurrent episode, moderate (HCC) 08/23/2023   Microcytic hypochromic anemia 12/28/2021   Multiple allergies 09/12/2023   Nasal polyp 01/27/2021   Polypectomy over 20 years ago.     Non-recurrent acute suppurative otitis media of left ear without spontaneous rupture of tympanic membrane 05/16/2023   Osteoarthritis of both hips 01/21/2022   Osteoarthritis of right knee 07/02/2023   Overuse of medication 12/22/2022   Perennial allergic rhinitis with seasonal variation 12/22/2022   Pneumonia    Primary  hypertension 12/22/2022   Primary osteoarthritis of both knees 01/21/2022   PTSD (post-traumatic stress disorder)    Rheumatoid arthritis (HCC) 12/22/2022   Screening for colon cancer 08/26/2023   Severe persistent asthma with exacerbation 12/22/2022   Sickle cell trait (HCC)    Tobacco use disorder 05/16/2023   Past Surgical History:  Procedure Laterality Date   HERNIA REPAIR     SINUS EXPLORATION     STOMACH SURGERY  10/03/2001   Possible fundiplication?   TONSILLECTOMY     TOTAL HIP ARTHROPLASTY Right 02/14/2023   Procedure: TOTAL HIP ARTHROPLASTY ANTERIOR APPROACH;  Surgeon: Beverley Evalene BIRCH, MD;  Location: [facility];  Service: Orthopedics;  Laterality: Right;   TOTAL HIP ARTHROPLASTY Left 03/19/2024   Procedure: ARTHROPLASTY, HIP, TOTAL, ANTERIOR APPROACH;  Surgeon: Beverley Evalene BIRCH, MD;  Location: [facility];  Service: Orthopedics;  Laterality: Left;   TUBAL LIGATION     Patient Active Problem List   Diagnosis Date Noted   Drug induced constipation 04/04/2024   MRSA (methicillin resistant Staphylococcus aureus) colonization 04/04/2024   Intrinsic atopic dermatitis 04/01/2024   Papular urticaria 04/01/2024   Dysfunction of both eustachian tubes 04/01/2024   S/P total left hip arthroplasty 03/19/2024   Asthma 09/12/2023   Multiple allergies 09/12/2023   Arthritis    Depression    Hiatal hernia with GERD    Hypertension    Pneumonia    PTSD (post-traumatic stress disorder)    Sickle cell trait (HCC)  Class 1 drug-induced obesity with serious comorbidity and body mass index (BMI) of 34.0 to 34.9 in adult 08/26/2023   Screening for colon cancer 08/26/2023   MDD (major depressive disorder), recurrent episode, moderate (HCC) 08/23/2023   Osteoarthritis of right knee 07/02/2023   Bilateral knee pain 06/28/2023   Non-recurrent acute suppurative otitis media of left ear without spontaneous rupture of tympanic membrane 05/16/2023   Tobacco use disorder 05/16/2023   Alcohol use  disorder 01/14/2023   Financial difficulty 01/14/2023   Episode of recurrent major depressive disorder (HCC) 01/14/2023   B12 deficiency 01/14/2023   History of suicide attempt 01/14/2023   Amblyopia of left eye 12/22/2022   Anemia 12/22/2022   Rheumatoid arthritis (HCC) 12/22/2022   Abnormal uterine bleeding (AUB) 12/22/2022   Severe persistent asthma with exacerbation 12/22/2022   Hip arthritis 12/22/2022   Gastroesophageal reflux disease 12/22/2022   Seasonal and perennial allergic rhinitis 12/22/2022   Encounter for disability assessment 12/22/2022   Overuse of medication 12/22/2022   Primary hypertension 12/22/2022   Iron deficiency anemia due to chronic blood loss 03/31/2022   Osteoarthritis of both hips 01/21/2022   Primary osteoarthritis of both knees 01/21/2022   Elevated rheumatoid factor 12/28/2021   Microcytic hypochromic anemia 12/28/2021   Acute recurrent sinusitis 01/27/2021   Nasal polyposis 01/27/2021   Left groin mass 09/17/2020    PCP: Beverley Hummer  REFERRING PROVIDER: Evalene Chancy  REFERRING DIAG: Post OP L Total Hip replacement   THERAPY DIAG:  Status post total hip replacement, left  Stiffness of left hip, not elsewhere classified  Pain in left hip  Rationale for Evaluation and Treatment: Rehabilitation  ONSET DATE: 03/19/24  SUBJECTIVE:   SUBJECTIVE STATEMENT: Pain, ache from all that' rainy weather   Doing the best of my ability, but it has been rough. There is a lot of pain, it is worse than when I got the R one done. I get cold sweats, I can't sleep, I can't eat. With the R one I didn't feel anything afterwards.   PERTINENT HISTORY: R total hip 02/14/23 L total hip 03/19/24   PAIN:  Are you having pain? Yes: NPRS scale: 6-7/10 Pain location: L hip and knee, sometimes the R side aches Pain description: constant ache  Aggravating factors: moving it Relieving factors: elevating   PRECAUTIONS: Anterior hip  RED  FLAGS: None   WEIGHT BEARING RESTRICTIONS: WBAT  FALLS:  Has patient fallen in last 6 months? No  LIVING ENVIRONMENT: Lives with: lives with their family Lives in: House/apartment Stairs: No Has following equipment at home: Single point cane and Environmental consultant - 2 wheeled  OCCUPATION: not working been out for 2 years   PLOF: Independent and Independent with basic ADLs  PATIENT GOALS: get me back to being independent   NEXT MD VISIT:   OBJECTIVE:  Note: Objective measures were completed at Evaluation unless otherwise noted.  DIAGNOSTIC FINDINGS: R knee OA and small tear in lateral meniscus   COGNITION: Overall cognitive status: Within functional limits for tasks assessed     SENSATION: WFL  POSTURE: flexed trunk  and weight shift right  PALPATION: TTP around L hip and incision  LOWER EXTREMITY ROM: unable to lift up L hip from seated position, knee ROM is normal  RLE all WFL    LOWER EXTREMITY MMT:  MMT Left eval  Hip flexion 2-  Hip extension   Hip abduction 3+  Hip adduction 3+  Hip internal rotation   Hip external rotation  Knee flexion 4-  Knee extension 3+  Ankle dorsiflexion   Ankle plantarflexion   Ankle inversion   Ankle eversion    (Blank rows = not tested)   FUNCTIONAL TESTS:  5 times sit to stand: 35s  Timed up and go (TUG): 34s with RW  GAIT: Distance walked: in clinic distances Assistive device utilized: Environmental consultant - 2 wheeled Level of assistance: Modified independence Comments: increase trunk flexion over walker, decreased stride length, decreased hip flexion on L, decreased stance time and step length on L, increased genu valgum bilaterally                                                                                                                                 TREATMENT DATE:  04/08/24 Bike L 2.5 x 6 min 20lb resisted gait 4 way x 3 each  4in step ups 2x5 each HS Curls 25lb 2x10 Leg Ext 10lb 2x10 Sit to stand 2x10 4in lateral  step ups   04/01/24 NuStep L 5 x 6 min HS curls 25lb 2x10, LLE 15 2x5 Leg Ext 10lb 2x10, RLE 5lb 2x5 Sit to stand form elevated surface 2x10 Standing in RW hip Ext & abd 2lb 2x10 each   03/28/24 L hip PROM with end range holds  L HS curls 2x10 green LAQ LLE 3lb 3x10 Sit to stand form elevated surface 2x10 Standing march in RW x10  X10 no AD Gait 79ft x3 no AD LLE weakness, then 34ft   03/22/24- EVAL    PATIENT EDUCATION:  Education details: POC, HEP, precautions Person educated: Patient Education method: Explanation and Demonstration Education comprehension: verbalized understanding  HOME EXERCISE PROGRAM: Access Code: [email or web address] URL: [email or web address] Date: 03/22/2024 Prepared by: Almetta Fam  Exercises - Sit to Stand  - 1 x daily - 7 x weekly - 2 sets - 10 reps - Standing Hip Abduction with Counter Support  - 1 x daily - 7 x weekly - 2 sets - 10 reps - Standing March with Counter Support  - 1 x daily - 7 x weekly - 2 sets - 10 reps - Seated Hip Adduction Isometrics with Ball  - 1 x daily - 7 x weekly - 2 sets - 10 reps - 5 hold  ASSESSMENT:  CLINICAL IMPRESSION: Patient is a 48 y.o. female who was seen today for physical therapy and treatment for L total hip replacement on 03/19/24. 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  She is ambulating with a rolling walker and has a cane at home. She enters ~ 8 minutes late for today's session. Session consisted of RLE strengthening and weight bearing activities with out AD. Added more functional interventions to today's session. Cues for eccentric control and equal step length with resisted gait. All interventions completed well. She will benefit from PT to address her L hip weakness and ROM deficits to be able to ambulate  independently and decrease her pain. She does have a limit of 27 visits.   OBJECTIVE IMPAIRMENTS: Abnormal gait, decreased activity tolerance,  difficulty walking, decreased ROM, decreased strength, improper body mechanics, and pain.   ACTIVITY LIMITATIONS: sitting, standing, squatting, sleeping, stairs, transfers, and locomotion level  PARTICIPATION LIMITATIONS: cleaning, laundry, driving, shopping, community activity, and yard work  PERSONAL FACTORS: Age, Past/current experiences, Time since onset of injury/illness/exacerbation, Transportation, and 1-2 comorbidities: OA, RA, HTN are also affecting patient's functional outcome.   REHAB POTENTIAL: Good  CLINICAL DECISION MAKING: Stable/uncomplicated  EVALUATION COMPLEXITY: Low   GOALS: Goals reviewed with patient? Yes  SHORT TERM GOALS: Target date: 05/10/24  Patient will be independent with initial HEP. Baseline:  Goal status: Met 04/08/24  2.  Patient will demonstrate TUG <25 with LRAD Baseline: 34s w/RW Goal status: INITIAL    LONG TERM GOALS: Target date: 06/21/24  Patient will be independent with advanced/ongoing HEP to improve outcomes and carryover.  Baseline:  Goal status: INITIAL  2.  Patient will report at least 75% improvement in L hip pain to improve QOL. (<3/10) Baseline: 10/10 Goal status: INITIAL  3.  Patient will demonstrate improved functional LE strength as demonstrated by 5xSTS <15s. Baseline: 35s from elevated mat Goal status: INITIAL  4.  Patient will be able to ambulate 500' with normal gait pattern without increased pain to access community.  Baseline: using RW at eval Goal status: INITIAL  5.  Patient will demonstrate TUG without AD <14s Baseline: 34s w/RW Goal status: INITIAL   PLAN:  PT FREQUENCY: 2x/week  PT DURATION: 12 weeks  PLANNED INTERVENTIONS: 97110-Therapeutic exercises, 97530- Therapeutic activity, 97112- Neuromuscular re-education, 97535- Self Care, [id number]- Manual therapy, [phone]- Gait training, [phone]- Vasopneumatic device, Patient/Family education, Balance training, Stair training, Joint mobilization, Spinal  mobilization, Cryotherapy, and Moist heat  PLAN FOR NEXT SESSION: gentle hip strengthening and stretching, SL stability to improve weight bearing on L, gait    Tanda KANDICE Sorrow, PTA 04/08/2024, 4:08 PM

## 2024-04-09 ENCOUNTER — Other Ambulatory Visit (HOSPITAL_COMMUNITY): Payer: Self-pay

## 2024-04-10 ENCOUNTER — Ambulatory Visit

## 2024-04-10 NOTE — Therapy (Incomplete)
 OUTPATIENT PHYSICAL THERAPY LOWER EXTREMITY EVALUATION   Patient Name: Renee Cross MRN: [id number] DOB:1976-08-22, 48 y.o., female Today's Date: 04/10/2024  END OF SESSION:    Past Medical History:  Diagnosis Date   Abnormal uterine bleeding (AUB) 12/22/2022   Acute recurrent sinusitis 01/27/2021   Alcohol use disorder 01/14/2023   Amblyopia of left eye 12/22/2022   Anemia    Arthritis    Asthma 09/12/2023   B12 deficiency 01/14/2023   Bilateral knee pain 06/28/2023   Class 1 drug-induced obesity with serious comorbidity and body mass index (BMI) of 33.0 to 33.9 in adult 08/26/2023   Depression    Elevated rheumatoid factor 12/28/2021   Encounter for disability assessment 12/22/2022   Episode of recurrent major depressive disorder (HCC) 01/14/2023   Financial difficulty 01/14/2023   Gastroesophageal reflux disease 12/22/2022   Hiatal hernia with GERD    Hip arthritis 12/22/2022   History of suicide attempt 01/14/2023   Hypertension    Iron deficiency anemia due to chronic blood loss 03/31/2022   Left groin mass 09/17/2020   MDD (major depressive disorder), recurrent episode, moderate (HCC) 08/23/2023   Microcytic hypochromic anemia 12/28/2021   Multiple allergies 09/12/2023   Nasal polyp 01/27/2021   Polypectomy over 20 years ago.     Non-recurrent acute suppurative otitis media of left ear without spontaneous rupture of tympanic membrane 05/16/2023   Osteoarthritis of both hips 01/21/2022   Osteoarthritis of right knee 07/02/2023   Overuse of medication 12/22/2022   Perennial allergic rhinitis with seasonal variation 12/22/2022   Pneumonia    Primary hypertension 12/22/2022   Primary osteoarthritis of both knees 01/21/2022   PTSD (post-traumatic stress disorder)    Rheumatoid arthritis (HCC) 12/22/2022   Screening for colon cancer 08/26/2023   Severe persistent asthma with exacerbation 12/22/2022   Sickle cell trait (HCC)    Tobacco use disorder 05/16/2023    Past Surgical History:  Procedure Laterality Date   HERNIA REPAIR     SINUS EXPLORATION     STOMACH SURGERY  10/03/2001   Possible fundiplication?   TONSILLECTOMY     TOTAL HIP ARTHROPLASTY Right 02/14/2023   Procedure: TOTAL HIP ARTHROPLASTY ANTERIOR APPROACH;  Surgeon: Beverley Evalene BIRCH, MD;  Location: [facility];  Service: Orthopedics;  Laterality: Right;   TOTAL HIP ARTHROPLASTY Left 03/19/2024   Procedure: ARTHROPLASTY, HIP, TOTAL, ANTERIOR APPROACH;  Surgeon: Beverley Evalene BIRCH, MD;  Location: [facility];  Service: Orthopedics;  Laterality: Left;   TUBAL LIGATION     Patient Active Problem List   Diagnosis Date Noted   Drug induced constipation 04/04/2024   MRSA (methicillin resistant Staphylococcus aureus) colonization 04/04/2024   Intrinsic atopic dermatitis 04/01/2024   Papular urticaria 04/01/2024   Dysfunction of both eustachian tubes 04/01/2024   S/P total left hip arthroplasty 03/19/2024   Asthma 09/12/2023   Multiple allergies 09/12/2023   Arthritis    Depression    Hiatal hernia with GERD    Hypertension    Pneumonia    PTSD (post-traumatic stress disorder)    Sickle cell trait (HCC)    Class 1 drug-induced obesity with serious comorbidity and body mass index (BMI) of 34.0 to 34.9 in adult 08/26/2023   Screening for colon cancer 08/26/2023   MDD (major depressive disorder), recurrent episode, moderate (HCC) 08/23/2023   Osteoarthritis of right knee 07/02/2023   Bilateral knee pain 06/28/2023   Non-recurrent acute suppurative otitis media of left ear without spontaneous rupture of tympanic membrane 05/16/2023   Tobacco use  disorder 05/16/2023   Alcohol use disorder 01/14/2023   Financial difficulty 01/14/2023   Episode of recurrent major depressive disorder (HCC) 01/14/2023   B12 deficiency 01/14/2023   History of suicide attempt 01/14/2023   Amblyopia of left eye 12/22/2022   Anemia 12/22/2022   Rheumatoid arthritis (HCC) 12/22/2022   Abnormal uterine bleeding  (AUB) 12/22/2022   Severe persistent asthma with exacerbation 12/22/2022   Hip arthritis 12/22/2022   Gastroesophageal reflux disease 12/22/2022   Seasonal and perennial allergic rhinitis 12/22/2022   Encounter for disability assessment 12/22/2022   Overuse of medication 12/22/2022   Primary hypertension 12/22/2022   Iron deficiency anemia due to chronic blood loss 03/31/2022   Osteoarthritis of both hips 01/21/2022   Primary osteoarthritis of both knees 01/21/2022   Elevated rheumatoid factor 12/28/2021   Microcytic hypochromic anemia 12/28/2021   Acute recurrent sinusitis 01/27/2021   Nasal polyposis 01/27/2021   Left groin mass 09/17/2020    PCP: Beverley Hummer  REFERRING PROVIDER: Evalene Chancy  REFERRING DIAG: Post OP L Total Hip replacement   THERAPY DIAG:  No diagnosis found.  Rationale for Evaluation and Treatment: Rehabilitation  ONSET DATE: 03/19/24  SUBJECTIVE:   SUBJECTIVE STATEMENT: Pain, ache from all that' rainy weather   Doing the best of my ability, but it has been rough. There is a lot of pain, it is worse than when I got the R one done. I get cold sweats, I can't sleep, I can't eat. With the R one I didn't feel anything afterwards.   PERTINENT HISTORY: R total hip 02/14/23 L total hip 03/19/24   PAIN:  Are you having pain? Yes: NPRS scale: 6-7/10 Pain location: L hip and knee, sometimes the R side aches Pain description: constant ache  Aggravating factors: moving it Relieving factors: elevating   PRECAUTIONS: Anterior hip  RED FLAGS: None   WEIGHT BEARING RESTRICTIONS: WBAT  FALLS:  Has patient fallen in last 6 months? No  LIVING ENVIRONMENT: Lives with: lives with their family Lives in: House/apartment Stairs: No Has following equipment at home: Single point cane and Environmental consultant - 2 wheeled  OCCUPATION: not working been out for 2 years   PLOF: Independent and Independent with basic ADLs  PATIENT GOALS: get me back to being  independent   NEXT MD VISIT:   OBJECTIVE:  Note: Objective measures were completed at Evaluation unless otherwise noted.  DIAGNOSTIC FINDINGS: R knee OA and small tear in lateral meniscus   COGNITION: Overall cognitive status: Within functional limits for tasks assessed     SENSATION: WFL  POSTURE: flexed trunk  and weight shift right  PALPATION: TTP around L hip and incision  LOWER EXTREMITY ROM: unable to lift up L hip from seated position, knee ROM is normal  RLE all WFL    LOWER EXTREMITY MMT:  MMT Left eval  Hip flexion 2-  Hip extension   Hip abduction 3+  Hip adduction 3+  Hip internal rotation   Hip external rotation   Knee flexion 4-  Knee extension 3+  Ankle dorsiflexion   Ankle plantarflexion   Ankle inversion   Ankle eversion    (Blank rows = not tested)   FUNCTIONAL TESTS:  5 times sit to stand: 35s  Timed up and go (TUG): 34s with RW  GAIT: Distance walked: in clinic distances Assistive device utilized: Environmental consultant - 2 wheeled Level of assistance: Modified independence Comments: increase trunk flexion over walker, decreased stride length, decreased hip flexion on L, decreased stance time and  step length on L, increased genu valgum bilaterally                                                                                                                                 TREATMENT DATE:  04/10/24 TUG without walker NuStep Step ups 4 Leg ext HS curls Leg press STS  04/08/24 Bike L 2.5 x 6 min 20lb resisted gait 4 way x 3 each  4in step ups 2x5 each HS Curls 25lb 2x10 Leg Ext 10lb 2x10 Sit to stand 2x10 4in lateral step ups   04/01/24 NuStep L 5 x 6 min HS curls 25lb 2x10, LLE 15 2x5 Leg Ext 10lb 2x10, RLE 5lb 2x5 Sit to stand form elevated surface 2x10 Standing in RW hip Ext & abd 2lb 2x10 each   03/28/24 L hip PROM with end range holds  L HS curls 2x10 green LAQ LLE 3lb 3x10 Sit to stand form elevated surface 2x10 Standing march  in RW x10  X10 no AD Gait 33ft x3 no AD LLE weakness, then 41ft   03/22/24- EVAL    PATIENT EDUCATION:  Education details: POC, HEP, precautions Person educated: Patient Education method: Explanation and Demonstration Education comprehension: verbalized understanding  HOME EXERCISE PROGRAM: Access Code: [email or web address] URL: [email or web address] Date: 03/22/2024 Prepared by: Almetta Fam  Exercises - Sit to Stand  - 1 x daily - 7 x weekly - 2 sets - 10 reps - Standing Hip Abduction with Counter Support  - 1 x daily - 7 x weekly - 2 sets - 10 reps - Standing March with Counter Support  - 1 x daily - 7 x weekly - 2 sets - 10 reps - Seated Hip Adduction Isometrics with Ball  - 1 x daily - 7 x weekly - 2 sets - 10 reps - 5 hold  ASSESSMENT:  CLINICAL IMPRESSION: Patient is a 48 y.o. female who was seen today for physical therapy and treatment for L total hip replacement on 03/19/24. 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  She is ambulating with a rolling walker and has a cane at home. She enters ~ 8 minutes late for today's session. Session consisted of RLE strengthening and weight bearing activities with out AD. Added more functional interventions to today's session. Cues for eccentric control and equal step length with resisted gait. All interventions completed well. She will benefit from PT to address her L hip weakness and ROM deficits to be able to ambulate independently and decrease her pain. She does have a limit of 27 visits.   OBJECTIVE IMPAIRMENTS: Abnormal gait, decreased activity tolerance, difficulty walking, decreased ROM, decreased strength, improper body mechanics, and pain.   ACTIVITY LIMITATIONS: sitting, standing, squatting, sleeping, stairs, transfers, and locomotion level  PARTICIPATION LIMITATIONS: cleaning, laundry, driving, shopping, community activity, and yard work  PERSONAL FACTORS: Age, Past/current  experiences, Time since onset of injury/illness/exacerbation, Transportation,  and 1-2 comorbidities: OA, RA, HTN are also affecting patient's functional outcome.   REHAB POTENTIAL: Good  CLINICAL DECISION MAKING: Stable/uncomplicated  EVALUATION COMPLEXITY: Low   GOALS: Goals reviewed with patient? Yes  SHORT TERM GOALS: Target date: 05/10/24  Patient will be independent with initial HEP. Baseline:  Goal status: Met 04/08/24  2.  Patient will demonstrate TUG <25 with LRAD Baseline: 34s w/RW Goal status: INITIAL    LONG TERM GOALS: Target date: 06/21/24  Patient will be independent with advanced/ongoing HEP to improve outcomes and carryover.  Baseline:  Goal status: INITIAL  2.  Patient will report at least 75% improvement in L hip pain to improve QOL. (<3/10) Baseline: 10/10 Goal status: INITIAL  3.  Patient will demonstrate improved functional LE strength as demonstrated by 5xSTS <15s. Baseline: 35s from elevated mat Goal status: INITIAL  4.  Patient will be able to ambulate 500' with normal gait pattern without increased pain to access community.  Baseline: using RW at eval Goal status: INITIAL  5.  Patient will demonstrate TUG without AD <14s Baseline: 34s w/RW Goal status: INITIAL   PLAN:  PT FREQUENCY: 2x/week  PT DURATION: 12 weeks  PLANNED INTERVENTIONS: 97110-Therapeutic exercises, 97530- Therapeutic activity, 97112- Neuromuscular re-education, 97535- Self Care, 02859- Manual therapy, [phone]- Gait training, [phone]- Vasopneumatic device, Patient/Family education, Balance training, Stair training, Joint mobilization, Spinal mobilization, Cryotherapy, and Moist heat  PLAN FOR NEXT SESSION: gentle hip strengthening and stretching, SL stability to improve weight bearing on L, gait    [place], PT 04/10/2024, 8:17 AM

## 2024-04-11 NOTE — Telephone Encounter (Signed)
 Pharmacy Patient Advocate Encounter  Received notification from [facility] that Prior Authorization for Linzess  capsules  has been DENIED.  See denial reason below. No denial letter attached in CMM. Will attach denial letter to Media tab once received.   PA #/Case ID/Reference #: [id number]

## 2024-04-13 DIAGNOSIS — Z419 Encounter for procedure for purposes other than remedying health state, unspecified: Secondary | ICD-10-CM | POA: Diagnosis not present

## 2024-04-15 ENCOUNTER — Ambulatory Visit

## 2024-04-15 NOTE — Therapy (Incomplete)
 OUTPATIENT PHYSICAL THERAPY LOWER EXTREMITY EVALUATION   Patient Name: Renee Cross MRN: [id number] DOB:09/13/1976, 48 y.o., female Today's Date: 04/15/2024  END OF SESSION:    Past Medical History:  Diagnosis Date   Abnormal uterine bleeding (AUB) 12/22/2022   Acute recurrent sinusitis 01/27/2021   Alcohol use disorder 01/14/2023   Amblyopia of left eye 12/22/2022   Anemia    Arthritis    Asthma 09/12/2023   B12 deficiency 01/14/2023   Bilateral knee pain 06/28/2023   Class 1 drug-induced obesity with serious comorbidity and body mass index (BMI) of 33.0 to 33.9 in adult 08/26/2023   Depression    Elevated rheumatoid factor 12/28/2021   Encounter for disability assessment 12/22/2022   Episode of recurrent major depressive disorder (HCC) 01/14/2023   Financial difficulty 01/14/2023   Gastroesophageal reflux disease 12/22/2022   Hiatal hernia with GERD    Hip arthritis 12/22/2022   History of suicide attempt 01/14/2023   Hypertension    Iron deficiency anemia due to chronic blood loss 03/31/2022   Left groin mass 09/17/2020   MDD (major depressive disorder), recurrent episode, moderate (HCC) 08/23/2023   Microcytic hypochromic anemia 12/28/2021   Multiple allergies 09/12/2023   Nasal polyp 01/27/2021   Polypectomy over 20 years ago.     Non-recurrent acute suppurative otitis media of left ear without spontaneous rupture of tympanic membrane 05/16/2023   Osteoarthritis of both hips 01/21/2022   Osteoarthritis of right knee 07/02/2023   Overuse of medication 12/22/2022   Perennial allergic rhinitis with seasonal variation 12/22/2022   Pneumonia    Primary hypertension 12/22/2022   Primary osteoarthritis of both knees 01/21/2022   PTSD (post-traumatic stress disorder)    Rheumatoid arthritis (HCC) 12/22/2022   Screening for colon cancer 08/26/2023   Severe persistent asthma with exacerbation 12/22/2022   Sickle cell trait (HCC)    Tobacco use disorder 05/16/2023    Past Surgical History:  Procedure Laterality Date   HERNIA REPAIR     SINUS EXPLORATION     STOMACH SURGERY  10/03/2001   Possible fundiplication?   TONSILLECTOMY     TOTAL HIP ARTHROPLASTY Right 02/14/2023   Procedure: TOTAL HIP ARTHROPLASTY ANTERIOR APPROACH;  Surgeon: Beverley Evalene BIRCH, MD;  Location: [facility];  Service: Orthopedics;  Laterality: Right;   TOTAL HIP ARTHROPLASTY Left 03/19/2024   Procedure: ARTHROPLASTY, HIP, TOTAL, ANTERIOR APPROACH;  Surgeon: Beverley Evalene BIRCH, MD;  Location: [facility];  Service: Orthopedics;  Laterality: Left;   TUBAL LIGATION     Patient Active Problem List   Diagnosis Date Noted   Drug induced constipation 04/04/2024   MRSA (methicillin resistant Staphylococcus aureus) colonization 04/04/2024   Intrinsic atopic dermatitis 04/01/2024   Papular urticaria 04/01/2024   Dysfunction of both eustachian tubes 04/01/2024   S/P total left hip arthroplasty 03/19/2024   Asthma 09/12/2023   Multiple allergies 09/12/2023   Arthritis    Depression    Hiatal hernia with GERD    Hypertension    Pneumonia    PTSD (post-traumatic stress disorder)    Sickle cell trait (HCC)    Class 1 drug-induced obesity with serious comorbidity and body mass index (BMI) of 34.0 to 34.9 in adult 08/26/2023   Screening for colon cancer 08/26/2023   MDD (major depressive disorder), recurrent episode, moderate (HCC) 08/23/2023   Osteoarthritis of right knee 07/02/2023   Bilateral knee pain 06/28/2023   Non-recurrent acute suppurative otitis media of left ear without spontaneous rupture of tympanic membrane 05/16/2023   Tobacco use  disorder 05/16/2023   Alcohol use disorder 01/14/2023   Financial difficulty 01/14/2023   Episode of recurrent major depressive disorder (HCC) 01/14/2023   B12 deficiency 01/14/2023   History of suicide attempt 01/14/2023   Amblyopia of left eye 12/22/2022   Anemia 12/22/2022   Rheumatoid arthritis (HCC) 12/22/2022   Abnormal uterine bleeding  (AUB) 12/22/2022   Severe persistent asthma with exacerbation 12/22/2022   Hip arthritis 12/22/2022   Gastroesophageal reflux disease 12/22/2022   Seasonal and perennial allergic rhinitis 12/22/2022   Encounter for disability assessment 12/22/2022   Overuse of medication 12/22/2022   Primary hypertension 12/22/2022   Iron deficiency anemia due to chronic blood loss 03/31/2022   Osteoarthritis of both hips 01/21/2022   Primary osteoarthritis of both knees 01/21/2022   Elevated rheumatoid factor 12/28/2021   Microcytic hypochromic anemia 12/28/2021   Acute recurrent sinusitis 01/27/2021   Nasal polyposis 01/27/2021   Left groin mass 09/17/2020    PCP: Beverley Hummer  REFERRING PROVIDER: Evalene Chancy  REFERRING DIAG: Post OP L Total Hip replacement   THERAPY DIAG:  No diagnosis found.  Rationale for Evaluation and Treatment: Rehabilitation  ONSET DATE: 03/19/24  SUBJECTIVE:   SUBJECTIVE STATEMENT: Pain, ache from all that' rainy weather   Doing the best of my ability, but it has been rough. There is a lot of pain, it is worse than when I got the R one done. I get cold sweats, I can't sleep, I can't eat. With the R one I didn't feel anything afterwards.   PERTINENT HISTORY: R total hip 02/14/23 L total hip 03/19/24   PAIN:  Are you having pain? Yes: NPRS scale: 6-7/10 Pain location: L hip and knee, sometimes the R side aches Pain description: constant ache  Aggravating factors: moving it Relieving factors: elevating   PRECAUTIONS: Anterior hip  RED FLAGS: None   WEIGHT BEARING RESTRICTIONS: WBAT  FALLS:  Has patient fallen in last 6 months? No  LIVING ENVIRONMENT: Lives with: lives with their family Lives in: House/apartment Stairs: No Has following equipment at home: Single point cane and Environmental consultant - 2 wheeled  OCCUPATION: not working been out for 2 years   PLOF: Independent and Independent with basic ADLs  PATIENT GOALS: get me back to being  independent   NEXT MD VISIT:   OBJECTIVE:  Note: Objective measures were completed at Evaluation unless otherwise noted.  DIAGNOSTIC FINDINGS: R knee OA and small tear in lateral meniscus   COGNITION: Overall cognitive status: Within functional limits for tasks assessed     SENSATION: WFL  POSTURE: flexed trunk  and weight shift right  PALPATION: TTP around L hip and incision  LOWER EXTREMITY ROM: unable to lift up L hip from seated position, knee ROM is normal  RLE all WFL    LOWER EXTREMITY MMT:  MMT Left eval  Hip flexion 2-  Hip extension   Hip abduction 3+  Hip adduction 3+  Hip internal rotation   Hip external rotation   Knee flexion 4-  Knee extension 3+  Ankle dorsiflexion   Ankle plantarflexion   Ankle inversion   Ankle eversion    (Blank rows = not tested)   FUNCTIONAL TESTS:  5 times sit to stand: 35s  Timed up and go (TUG): 34s with RW  GAIT: Distance walked: in clinic distances Assistive device utilized: Environmental consultant - 2 wheeled Level of assistance: Modified independence Comments: increase trunk flexion over walker, decreased stride length, decreased hip flexion on L, decreased stance time and  step length on L, increased genu valgum bilaterally                                                                                                                                 TREATMENT DATE:  04/15/24 TUG without walker NuStep Step ups 4 Leg ext HS curls Leg press STS  04/08/24 Bike L 2.5 x 6 min 20lb resisted gait 4 way x 3 each  4in step ups 2x5 each HS Curls 25lb 2x10 Leg Ext 10lb 2x10 Sit to stand 2x10 4in lateral step ups   04/01/24 NuStep L 5 x 6 min HS curls 25lb 2x10, LLE 15 2x5 Leg Ext 10lb 2x10, RLE 5lb 2x5 Sit to stand form elevated surface 2x10 Standing in RW hip Ext & abd 2lb 2x10 each   03/28/24 L hip PROM with end range holds  L HS curls 2x10 green LAQ LLE 3lb 3x10 Sit to stand form elevated surface 2x10 Standing march  in RW x10  X10 no AD Gait 59ft x3 no AD LLE weakness, then 33ft   03/22/24- EVAL    PATIENT EDUCATION:  Education details: POC, HEP, precautions Person educated: Patient Education method: Explanation and Demonstration Education comprehension: verbalized understanding  HOME EXERCISE PROGRAM: Access Code: [email or web address] URL: [email or web address] Date: 03/22/2024 Prepared by: Almetta Fam  Exercises - Sit to Stand  - 1 x daily - 7 x weekly - 2 sets - 10 reps - Standing Hip Abduction with Counter Support  - 1 x daily - 7 x weekly - 2 sets - 10 reps - Standing March with Counter Support  - 1 x daily - 7 x weekly - 2 sets - 10 reps - Seated Hip Adduction Isometrics with Ball  - 1 x daily - 7 x weekly - 2 sets - 10 reps - 5 hold  ASSESSMENT:  CLINICAL IMPRESSION: Patient is a 48 y.o. female who was seen today for physical therapy and treatment for L total hip replacement on 03/19/24. 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  She is ambulating with a rolling walker and has a cane at home. She enters ~ 8 minutes late for today's session. Session consisted of RLE strengthening and weight bearing activities with out AD. Added more functional interventions to today's session. Cues for eccentric control and equal step length with resisted gait. All interventions completed well. She will benefit from PT to address her L hip weakness and ROM deficits to be able to ambulate independently and decrease her pain. She does have a limit of 27 visits.   OBJECTIVE IMPAIRMENTS: Abnormal gait, decreased activity tolerance, difficulty walking, decreased ROM, decreased strength, improper body mechanics, and pain.   ACTIVITY LIMITATIONS: sitting, standing, squatting, sleeping, stairs, transfers, and locomotion level  PARTICIPATION LIMITATIONS: cleaning, laundry, driving, shopping, community activity, and yard work  PERSONAL FACTORS: Age, Past/current  experiences, Time since onset of injury/illness/exacerbation, Transportation,  and 1-2 comorbidities: OA, RA, HTN are also affecting patient's functional outcome.   REHAB POTENTIAL: Good  CLINICAL DECISION MAKING: Stable/uncomplicated  EVALUATION COMPLEXITY: Low   GOALS: Goals reviewed with patient? Yes  SHORT TERM GOALS: Target date: 05/10/24  Patient will be independent with initial HEP. Baseline:  Goal status: Met 04/08/24  2.  Patient will demonstrate TUG <25 with LRAD Baseline: 34s w/RW Goal status: INITIAL    LONG TERM GOALS: Target date: 06/21/24  Patient will be independent with advanced/ongoing HEP to improve outcomes and carryover.  Baseline:  Goal status: INITIAL  2.  Patient will report at least 75% improvement in L hip pain to improve QOL. (<3/10) Baseline: 10/10 Goal status: INITIAL  3.  Patient will demonstrate improved functional LE strength as demonstrated by 5xSTS <15s. Baseline: 35s from elevated mat Goal status: INITIAL  4.  Patient will be able to ambulate 500' with normal gait pattern without increased pain to access community.  Baseline: using RW at eval Goal status: INITIAL  5.  Patient will demonstrate TUG without AD <14s Baseline: 34s w/RW Goal status: INITIAL   PLAN:  PT FREQUENCY: 2x/week  PT DURATION: 12 weeks  PLANNED INTERVENTIONS: 97110-Therapeutic exercises, 97530- Therapeutic activity, 97112- Neuromuscular re-education, 97535- Self Care, 02859- Manual therapy, [phone]- Gait training, [phone]- Vasopneumatic device, Patient/Family education, Balance training, Stair training, Joint mobilization, Spinal mobilization, Cryotherapy, and Moist heat  PLAN FOR NEXT SESSION: gentle hip strengthening and stretching, SL stability to improve weight bearing on L, gait    [place], PT 04/15/2024, 11:42 AM

## 2024-04-17 ENCOUNTER — Ambulatory Visit

## 2024-04-22 ENCOUNTER — Ambulatory Visit

## 2024-04-24 ENCOUNTER — Ambulatory Visit

## 2024-04-30 ENCOUNTER — Telehealth (HOSPITAL_BASED_OUTPATIENT_CLINIC_OR_DEPARTMENT_OTHER): Payer: Self-pay | Admitting: Psychiatry

## 2024-04-30 ENCOUNTER — Telehealth (HOSPITAL_COMMUNITY): Payer: Self-pay | Admitting: Psychiatry

## 2024-04-30 ENCOUNTER — Encounter (HOSPITAL_COMMUNITY): Payer: Self-pay

## 2024-04-30 ENCOUNTER — Encounter (HOSPITAL_COMMUNITY): Payer: Self-pay | Admitting: Psychiatry

## 2024-04-30 DIAGNOSIS — Z91199 Patient's noncompliance with other medical treatment and regimen due to unspecified reason: Secondary | ICD-10-CM

## 2024-04-30 NOTE — Telephone Encounter (Signed)
 Per provider, patient has been dismissed from practice due to multiple missed appointments. Dismissal letter has been sent via mychart and certified mail on 04/30/2024.

## 2024-04-30 NOTE — Progress Notes (Signed)
 Patient no-show on video platform.  I called at [phone] but no one pick up the phone.  Patient has no show multiple times recently.  We will close out her chart.

## 2024-05-07 ENCOUNTER — Other Ambulatory Visit: Payer: Self-pay | Admitting: Family Medicine

## 2024-05-07 ENCOUNTER — Ambulatory Visit: Admitting: Family Medicine

## 2024-05-07 DIAGNOSIS — Z96642 Presence of left artificial hip joint: Secondary | ICD-10-CM

## 2024-05-07 DIAGNOSIS — M160 Bilateral primary osteoarthritis of hip: Secondary | ICD-10-CM

## 2024-05-09 ENCOUNTER — Telehealth: Payer: Self-pay | Admitting: Family Medicine

## 2024-05-09 NOTE — Telephone Encounter (Signed)
 12/29/2023 late arrival & 01/02/2024 late/same day cancellation  05/07/2024 no show  Dismissal generated. Sent dismissal via [employer].

## 2024-05-14 DIAGNOSIS — Z419 Encounter for procedure for purposes other than remedying health state, unspecified: Secondary | ICD-10-CM | POA: Diagnosis not present

## 2024-05-28 ENCOUNTER — Encounter: Payer: Self-pay | Admitting: Allergy and Immunology

## 2024-05-28 ENCOUNTER — Ambulatory Visit (INDEPENDENT_AMBULATORY_CARE_PROVIDER_SITE_OTHER): Admitting: Allergy and Immunology

## 2024-05-28 DIAGNOSIS — J3089 Other allergic rhinitis: Secondary | ICD-10-CM | POA: Diagnosis not present

## 2024-05-29 NOTE — Progress Notes (Signed)
 Maddyson presents to this clinic to have skin testing performed.  Allergy  skin testing did not identify any hypersensitivity against a screening panel of foods or aeroallergens.

## 2024-06-05 ENCOUNTER — Other Ambulatory Visit: Payer: Self-pay | Admitting: Family Medicine

## 2024-06-05 DIAGNOSIS — E66811 Obesity, class 1: Secondary | ICD-10-CM

## 2024-06-12 ENCOUNTER — Ambulatory Visit (INDEPENDENT_AMBULATORY_CARE_PROVIDER_SITE_OTHER): Admitting: Family Medicine

## 2024-06-12 ENCOUNTER — Encounter: Payer: Self-pay | Admitting: Family Medicine

## 2024-06-12 ENCOUNTER — Other Ambulatory Visit: Payer: Self-pay | Admitting: Family Medicine

## 2024-06-12 VITALS — BP 141/82 | HR 86 | Temp 98.2°F | Resp 18 | Ht 66.0 in | Wt 216.8 lb

## 2024-06-12 DIAGNOSIS — R059 Cough, unspecified: Secondary | ICD-10-CM | POA: Diagnosis not present

## 2024-06-12 DIAGNOSIS — U071 COVID-19: Secondary | ICD-10-CM

## 2024-06-12 DIAGNOSIS — E66811 Obesity, class 1: Secondary | ICD-10-CM

## 2024-06-12 LAB — POC COVID19 BINAXNOW: SARS Coronavirus 2 Ag: POSITIVE — AB

## 2024-06-12 MED ORDER — METHYLPREDNISOLONE 4 MG PO TABS: ORAL_TABLET | ORAL | 0 refills | Status: AC

## 2024-06-12 MED ORDER — PROMETHAZINE-DM 6.25-15 MG/5ML PO SYRP: 5.0000 mL | ORAL_SOLUTION | Freq: Four times a day (QID) | ORAL | 0 refills | Status: AC | PRN

## 2024-06-12 NOTE — Progress Notes (Signed)
 Acute Office Visit  Subjective:     Patient ID: Renee Cross, female    DOB: August 17, 1976, 48 y.o.   MRN: [id number]  Chief Complaint  Patient presents with   Cough    HA , runny nose onset: 2-3 days    Cough   Patient is in today for cough.  Discussed the use of AI scribe software for clinical note transcription with the patient, who gave verbal consent to proceed.  History of Present Illness Renee Cross is a 48 year old female with asthma who presents with respiratory symptoms and headache.  She has been experiencing respiratory symptoms and headache for the past two days, including body aches and headache, with no chest pain. Symptoms worsen when lying down, particularly wheezing and cough, and she sometimes produces clear sputum when coughing.  She has a history of asthma and uses an albuterol  inhaler, which she has been using recently.   She denies fever, hemoptysis, chest pain.  She has not been around anyone known to be sick recently.           All review of systems negative except what is listed in the HPI      Objective:    BP (!) 141/82   Pulse 86   Temp 98.2 F (36.8 C)   Resp 18   Ht 5' 6 (1.676 m)   Wt 216 lb 12.8 oz (98.3 kg)   SpO2 96%   BMI 34.99 kg/m    Physical Exam Vitals reviewed.  Constitutional:      Appearance: Normal appearance.  Cardiovascular:     Rate and Rhythm: Normal rate and regular rhythm.     Heart sounds: Normal heart sounds.  Pulmonary:     Effort: Pulmonary effort is normal.     Breath sounds: Normal breath sounds. No wheezing, rhonchi or rales.  Skin:    General: Skin is warm and dry.  Neurological:     Mental Status: She is alert and oriented to person, place, and time.  Psychiatric:        Mood and Affect: Mood normal.        Behavior: Behavior normal.        Thought Content: Thought content normal.        Judgment: Judgment normal.             Results for orders placed or performed  in visit on 06/12/24  POC COVID-19 BinaxNow  Result Value Ref Range   SARS Coronavirus 2 Ag Positive (A) Negative        Assessment & Plan:   Problem List Items Addressed This Visit   None Visit Diagnoses       Cough, unspecified type    -  Primary   Relevant Orders   POC COVID-19 BinaxNow (Completed)     COVID-19       Relevant Medications   promethazine -dextromethorphan  (PROMETHAZINE -DM) 6.25-15 MG/5ML syrup   methylPREDNISolone  (MEDROL ) 4 MG tablet       Assessment & Plan COVID-19 infection in a patient with asthma COVID positive in office. Symptoms include headache, body aches, cough with clear sputum, and wheezing. Concern for respiratory complications due to asthma.  Antiviral treatment not initiated due to potential interactions with current meds. Emphasized supportive care and monitoring for symptom worsening. - Prescribe steroid pack and cough syrup - Advise supportive care including Mucinex , warm compresses, increased fluid intake, rest, and Tylenol . - Instruct to isolate for five days and wear a  mask until day ten. - Advise to seek medical attention if experiencing difficulty breathing, hemoptysis, or symptom worsening.      Meds ordered this encounter  Medications   promethazine -dextromethorphan  (PROMETHAZINE -DM) 6.25-15 MG/5ML syrup    Sig: Take 5 mLs by mouth 4 (four) times daily as needed for up to 5 days for cough.    Dispense:  100 mL    Refill:  0    Supervising Provider:   DOMENICA BLACKBIRD A [[id number]]   methylPREDNISolone  (MEDROL ) 4 MG tablet    Sig: Standard 6 day taper    Dispense:  21 tablet    Refill:  0    Supervising Provider:   DOMENICA BLACKBIRD A [[id number]]    Return if symptoms worsen or fail to improve.  Waddell KATHEE Mon, NP

## 2024-07-02 ENCOUNTER — Other Ambulatory Visit: Payer: Self-pay | Admitting: Allergy and Immunology

## 2024-07-02 DIAGNOSIS — J4550 Severe persistent asthma, uncomplicated: Secondary | ICD-10-CM

## 2024-07-07 ENCOUNTER — Other Ambulatory Visit (HOSPITAL_COMMUNITY): Payer: Self-pay | Admitting: Psychiatry

## 2024-07-07 DIAGNOSIS — F331 Major depressive disorder, recurrent, moderate: Secondary | ICD-10-CM

## 2024-07-07 DIAGNOSIS — F1010 Alcohol abuse, uncomplicated: Secondary | ICD-10-CM

## 2024-07-07 DIAGNOSIS — F4312 Post-traumatic stress disorder, chronic: Secondary | ICD-10-CM

## 2024-07-07 DIAGNOSIS — F1210 Cannabis abuse, uncomplicated: Secondary | ICD-10-CM

## 2024-09-11 ENCOUNTER — Other Ambulatory Visit: Payer: Self-pay | Admitting: *Deleted

## 2024-09-11 ENCOUNTER — Telehealth: Payer: Self-pay | Admitting: Allergy and Immunology

## 2024-09-11 MED ORDER — NEBULIZER/TUBING/MOUTHPIECE KIT: PACK | 0 refills | Status: AC

## 2024-09-11 NOTE — Telephone Encounter (Signed)
 Is it okay to send in a nebulizer for the patient?

## 2024-09-11 NOTE — Telephone Encounter (Signed)
 Patient called requesting a nebulizer machine, that hers went out and is not able to take her medication with out it. To please send it to her pharmacy or call her to let her know where to pick it up.

## 2024-09-11 NOTE — Telephone Encounter (Signed)
Prescription has been sent in. Called patient and informed. Patient verbalized understanding.

## 2024-09-12 NOTE — Telephone Encounter (Signed)
 Pt is calling stating that her pharmacy doesn't carry the nebulizer machine so she needs to know where she can get one that is compatible with her insurance. Pharmacy told her maybe a [facility] pharmacy.

## 2024-10-14 ENCOUNTER — Other Ambulatory Visit: Payer: Self-pay | Admitting: *Deleted

## 2024-10-14 ENCOUNTER — Telehealth: Payer: Self-pay | Admitting: Allergy and Immunology

## 2024-10-14 DIAGNOSIS — J4550 Severe persistent asthma, uncomplicated: Secondary | ICD-10-CM

## 2024-10-14 MED ORDER — DULERA 200-5 MCG/ACT IN AERO: 2.0000 | INHALATION_SPRAY | Freq: Two times a day (BID) | RESPIRATORY_TRACT | 5 refills | Status: AC

## 2024-10-14 MED ORDER — ALBUTEROL SULFATE HFA 108 (90 BASE) MCG/ACT IN AERS: 2.0000 | INHALATION_SPRAY | RESPIRATORY_TRACT | 1 refills | Status: AC | PRN

## 2024-10-14 MED ORDER — MONTELUKAST SODIUM 10 MG PO TABS: 10.0000 mg | ORAL_TABLET | Freq: Every day | ORAL | 5 refills | Status: AC

## 2024-10-14 MED ORDER — MOMETASONE FUROATE 0.1 % EX OINT: TOPICAL_OINTMENT | Freq: Every day | CUTANEOUS | 1 refills | Status: AC

## 2024-10-14 MED ORDER — ESOMEPRAZOLE MAGNESIUM 40 MG PO CPDR: 40.0000 mg | DELAYED_RELEASE_CAPSULE | Freq: Every morning | ORAL | 5 refills | Status: AC

## 2024-10-14 MED ORDER — FAMOTIDINE 40 MG PO TABS: 40.0000 mg | ORAL_TABLET | Freq: Every day | ORAL | 5 refills | Status: AC

## 2024-10-14 MED ORDER — LORATADINE 10 MG PO TABS: 10.0000 mg | ORAL_TABLET | Freq: Every day | ORAL | 5 refills | Status: AC

## 2024-10-14 MED ORDER — CROMOLYN SODIUM 4 % OP SOLN: 2.0000 [drp] | Freq: Four times a day (QID) | OPHTHALMIC | 5 refills | Status: AC | PRN

## 2024-10-14 MED ORDER — FLUTICASONE PROPIONATE 50 MCG/ACT NA SUSP: 2.0000 | Freq: Every morning | NASAL | 5 refills | Status: AC

## 2024-10-14 NOTE — Telephone Encounter (Signed)
 PT called requesting refills on all her medications and to please give her a call when they are sent to the pharmacy

## 2024-10-14 NOTE — Telephone Encounter (Signed)
 Prescriptions have been sent in to requested pharmacy. Called patient and informed. Patient verbalized understanding.
